# Patient Record
Sex: Male | Born: 1960
Health system: Southern US, Community
[De-identification: ages and names within clinical notes are randomized; demographics above are authoritative.]

## PROBLEM LIST (undated history)

## (undated) DIAGNOSIS — R112 Nausea with vomiting, unspecified: Secondary | ICD-10-CM

## (undated) DIAGNOSIS — Z87442 Personal history of urinary calculi: Secondary | ICD-10-CM

## (undated) DIAGNOSIS — T8859XA Other complications of anesthesia, initial encounter: Secondary | ICD-10-CM

## (undated) DIAGNOSIS — Z9889 Other specified postprocedural states: Secondary | ICD-10-CM

## (undated) HISTORY — PX: OTHER SURGICAL HISTORY: SHX169

## (undated) HISTORY — PX: TONSILLECTOMY: SUR1361

---

## 2012-02-18 ENCOUNTER — Encounter: Payer: Self-pay | Admitting: Sports Medicine

## 2012-02-18 ENCOUNTER — Ambulatory Visit (INDEPENDENT_AMBULATORY_CARE_PROVIDER_SITE_OTHER): Payer: BC Managed Care – PPO | Admitting: Sports Medicine

## 2012-02-18 VITALS — BP 127/88 | HR 101 | Ht 70.0 in | Wt 252.0 lb

## 2012-02-18 DIAGNOSIS — B356 Tinea cruris: Secondary | ICD-10-CM

## 2012-02-18 DIAGNOSIS — L255 Unspecified contact dermatitis due to plants, except food: Secondary | ICD-10-CM

## 2012-02-18 DIAGNOSIS — Z139 Encounter for screening, unspecified: Secondary | ICD-10-CM

## 2012-02-18 DIAGNOSIS — Z0000 Encounter for general adult medical examination without abnormal findings: Secondary | ICD-10-CM | POA: Insufficient documentation

## 2012-02-18 DIAGNOSIS — M48061 Spinal stenosis, lumbar region without neurogenic claudication: Secondary | ICD-10-CM | POA: Insufficient documentation

## 2012-02-18 DIAGNOSIS — M5412 Radiculopathy, cervical region: Secondary | ICD-10-CM

## 2012-02-18 DIAGNOSIS — N529 Male erectile dysfunction, unspecified: Secondary | ICD-10-CM | POA: Insufficient documentation

## 2012-02-18 DIAGNOSIS — M5416 Radiculopathy, lumbar region: Secondary | ICD-10-CM

## 2012-02-18 DIAGNOSIS — IMO0002 Reserved for concepts with insufficient information to code with codable children: Secondary | ICD-10-CM

## 2012-02-18 DIAGNOSIS — R7989 Other specified abnormal findings of blood chemistry: Secondary | ICD-10-CM

## 2012-02-18 DIAGNOSIS — E291 Testicular hypofunction: Secondary | ICD-10-CM

## 2012-02-18 DIAGNOSIS — L237 Allergic contact dermatitis due to plants, except food: Secondary | ICD-10-CM | POA: Insufficient documentation

## 2012-02-18 MED ORDER — TADALAFIL 5 MG PO TABS: 5.0000 mg | ORAL_TABLET | Freq: Every day | ORAL | Status: DC | PRN

## 2012-02-18 MED ORDER — TERBINAFINE HCL 250 MG PO TABS: 250.0000 mg | ORAL_TABLET | Freq: Every day | ORAL | Status: DC

## 2012-02-18 MED ORDER — TESTOSTERONE 30 MG/ACT TD SOLN: 60.0000 mg | Freq: Every day | TRANSDERMAL | Status: DC

## 2012-02-18 MED ORDER — PREDNISONE 50 MG PO TABS: ORAL_TABLET | ORAL | Status: AC

## 2012-02-18 MED ORDER — GABAPENTIN 300 MG PO CAPS: ORAL_CAPSULE | ORAL | Status: DC

## 2012-02-18 NOTE — Assessment & Plan Note (Signed)
I've refilled his Axiron. I'm also checking a testosterone level, as well as complete metabolic panel.

## 2012-02-18 NOTE — Assessment & Plan Note (Signed)
That is about this. He'll come back to see me on an as-needed basis for this.

## 2012-02-18 NOTE — Assessment & Plan Note (Signed)
We'll await MRI results, in preparation for transforaminal injection planning. I've given him home rehabilitation exercises to do in the meantime. The prednisone should also help in the meantime.

## 2012-02-18 NOTE — Assessment & Plan Note (Signed)
Patient desires blood work including CBC, complete metabolic panel, we discussed the risks and benefits, as well as the limitations of the PSA test, and he would like to proceed with this. He is up-to-date on his colonoscopy. He is unsure as to whether he's had chickenpox as a child, he is considering the zoster vaccine, we will check varicella-zoster virus titers. Checking lipid panel.

## 2012-02-18 NOTE — Patient Instructions (Addendum)
Great to meet you.  Bloodwork tomorrow. Get your new medications. Come back to see me in 4-6 weeks. Remember to get your MRI records.  Ihor Austin. Benjamin Stain, M.D.

## 2012-02-18 NOTE — Assessment & Plan Note (Signed)
Likely related to his hypogonadism. He's had good response with Cialis. I will refill this for him. He will come back to see me on an as-needed basis for this.

## 2012-02-18 NOTE — Progress Notes (Signed)
Subjective:   CC: Establish care with a new primary physician. Would also like to discuss several issues.  HPI: Richard Gross is a very pleasant 51 year old male who was recently moved to the area, and needs a new primary physician. He would also like to discuss:  Rash on left arm: Present for approximately one month, after working in the yard. He saw another physician, and received attending Dosepak of prednisone. This worked, however after the prednisone finished, the rash cleared up, he developed an additional rash on his left-sided abdomen. This is predominantly itchy.  Left arm numbness and tingling: Present for approximately one year. Comes down his right arm. He is been to an orthopedist, and has an MRI, and injection recommended. He has not yet follow through with this.  Back pain with right-sided leg numbness and tingling: Also present for approximately one year after swinging a golf club. He then developed intense pain in his right-sided low back radiating down the posterior aspect of his right leg down to the lateral aspect of his right foot. This corresponds to right-sided S1 radicular distribution. He has not had any formal treatment for this.  Low testosterone: Has been using topical testosterone prescribed by another physician. Unfortunately he's had a several month hiatus. He desires a refill, as well as to have his testosterone  Recheck.  Jock itch: Present for several weeks, has been using Lotrimin ultra-without much help. He is desiring additional option.  Preventive medicine: Would like to have some routine blood work done, including a PSA. He would also like to discuss zoster vaccine, but is unsure if he's had chickenpox in the past.   Past medical history: Low testosterone, erectile dysfunction. Surgical history: None. Family history: Positive for cancer, heart attacks, diabetes, hyperlipidemia, hypertension. Social history: Works as an Community education officer. Quit smoking in 1997,  denies use of alcohol or drugs. Allergies, and medications reviewed from the medical record and no changes needed.  Review of Systems: No fevers, chills, night sweats, weight loss, chest pain, or shortness of breath.    Objective:  General:  Well Developed, well nourished, and in no acute distress. Neuro:  Alert and oriented x3, extra-ocular muscles intact. Skin: Warm and dry, tinea cruris noted. He also has some residual signs of poison ivy dermatitis on his left arm. He also has a large, red macule his left abdomen that blanches. Cardiac: Regular rate and rhythm, no murmurs rubs or gallops. Respiratory:  Clear to auscultation bilaterally. Not using accessory muscles, speaking in full sentences.  Neck: Inspection unremarkable. No palpable stepoffs. Negative Spurling's maneuver. Full neck range of motion Grip strength and sensation normal in bilateral hands Strength good C4 to T1 distribution No sensory change to C4 to T1 Reflexes normal  Back Exam: Inspection: Unremarkable Motion: Flexion 45 deg, Extension 45 deg, Side Bending to 45 deg bilaterally,  Rotation to 45 deg bilaterally SLR laying:  Negative XSLR laying: Negative Palpable tenderness: None FABER: negative Sensory change: Gross sensation intact to all lumbar and sacral dermatomes. Reflexes: 2+ at both patellar tendons, 2+ at achilles tendons, Babinski's downgoing.  Strength at foot Plantar-flexion: 5/5    Dorsi-flexion: 5/5    Eversion: 5/5   Inversion: 5/5 Leg strength Quad: 5/5   Hamstring: 5/5   Hip flexor: 5/5   Hip abductors: 5/5 Gait unremarkable.   Assessment & Plan:

## 2012-02-18 NOTE — Assessment & Plan Note (Signed)
Likely based on combination of symptoms, duration for over year. Prednisone should help some of this. I've given him some rehabilitation exercises which he should do on a daily basis. Like to see him back in 4 weeks. I've also started on Neurontin taper. When he brings me the results of his MRI, at that point we can determine the affected level in preparation for transforaminal epidural injections.

## 2012-02-18 NOTE — Assessment & Plan Note (Signed)
Minimal, to no benefit with topical Lamisil. Checking a complete metabolic panel, and I will start him on oral terbinafine. We will do this for at least 2 weeks, but possibly up to 4 weeks.

## 2012-02-19 LAB — PSA, TOTAL AND FREE
PSA, Free Pct: 21 % — ABNORMAL LOW (ref >25)
PSA, Free: 0.16 ng/mL
PSA: 0.76 ng/mL (ref <=4.00)

## 2012-02-19 LAB — CBC WITH DIFFERENTIAL/PLATELET
Basophils Absolute: 0 10*3/uL (ref 0.0–0.1)
Basophils Relative: 1 % (ref 0–1)
Eosinophils Absolute: 0.2 10*3/uL (ref 0.0–0.7)
Eosinophils Relative: 5 % (ref 0–5)
HCT: 43.4 % (ref 39.0–52.0)
Hemoglobin: 15.3 g/dL (ref 13.0–17.0)
Lymphocytes Relative: 18 % (ref 12–46)
Lymphs Abs: 0.9 K/uL (ref 0.7–4.0)
MCH: 30.4 pg (ref 26.0–34.0)
MCHC: 35.3 g/dL (ref 30.0–36.0)
MCV: 86.3 fL (ref 78.0–100.0)
Monocytes Absolute: 0.2 K/uL (ref 0.1–1.0)
Monocytes Relative: 4 % (ref 3–12)
Neutro Abs: 3.7 10*3/uL (ref 1.7–7.7)
Neutrophils Relative %: 72 % (ref 43–77)
Platelets: 204 K/uL (ref 150–400)
RBC: 5.03 MIL/uL (ref 4.22–5.81)
RDW: 14 % (ref 11.5–15.5)
WBC: 5 K/uL (ref 4.0–10.5)

## 2012-02-19 LAB — COMPREHENSIVE METABOLIC PANEL
ALT: 37 U/L (ref 0–53)
AST: 17 U/L (ref 0–37)
Albumin: 4.3 g/dL (ref 3.5–5.2)
Alkaline Phosphatase: 68 U/L (ref 39–117)
BUN: 22 mg/dL (ref 6–23)
Calcium: 9.2 mg/dL (ref 8.4–10.5)
Chloride: 107 mEq/L (ref 96–112)
Potassium: 4.5 mEq/L (ref 3.5–5.3)
Sodium: 140 mEq/L (ref 135–145)
Total Protein: 6 g/dL (ref 6.0–8.3)

## 2012-02-19 LAB — LIPID PANEL
Cholesterol: 233 mg/dL — ABNORMAL HIGH (ref 0–200)
HDL: 33 mg/dL — ABNORMAL LOW (ref >39)
LDL Cholesterol: 124 mg/dL — ABNORMAL HIGH (ref 0–99)
Total CHOL/HDL Ratio: 7.1 Ratio
Triglycerides: 378 mg/dL — ABNORMAL HIGH (ref <150)
VLDL: 76 mg/dL — ABNORMAL HIGH (ref 0–40)

## 2012-02-19 LAB — COMPREHENSIVE METABOLIC PANEL WITH GFR
CO2: 27 meq/L (ref 19–32)
Creat: 0.8 mg/dL (ref 0.50–1.35)
Glucose, Bld: 102 mg/dL — ABNORMAL HIGH (ref 70–99)
Total Bilirubin: 0.4 mg/dL (ref 0.3–1.2)

## 2012-02-20 ENCOUNTER — Encounter: Payer: Self-pay | Admitting: Sports Medicine

## 2012-02-20 LAB — TESTOSTERONE, FREE, TOTAL, SHBG
Sex Hormone Binding: 49 nmol/L (ref 13–71)
Testosterone, Free: 48.3 pg/mL (ref 47.0–244.0)
Testosterone-% Free: 1.5 % — ABNORMAL LOW (ref 1.6–2.9)
Testosterone: 316.1 ng/dL (ref 300–890)

## 2012-02-21 ENCOUNTER — Encounter: Payer: Self-pay | Admitting: Sports Medicine

## 2012-03-24 ENCOUNTER — Ambulatory Visit (INDEPENDENT_AMBULATORY_CARE_PROVIDER_SITE_OTHER): Payer: BC Managed Care – PPO | Admitting: Sports Medicine

## 2012-03-24 ENCOUNTER — Encounter: Payer: Self-pay | Admitting: Sports Medicine

## 2012-03-24 VITALS — BP 114/70 | HR 84 | Temp 97.4°F | Wt 257.0 lb

## 2012-03-24 DIAGNOSIS — Z0000 Encounter for general adult medical examination without abnormal findings: Secondary | ICD-10-CM

## 2012-03-24 DIAGNOSIS — M5412 Radiculopathy, cervical region: Secondary | ICD-10-CM

## 2012-03-24 DIAGNOSIS — M5416 Radiculopathy, lumbar region: Secondary | ICD-10-CM

## 2012-03-24 DIAGNOSIS — E785 Hyperlipidemia, unspecified: Secondary | ICD-10-CM

## 2012-03-24 DIAGNOSIS — IMO0002 Reserved for concepts with insufficient information to code with codable children: Secondary | ICD-10-CM

## 2012-03-24 DIAGNOSIS — E291 Testicular hypofunction: Secondary | ICD-10-CM

## 2012-03-24 MED ORDER — SIMVASTATIN 40 MG PO TABS: 40.0000 mg | ORAL_TABLET | Freq: Every day | ORAL | Status: DC

## 2012-03-24 NOTE — Assessment & Plan Note (Addendum)
MRI was done previously at [facility], [place]. We will obtain these results, and I would use them for interventional injection planning.

## 2012-03-24 NOTE — Assessment & Plan Note (Signed)
Starting simvastatin 40 mg daily. He will come back to see me in 3 months to recheck.

## 2012-03-24 NOTE — Assessment & Plan Note (Signed)
Awaiting prior authorization for Axiron.

## 2012-03-24 NOTE — Assessment & Plan Note (Addendum)
MRI was done previously at [facility], [place]. Again, we'll obtain MRI results, for interventional injection planning.

## 2012-03-24 NOTE — Assessment & Plan Note (Signed)
The lab missed his VZV antibody. When he gets his lipid panel, I would like also draw his varicella-zoster antibody at no charge.

## 2012-03-24 NOTE — Progress Notes (Signed)
Patient ID: Richard Gross, male   DOB: Jul 15, 1961, 51 y.o.   MRN: [id number] Subjective:    CC: Followup  HPI:  Hypogonadism:  Axiron in the past, we are currently getting prior authorization for this.  Hyperlipidemia: Was on TriCor in the past.  Poison ivy dermatitis: Resolved.  Tinea cruris: Resolved with Lamisil.  Cervical, and lumbar radiculopathy:  Currently still awaiting results from his cervical and lumbar MRIs.  The gabapentin is helping, he is currently only on 300 mg 3 times a day. He will continue his up taper, he is unsure whether he is getting some drowsiness.   Past medical history, Surgical history, Family history, Social history, Allergies, and medications have been entered into the medical record, reviewed, and no changes needed.   Review of Systems: No fevers, chills, night sweats, weight loss, chest pain, or shortness of breath.   Objective:    General: Well Developed, well nourished, and in no acute distress.  Neuro: Alert and oriented x3, extra-ocular muscles intact.  HEENT: Normocephalic, atraumatic, pupils equal round reactive to light, neck supple, no masses, no lymphadenopathy, thyroid nonpalpable.  Skin: Warm and dry, no rashes. Cardiac: Regular rate and rhythm, no murmurs rubs or gallops.  Respiratory: Clear to auscultation bilaterally. Not using accessory muscles, speaking in full sentences.  Impression and Recommendations:

## 2012-03-25 ENCOUNTER — Telehealth: Payer: Self-pay

## 2012-03-25 NOTE — Telephone Encounter (Signed)
I have faxed over the medical release forms to both offices.

## 2012-03-25 NOTE — Telephone Encounter (Signed)
FYI  Dr Benjamin Stain I am still working on the MRI reports and Disks for Mr. Knoble. See note below.   I called Jaishon and asked him if he has signed a Medical record release form. He is not sure if he has signed a release form.  I am unable to get the MRI report and disk from [facility], [facility] or MRI from [facility], [place] without a release.   [facility] phone number is [phone] and fax is [phone]  [facility] phone number is [phone] and fax is [phone]

## 2012-03-25 NOTE — Telephone Encounter (Signed)
I spoke with Richard Gross and he stated he would come by to sign a release.

## 2012-03-25 NOTE — Telephone Encounter (Signed)
Opened in error

## 2012-03-26 ENCOUNTER — Telehealth: Payer: Self-pay

## 2012-03-26 NOTE — Telephone Encounter (Signed)
Awaiting baseline testosterone serum results. This lab is needed for PA on the Axiron. Paperwork in a file on my desk.

## 2012-03-31 ENCOUNTER — Other Ambulatory Visit: Payer: Self-pay | Admitting: *Deleted

## 2012-03-31 ENCOUNTER — Encounter: Payer: Self-pay | Admitting: Sports Medicine

## 2012-03-31 MED ORDER — MELOXICAM 15 MG PO TABS: ORAL_TABLET | ORAL | Status: DC

## 2012-03-31 NOTE — Telephone Encounter (Signed)
LMOM

## 2012-03-31 NOTE — Telephone Encounter (Signed)
Is it ok to refill pt's Ibuprofen 300mg  daily? We have never filled it before for him.

## 2012-03-31 NOTE — Telephone Encounter (Signed)
Let's do MOBIC instead. I will call this in.

## 2012-06-20 ENCOUNTER — Telehealth: Payer: Self-pay | Admitting: *Deleted

## 2012-06-20 ENCOUNTER — Other Ambulatory Visit: Payer: Self-pay | Admitting: Sports Medicine

## 2012-06-20 DIAGNOSIS — Z0184 Encounter for antibody response examination: Secondary | ICD-10-CM

## 2012-06-20 DIAGNOSIS — E785 Hyperlipidemia, unspecified: Secondary | ICD-10-CM

## 2012-06-20 LAB — LIPID PANEL
Cholesterol: 137 mg/dL (ref 0–200)
HDL: 34 mg/dL — ABNORMAL LOW (ref >39)
LDL Cholesterol: 56 mg/dL (ref 0–99)
Total CHOL/HDL Ratio: 4 ratio
Triglycerides: 236 mg/dL — ABNORMAL HIGH (ref <150)
VLDL: 47 mg/dL — ABNORMAL HIGH (ref 0–40)

## 2012-06-20 MED ORDER — NIACIN-SIMVASTATIN ER 1000-40 MG PO TB24: 1.0000 | ORAL_TABLET | Freq: Every day | ORAL | Status: DC

## 2012-06-20 NOTE — Assessment & Plan Note (Signed)
Triglycerides are still somewhat elevated, I'm going to keep him on simvastatin, but switch this to the combination simvastatin/niacin combination. We will recheck lipids in 6 weeks.

## 2012-06-23 ENCOUNTER — Encounter: Payer: Self-pay | Admitting: Sports Medicine

## 2012-06-23 ENCOUNTER — Ambulatory Visit (INDEPENDENT_AMBULATORY_CARE_PROVIDER_SITE_OTHER): Payer: BC Managed Care – PPO | Admitting: Sports Medicine

## 2012-06-23 VITALS — BP 137/83 | HR 85 | Wt 262.0 lb

## 2012-06-23 DIAGNOSIS — M5412 Radiculopathy, cervical region: Secondary | ICD-10-CM

## 2012-06-23 DIAGNOSIS — E291 Testicular hypofunction: Secondary | ICD-10-CM

## 2012-06-23 DIAGNOSIS — IMO0002 Reserved for concepts with insufficient information to code with codable children: Secondary | ICD-10-CM

## 2012-06-23 DIAGNOSIS — M5416 Radiculopathy, lumbar region: Secondary | ICD-10-CM

## 2012-06-23 DIAGNOSIS — E785 Hyperlipidemia, unspecified: Secondary | ICD-10-CM

## 2012-06-23 LAB — VARICELLA ZOSTER ANTIBODY, IGG: Varicella IgG: 4.75 {ISR} — ABNORMAL HIGH (ref <0.90)

## 2012-06-23 MED ORDER — CYCLOBENZAPRINE HCL 10 MG PO TABS: ORAL_TABLET | ORAL | Status: DC

## 2012-06-23 NOTE — Assessment & Plan Note (Signed)
He has an MRI, we do not have the results or the disc yet. I would like him to do some formal physical therapy, and continue gabapentin, as well as Flexeril at night. We will see him back after 4 weeks, and we'll hopefully obtain MRI results by then.

## 2012-06-23 NOTE — Progress Notes (Signed)
Subjective:    CC: Followup  HPI: Male hypogonadism: has been on Axiron in the past, and had excellent response. Most recent testosterone levels were in the 300s. We have been working on a prior authorization, however she has not yet been able to secure any more Axiron.  Hyperlipidemia: Has been on simvastatin 40, most recent lipid panel showed high triglycerides, and low HDL.  Erectile dysfunction: Has been related to hypogonadism, and was better when he was on testosterone supplementation.  Cervical radiculopathy colon we did review his MRI, he does have a left-sided C6-C7 foraminal disc herniation.  He's been doing very well with gabapentin, however occasionally still wakes up with a stiff neck.  Lumbar radiculitis: We have still not yet gotten results of his lumbar spine MRI. The gabapentin does not improve his pain.  Past medical history, Surgical history, Family history, Social history, Allergies, and medications have been entered into the medical record, reviewed, and no changes needed.   Review of Systems: No fevers, chills, night sweats, weight loss, chest pain, or shortness of breath.   Objective:    General: Well Developed, well nourished, and in no acute distress.  Neuro: Alert and oriented x3, extra-ocular muscles intact.  HEENT: Normocephalic, atraumatic, pupils equal round reactive to light, neck supple, no masses, no lymphadenopathy, thyroid nonpalpable.  Skin: Warm and dry, no rashes. Cardiac: Regular rate and rhythm, no murmurs rubs or gallops.  Respiratory: Clear to auscultation bilaterally. Not using accessory muscles, speaking in full sentences.  Impression and Recommendations:

## 2012-06-23 NOTE — Assessment & Plan Note (Signed)
HDL low, LDL normal, triglycerides high. I am switching him to simvastatin/niacin combination. We'll recheck his lipids in 6 weeks.

## 2012-06-23 NOTE — Assessment & Plan Note (Signed)
He is doing well with gabapentin. I am going to add cyclobenzaprine one tablet bedtime. He has been waking up with a stiff neck.

## 2012-06-23 NOTE — Assessment & Plan Note (Signed)
Still awaiting prior authorization for Axiron.

## 2012-06-25 ENCOUNTER — Ambulatory Visit: Payer: BC Managed Care – PPO | Admitting: Physical Therapy

## 2012-06-27 ENCOUNTER — Telehealth: Payer: Self-pay | Admitting: *Deleted

## 2012-06-27 NOTE — Telephone Encounter (Signed)
Spoke with pt and he states he will have his wife to contact them to send a disk.

## 2012-06-27 NOTE — Telephone Encounter (Signed)
Have tried calling [facility] in [place] to get a copy of the pt's lumbar spine MRI. Have been unsuccessful in getting return calls. Will call pt and ask him to give them a call.

## 2012-07-31 ENCOUNTER — Other Ambulatory Visit: Payer: Self-pay | Admitting: Sports Medicine

## 2012-09-09 ENCOUNTER — Encounter: Payer: Self-pay | Admitting: Sports Medicine

## 2012-09-09 ENCOUNTER — Other Ambulatory Visit: Payer: Self-pay | Admitting: Sports Medicine

## 2012-09-09 ENCOUNTER — Ambulatory Visit (INDEPENDENT_AMBULATORY_CARE_PROVIDER_SITE_OTHER): Payer: BC Managed Care – PPO | Admitting: Sports Medicine

## 2012-09-09 VITALS — BP 127/83 | HR 99 | Temp 98.2°F | Wt 266.0 lb

## 2012-09-09 DIAGNOSIS — J0100 Acute maxillary sinusitis, unspecified: Secondary | ICD-10-CM

## 2012-09-09 DIAGNOSIS — E785 Hyperlipidemia, unspecified: Secondary | ICD-10-CM

## 2012-09-09 MED ORDER — PREDNISONE 50 MG PO TABS: 50.0000 mg | ORAL_TABLET | Freq: Every day | ORAL | Status: DC

## 2012-09-09 MED ORDER — AZITHROMYCIN 500 MG PO TABS: 500.0000 mg | ORAL_TABLET | Freq: Every day | ORAL | Status: DC

## 2012-09-09 NOTE — Assessment & Plan Note (Signed)
Azithromycin three-day per patient request. Prednisone instead of Flonase per patient request. Return as needed.

## 2012-09-09 NOTE — Assessment & Plan Note (Signed)
Still awaiting fasting lipids. She will come in at his next available opportunity to have blood drawn.

## 2012-09-09 NOTE — Progress Notes (Signed)
Subjective:    CC: Sick  HPI:  Sinusitis:  Presten comes in he said several days of cough productive of yellowish sputum, pain and pressure over his maxillary sinuses without radiation to the teeth or ears, generalized malaise and fatigue. He does have a trip coming up and would like does knocked out. He denies shortness of breath, wheeze. Denies GI symptoms or rash.  Hyperlipidemia:  Due for recheck.  Past medical history, Surgical history, Family history not pertinant except as noted below, Social history, Allergies, and medications have been entered into the medical record, reviewed, and no changes needed.   Review of Systems: No fevers, chills, night sweats, weight loss, chest pain, or shortness of breath.   Objective:    General: Well Developed, well nourished, and in no acute distress.  Neuro: Alert and oriented x3, extra-ocular muscles intact, sensation grossly intact.  HEENT: Normocephalic, atraumatic, pupils equal round reactive to light, neck supple, no masses, no lymphadenopathy, thyroid nonpalpable. Tender to palpation of the maxillary sinuses, oropharynx, nasopharynx, external ear canals are unremarkable to inspection Skin: Warm and dry, no rashes. Cardiac: Regular rate and rhythm, no murmurs rubs or gallops.  Respiratory: Clear to auscultation bilaterally. Not using accessory muscles, speaking in full sentences. Impression and Recommendations:

## 2012-10-10 LAB — LIPID PANEL
Cholesterol: 133 mg/dL (ref 0–200)
HDL: 34 mg/dL — ABNORMAL LOW (ref >39)
LDL Cholesterol: 58 mg/dL (ref 0–99)
Total CHOL/HDL Ratio: 3.9 Ratio
Triglycerides: 207 mg/dL — ABNORMAL HIGH (ref <150)
VLDL: 41 mg/dL — ABNORMAL HIGH (ref 0–40)

## 2012-10-10 LAB — COMPREHENSIVE METABOLIC PANEL
AST: 22 U/L (ref 0–37)
Albumin: 4.2 g/dL (ref 3.5–5.2)
Alkaline Phosphatase: 56 U/L (ref 39–117)
BUN: 18 mg/dL (ref 6–23)
Potassium: 4.3 mEq/L (ref 3.5–5.3)
Sodium: 142 mEq/L (ref 135–145)
Total Bilirubin: 0.4 mg/dL (ref 0.3–1.2)

## 2012-10-10 LAB — COMPREHENSIVE METABOLIC PANEL WITH GFR
ALT: 36 U/L (ref 0–53)
CO2: 26 meq/L (ref 19–32)
Calcium: 9.2 mg/dL (ref 8.4–10.5)
Chloride: 107 meq/L (ref 96–112)
Creat: 0.93 mg/dL (ref 0.50–1.35)
Glucose, Bld: 94 mg/dL (ref 70–99)
Total Protein: 6 g/dL (ref 6.0–8.3)

## 2012-10-12 ENCOUNTER — Other Ambulatory Visit: Payer: Self-pay | Admitting: Sports Medicine

## 2012-10-12 DIAGNOSIS — E785 Hyperlipidemia, unspecified: Secondary | ICD-10-CM

## 2012-10-12 MED ORDER — NIACIN-SIMVASTATIN ER 1000-40 MG PO TB24: 2.0000 | ORAL_TABLET | Freq: Every day | ORAL | Status: DC

## 2012-10-13 LAB — TESTOSTERONE, FREE, TOTAL, SHBG
Sex Hormone Binding: 52 nmol/L (ref 13–71)
Testosterone, Free: 80.2 pg/mL (ref 47.0–244.0)
Testosterone-% Free: 1.6 % (ref 1.6–2.9)
Testosterone: 514.87 ng/dL (ref 300–890)

## 2012-12-01 ENCOUNTER — Encounter: Payer: Self-pay | Admitting: Sports Medicine

## 2012-12-01 ENCOUNTER — Encounter: Payer: Self-pay | Admitting: *Deleted

## 2012-12-01 ENCOUNTER — Ambulatory Visit (INDEPENDENT_AMBULATORY_CARE_PROVIDER_SITE_OTHER): Payer: BC Managed Care – PPO | Admitting: Sports Medicine

## 2012-12-01 VITALS — BP 137/86 | HR 94 | Wt 263.0 lb

## 2012-12-01 DIAGNOSIS — E669 Obesity, unspecified: Secondary | ICD-10-CM

## 2012-12-01 DIAGNOSIS — G510 Bell's palsy: Secondary | ICD-10-CM

## 2012-12-01 DIAGNOSIS — E785 Hyperlipidemia, unspecified: Secondary | ICD-10-CM

## 2012-12-01 MED ORDER — FENOFIBRATE 145 MG PO TABS: 145.0000 mg | ORAL_TABLET | Freq: Every day | ORAL | Status: DC

## 2012-12-01 MED ORDER — PREDNISONE 50 MG PO TABS: ORAL_TABLET | ORAL | Status: DC

## 2012-12-01 MED ORDER — ARTIFICIAL TEARS OP OINT: TOPICAL_OINTMENT | Freq: Every evening | OPHTHALMIC | Status: DC | PRN

## 2012-12-01 NOTE — Progress Notes (Signed)
  Subjective:    CC: Bell's palsy  HPI: Bell's palsy: approximately one week ago Richard Gross woke up with a droop on the right side of his face involving his forehead. He's never had symptoms like this before, however one of his family members did. He denies any visual changes, weakness or numbness on either side of his body, or difficulty hearing, or speaking. He did have upper respiratory symptoms approximately one week before. He went to an outside urgent care center where he was diagnosed with Bell's palsy, and given prednisone taper for 6 days.  Hyperlipidemia: Has muscle aches with simvastatin.  Obesity: Desires to start weight loss medication.  Past medical history, Surgical history, Family history not pertinant except as noted below, Social history, Allergies, and medications have been entered into the medical record, reviewed, and no changes needed.   Review of Systems: No fevers, chills, night sweats, weight loss, chest pain, or shortness of breath.   Objective:    General: Well Developed, well nourished, and in no acute distress.  Neuro: Alert and oriented x3, extra-ocular muscles intact, sensation grossly intact. There is right-sided facial droop involving the forehead, the rest of the cranial nerves are intact. Motor, sensory, coordinative functions are intact. HEENT: Normocephalic, atraumatic, pupils equal round reactive to light, neck supple, no masses, no lymphadenopathy, thyroid nonpalpable.  Skin: Warm and dry, no rashes. Cardiac: Regular rate and rhythm, no murmurs rubs or gallops, no lower extremity edema.  Respiratory: Clear to auscultation bilaterally. Not using accessory muscles, speaking in full sentences.  Impression and Recommendations:    I spent 40 minutes with this patient, greater than 50% was face-to-face time counseling regarding Bell's palsy.

## 2012-12-01 NOTE — Assessment & Plan Note (Signed)
Myalgias with simvastatin. Switching to fenofibrate. Recheck in 3 months.

## 2012-12-01 NOTE — Assessment & Plan Note (Signed)
Nutritionist visit. Exercise Prescription. Phentermine, but I will not prescribe this until the Bell's palsy is resolved.

## 2012-12-01 NOTE — Assessment & Plan Note (Signed)
Continue prednisone, I want him to pick up his taper after doing an additional 5 day burst. I've recommended an eye patch for use at night. Artificial tears. Return in 2-3 weeks. He understands this can take a few months to completely resolve. There are no signs of vesicles in his ears to suggest Ramsay Hunt syndrome.

## 2012-12-04 ENCOUNTER — Other Ambulatory Visit: Payer: Self-pay | Admitting: *Deleted

## 2012-12-04 MED ORDER — MELOXICAM 15 MG PO TABS: 15.0000 mg | ORAL_TABLET | Freq: Every day | ORAL | Status: DC | PRN

## 2012-12-15 ENCOUNTER — Other Ambulatory Visit: Payer: Self-pay

## 2012-12-15 MED ORDER — GABAPENTIN 300 MG PO CAPS: ORAL_CAPSULE | ORAL | Status: DC

## 2012-12-18 ENCOUNTER — Ambulatory Visit (INDEPENDENT_AMBULATORY_CARE_PROVIDER_SITE_OTHER): Payer: BC Managed Care – PPO | Admitting: Sports Medicine

## 2012-12-18 ENCOUNTER — Encounter: Payer: Self-pay | Admitting: Sports Medicine

## 2012-12-18 VITALS — BP 128/80 | HR 96 | Wt 263.0 lb

## 2012-12-18 DIAGNOSIS — M5412 Radiculopathy, cervical region: Secondary | ICD-10-CM

## 2012-12-18 DIAGNOSIS — E785 Hyperlipidemia, unspecified: Secondary | ICD-10-CM

## 2012-12-18 DIAGNOSIS — G510 Bell's palsy: Secondary | ICD-10-CM

## 2012-12-18 DIAGNOSIS — J0100 Acute maxillary sinusitis, unspecified: Secondary | ICD-10-CM | POA: Insufficient documentation

## 2012-12-18 MED ORDER — AZITHROMYCIN 250 MG PO TABS: ORAL_TABLET | ORAL | Status: DC

## 2012-12-18 MED ORDER — PREDNISONE 10 MG PO TABS: ORAL_TABLET | ORAL | Status: DC

## 2012-12-18 NOTE — Assessment & Plan Note (Signed)
Still with some numbness in the left arm and a C7 distribution. He does have a good response to gabapentin, and is likely going to go up on the dose. He's been taking a lot of medicines lately and would like to delay any further interventional treatment for 3-6 months.

## 2012-12-18 NOTE — Assessment & Plan Note (Signed)
Markedly improved 

## 2012-12-18 NOTE — Assessment & Plan Note (Signed)
Azithromycin, prednisone taper.

## 2012-12-18 NOTE — Progress Notes (Signed)
  Subjective:    CC: Followup  HPI: Bell's palsy: continues to improve on a daily basis after prednisone. He is doing eyedrops to prevent irritation of his cornea. Overall he is happy with the improvement.  Maxillary sinusitis: Pain is localized over the left maxillary sinus, associated with purulent nasal discharge, but no constitutional symptoms. Pain is localized, doesn't radiate, moderate. Has been present for several days.  Hyperlipidemia: Doing well and stable with fenofibrate.  Neck pain: Cervical radiculitis, left-sided, MRI in the past has shown C6-C7 disc protrusion the left side, doing very well with gabapentin.  Past medical history, Surgical history, Family history not pertinant except as noted below, Social history, Allergies, and medications have been entered into the medical record, reviewed, and no changes needed.   Review of Systems: No fevers, chills, night sweats, weight loss, chest pain, or shortness of breath.   Objective:    General: Well Developed, well nourished, and in no acute distress.  Neuro: Alert and oriented x3, extra-ocular muscles intact, sensation grossly intact.  HEENT: Normocephalic, atraumatic, pupils equal round reactive to light, neck supple, no masses, no lymphadenopathy, thyroid nonpalpable. Facial droop has improved significantly. He is tender over the maxillary sinus on the left side, oropharynx, nasopharynx, external ear canals unremarkable. Skin: Warm and dry, no rashes. Cardiac: Regular rate and rhythm, no murmurs rubs or gallops, no lower extremity edema.  Respiratory: Clear to auscultation bilaterally. Not using accessory muscles, speaking in full sentences.  Impression and Recommendations:

## 2012-12-18 NOTE — Assessment & Plan Note (Signed)
No problems at all on fenofibrate.

## 2012-12-26 ENCOUNTER — Ambulatory Visit: Payer: BC Managed Care – PPO | Admitting: Sports Medicine

## 2013-01-26 ENCOUNTER — Telehealth: Payer: Self-pay

## 2013-01-26 NOTE — Telephone Encounter (Signed)
Pt called stated that he was referred to nutrientist he contacted his insurance they do not cover out patient it has to be inpatient with OV.  He wants to know if you can give him another referral. Richard Gross

## 2013-01-30 ENCOUNTER — Ambulatory Visit: Payer: BC Managed Care – PPO | Admitting: Dietician

## 2013-03-11 ENCOUNTER — Other Ambulatory Visit: Payer: Self-pay | Admitting: Sports Medicine

## 2013-03-20 ENCOUNTER — Ambulatory Visit: Payer: BC Managed Care – PPO | Admitting: Sports Medicine

## 2013-03-20 DIAGNOSIS — Z0289 Encounter for other administrative examinations: Secondary | ICD-10-CM

## 2013-03-26 ENCOUNTER — Telehealth: Payer: Self-pay

## 2013-03-26 DIAGNOSIS — G510 Bell's palsy: Secondary | ICD-10-CM

## 2013-03-26 NOTE — Telephone Encounter (Signed)
Patient called wants to know if you can add an order to his labs so he can check to see what his blood type is. Claudean Leavelle,CMA

## 2013-03-26 NOTE — Telephone Encounter (Signed)
Patient called he is getting labs done in the morning he want you to add a test to check to see what is blood type is. Jenney Brester,CMA

## 2013-03-26 NOTE — Telephone Encounter (Signed)
Done, labs ordered

## 2013-03-27 ENCOUNTER — Other Ambulatory Visit: Payer: Self-pay | Admitting: Sports Medicine

## 2013-03-27 LAB — LIPID PANEL
Cholesterol: 186 mg/dL (ref 0–200)
HDL: 38 mg/dL — ABNORMAL LOW (ref >39)
LDL Cholesterol: 111 mg/dL — ABNORMAL HIGH (ref 0–99)
Total CHOL/HDL Ratio: 4.9 ratio
Triglycerides: 185 mg/dL — ABNORMAL HIGH (ref <150)
VLDL: 37 mg/dL (ref 0–40)

## 2013-03-27 MED ORDER — FENOFIBRATE 160 MG PO TABS: 160.0000 mg | ORAL_TABLET | Freq: Every day | ORAL | Status: DC

## 2013-03-27 MED ORDER — OMEGA-3-ACID ETHYL ESTERS 1 G PO CAPS: 2.0000 g | ORAL_CAPSULE | Freq: Two times a day (BID) | ORAL | Status: DC

## 2013-03-28 LAB — ABO AND RH: Rh Type: POSITIVE

## 2013-04-03 ENCOUNTER — Ambulatory Visit (INDEPENDENT_AMBULATORY_CARE_PROVIDER_SITE_OTHER): Payer: BC Managed Care – PPO | Admitting: Sports Medicine

## 2013-04-03 ENCOUNTER — Encounter: Payer: Self-pay | Admitting: Sports Medicine

## 2013-04-03 VITALS — BP 130/83 | HR 107 | Wt 269.0 lb

## 2013-04-03 DIAGNOSIS — M5416 Radiculopathy, lumbar region: Secondary | ICD-10-CM

## 2013-04-03 DIAGNOSIS — E669 Obesity, unspecified: Secondary | ICD-10-CM

## 2013-04-03 DIAGNOSIS — IMO0002 Reserved for concepts with insufficient information to code with codable children: Secondary | ICD-10-CM

## 2013-04-03 DIAGNOSIS — E785 Hyperlipidemia, unspecified: Secondary | ICD-10-CM

## 2013-04-03 DIAGNOSIS — G510 Bell's palsy: Secondary | ICD-10-CM

## 2013-04-03 DIAGNOSIS — M5412 Radiculopathy, cervical region: Secondary | ICD-10-CM

## 2013-04-03 DIAGNOSIS — B351 Tinea unguium: Secondary | ICD-10-CM

## 2013-04-03 MED ORDER — TERBINAFINE HCL 250 MG PO TABS: 250.0000 mg | ORAL_TABLET | Freq: Every day | ORAL | Status: DC

## 2013-04-03 MED ORDER — PHENTERMINE HCL 37.5 MG PO TABS: 37.5000 mg | ORAL_TABLET | Freq: Every day | ORAL | Status: DC

## 2013-04-03 MED ORDER — MAGNESIUM OXIDE 400 MG PO TABS: 800.0000 mg | ORAL_TABLET | Freq: Every day | ORAL | Status: DC

## 2013-04-03 NOTE — Assessment & Plan Note (Signed)
Recently increased fenofibrate to 160 mg, rechecking lipids in 3 months.

## 2013-04-03 NOTE — Assessment & Plan Note (Signed)
Lamisil for 3-6 months, if no better we can remove the entire toenail.

## 2013-04-03 NOTE — Assessment & Plan Note (Signed)
Resolved

## 2013-04-03 NOTE — Assessment & Plan Note (Signed)
Phentermine. He was unable to afford nutritionist visits. Return monthly for refills and weight checks

## 2013-04-03 NOTE — Assessment & Plan Note (Signed)
Patient will obtain MRI of his lumbar spine for epidural planning. I am going to give him some magnesium. Continue gabapentin. If no improvement and to pursue epidural, he does have right-sided likely L5 versus S1 involvement.

## 2013-04-03 NOTE — Progress Notes (Signed)
  Subjective:    CC: Followup  HPI: Bell's palsy: resolved.  Cervical radiculopathy : we reviewed his MRI, he is still steroids, physical therapy, NSAIDs. At this point he does desire to proceed with interventional treatment.  Hypertriglyceridemia: Improved with fenofibrate, recently increased to 160 mg, no changes.  Blood type: He was asking for this to be checked, he is thinking about donating his liver to his brother-in-law.  Toenail thickening: fungal, would like me to treat this.  Obesity: Tried to get in with the nutritionist but this was too expensive for him, wondering if he can still do phentermine.  Past medical history, Surgical history, Family history not pertinant except as noted below, Social history, Allergies, and medications have been entered into the medical record, reviewed, and no changes needed.   Review of Systems: No fevers, chills, night sweats, weight loss, chest pain, or shortness of breath.   Objective:    General: Well Developed, well nourished, and in no acute distress.  Neuro: Alert and oriented x3, extra-ocular muscles intact, sensation grossly intact.  HEENT: Normocephalic, atraumatic, pupils equal round reactive to light, neck supple, no masses, no lymphadenopathy, thyroid nonpalpable.  Skin: Warm and dry, no rashes. Cardiac: Regular rate and rhythm, no murmurs rubs or gallops, no lower extremity edema.  Respiratory: Clear to auscultation bilaterally. Not using accessory muscles, speaking in full sentences. Feet: Onychomycosis bilaterally.  Impression and Recommendations:

## 2013-04-03 NOTE — Assessment & Plan Note (Signed)
Persistent symptoms in the left arm in a C7 distribution. MRI does show a moderate left-sided paracentral and foraminal disc protrusion at the C6-C7 level on the left. I am going to order a left C6-C7 interlaminar epidural steroid injection. Like to see him back about a week or 2 after the injection to evaluate his response. MRI was done at an outside facility, I will send him to the interventional radiologist with his CD.

## 2013-04-10 ENCOUNTER — Telehealth: Payer: Self-pay

## 2013-04-13 NOTE — Telephone Encounter (Signed)
error 

## 2013-04-17 ENCOUNTER — Encounter: Payer: Self-pay | Admitting: Sports Medicine

## 2013-04-29 ENCOUNTER — Telehealth: Payer: Self-pay

## 2013-04-29 DIAGNOSIS — IMO0002 Reserved for concepts with insufficient information to code with codable children: Secondary | ICD-10-CM

## 2013-04-29 NOTE — Telephone Encounter (Signed)
Orders placed for left C6-C7 interlaminar epidural steroid injection and right-sided L5-S1 interlaminar epidural steroid injection. MRI report for the lumbar spine has been scanned in, he should have a CD for his cervical spine MRI. It is also okay to do whichever epidural you want to do first.

## 2013-04-29 NOTE — Telephone Encounter (Signed)
Left message on Danyelle vm with about instructions for epidural injection. Rhonda Cunningham,CMA

## 2013-04-29 NOTE — Telephone Encounter (Signed)
Danyell from GI called want to know if PCP can put order in for lumbar epidural. She also wants to know if patient can go ahead and start on one of the epidural while he is trying to obtain MRI report for Cervical spine? Emmanuella Mirante,CMA

## 2013-05-01 ENCOUNTER — Inpatient Hospital Stay: Admission: RE | Admit: 2013-05-01 | Payer: BC Managed Care – PPO | Source: Ambulatory Visit

## 2013-05-01 ENCOUNTER — Other Ambulatory Visit: Payer: Self-pay | Admitting: Sports Medicine

## 2013-05-01 ENCOUNTER — Ambulatory Visit (INDEPENDENT_AMBULATORY_CARE_PROVIDER_SITE_OTHER): Payer: BC Managed Care – PPO | Admitting: Sports Medicine

## 2013-05-01 VITALS — BP 140/88 | HR 107 | Wt 260.0 lb

## 2013-05-01 DIAGNOSIS — E669 Obesity, unspecified: Secondary | ICD-10-CM

## 2013-05-01 DIAGNOSIS — M5412 Radiculopathy, cervical region: Secondary | ICD-10-CM

## 2013-05-01 MED ORDER — PHENTERMINE HCL 37.5 MG PO TABS: 37.5000 mg | ORAL_TABLET | Freq: Every day | ORAL | Status: DC

## 2013-05-01 NOTE — Assessment & Plan Note (Signed)
9 pound weight loss in one month. Refilling medications return in 1 month.

## 2013-05-01 NOTE — Progress Notes (Signed)
  Subjective:    CC: Followup  HPI: Obesity: 9 pound weight loss in the last month, no side effects.  Onychomycosis: Stable on Lamisil.  Degenerative disc disease: Has an epidural scheduled, will follow up with me afterwards.  Bell's palsy: Resolving.  Past medical history, Surgical history, Family history not pertinant except as noted below, Social history, Allergies, and medications have been entered into the medical record, reviewed, and no changes needed.   Review of Systems: No fevers, chills, night sweats, weight loss, chest pain, or shortness of breath.   Objective:    General: Well Developed, well nourished, and in no acute distress.  Neuro: Alert and oriented x3, extra-ocular muscles intact, sensation grossly intact.  HEENT: Normocephalic, atraumatic, pupils equal round reactive to light, neck supple, no masses, no lymphadenopathy, thyroid nonpalpable. Very mild residual left mouth droop. Skin: Warm and dry, no rashes. Cardiac: Regular rate and rhythm, no murmurs rubs or gallops, no lower extremity edema.  Respiratory: Clear to auscultation bilaterally. Not using accessory muscles, speaking in full sentences.  Impression and Recommendations:

## 2013-05-29 ENCOUNTER — Ambulatory Visit (INDEPENDENT_AMBULATORY_CARE_PROVIDER_SITE_OTHER): Payer: BC Managed Care – PPO | Admitting: Sports Medicine

## 2013-05-29 ENCOUNTER — Encounter: Payer: Self-pay | Admitting: Sports Medicine

## 2013-05-29 VITALS — BP 153/85 | HR 113 | Wt 249.0 lb

## 2013-05-29 DIAGNOSIS — M5412 Radiculopathy, cervical region: Secondary | ICD-10-CM

## 2013-05-29 DIAGNOSIS — M5416 Radiculopathy, lumbar region: Secondary | ICD-10-CM

## 2013-05-29 DIAGNOSIS — E669 Obesity, unspecified: Secondary | ICD-10-CM

## 2013-05-29 DIAGNOSIS — IMO0002 Reserved for concepts with insufficient information to code with codable children: Secondary | ICD-10-CM

## 2013-05-29 MED ORDER — PHENTERMINE HCL 37.5 MG PO TABS: 37.5000 mg | ORAL_TABLET | Freq: Every day | ORAL | Status: DC

## 2013-05-29 NOTE — Assessment & Plan Note (Signed)
Excellent weight loss. Refilling phentermine.

## 2013-05-29 NOTE — Progress Notes (Signed)
  Subjective:    CC: Followup  HPI: Obesity: Has lost an additional 11 pounds since last visit, no adverse effects.  Cervical radiculitis: Resolved with gabapentin.  Lumbar radiculitis: Has his epidural scheduled.  Past medical history, Surgical history, Family history not pertinant except as noted below, Social history, Allergies, and medications have been entered into the medical record, reviewed, and no changes needed.   Review of Systems: No fevers, chills, night sweats, weight loss, chest pain, or shortness of breath.   Objective:    General: Well Developed, well nourished, and in no acute distress.  Neuro: Alert and oriented x3, extra-ocular muscles intact, sensation grossly intact.  HEENT: Normocephalic, atraumatic, pupils equal round reactive to light, neck supple, no masses, no lymphadenopathy, thyroid nonpalpable.  Skin: Warm and dry, no rashes. Cardiac: Regular rate and rhythm, no murmurs rubs or gallops, no lower extremity edema.  Respiratory: Clear to auscultation bilaterally. Not using accessory muscles, speaking in full sentences.  Impression and Recommendations:

## 2013-05-29 NOTE — Assessment & Plan Note (Signed)
He has scheduled his epidural, he will followup with [place] one week afterwards.

## 2013-05-29 NOTE — Assessment & Plan Note (Signed)
Pain-free with gabapentin. Does not need cervical epidural.

## 2013-06-07 ENCOUNTER — Other Ambulatory Visit: Payer: Self-pay | Admitting: Sports Medicine

## 2013-06-08 ENCOUNTER — Ambulatory Visit
Admission: RE | Admit: 2013-06-08 | Discharge: 2013-06-08 | Disposition: A | Discharge status: Home / Self Care | Payer: BC Managed Care – PPO | Source: Ambulatory Visit | Attending: Sports Medicine | Admitting: Sports Medicine

## 2013-06-08 VITALS — BP 147/79 | HR 88

## 2013-06-08 DIAGNOSIS — M5416 Radiculopathy, lumbar region: Secondary | ICD-10-CM

## 2013-06-08 MED ORDER — METHYLPREDNISOLONE ACETATE 40 MG/ML INJ SUSP (RADIOLOG
120.0000 mg | Freq: Once | INTRAMUSCULAR | Status: AC
  Administered 2013-06-08: 120 mg via EPIDURAL

## 2013-06-08 MED ORDER — IOHEXOL 180 MG/ML  SOLN
1.0000 mL | Freq: Once | INTRAMUSCULAR | Status: AC | PRN
  Administered 2013-06-08: 1 mL via EPIDURAL

## 2013-06-26 ENCOUNTER — Encounter: Payer: Self-pay | Admitting: Sports Medicine

## 2013-06-26 ENCOUNTER — Ambulatory Visit (INDEPENDENT_AMBULATORY_CARE_PROVIDER_SITE_OTHER): Payer: BC Managed Care – PPO | Admitting: Sports Medicine

## 2013-06-26 VITALS — BP 144/85 | HR 99 | Wt 239.0 lb

## 2013-06-26 DIAGNOSIS — M5416 Radiculopathy, lumbar region: Secondary | ICD-10-CM

## 2013-06-26 DIAGNOSIS — IMO0002 Reserved for concepts with insufficient information to code with codable children: Secondary | ICD-10-CM

## 2013-06-26 DIAGNOSIS — M5412 Radiculopathy, cervical region: Secondary | ICD-10-CM

## 2013-06-26 DIAGNOSIS — E669 Obesity, unspecified: Secondary | ICD-10-CM

## 2013-06-26 MED ORDER — PHENTERMINE HCL 37.5 MG PO TABS: 18.7500 mg | ORAL_TABLET | Freq: Two times a day (BID) | ORAL | Status: DC

## 2013-06-26 NOTE — Progress Notes (Signed)
  Subjective:    CC: Followup  HPI: Obesity: Has had an additional 10 pound weight loss.  Obstructive uropathy: since starting phentermine has noted that he has some difficulty urinating although he is able to. No problems with erections.  Cervical radiculopathy: only has minimal pain. Controlled with gabapentin.  Lumbar degenerative disc disease: Multilevel, L2-L3, L3-L4, L4-L5, and worse at the L5-S1 levels. More recently we did obtain a right-sided L5-S1 interlaminar epidural steroid injection. He returns with all radicular symptoms resolve, but fortunately he still has some axial pain higher up than the injection site. This pain is worse with slight extension, and more suggestive of facet mediated pain.  Past medical history, Surgical history, Family history not pertinant except as noted below, Social history, Allergies, and medications have been entered into the medical record, reviewed, and no changes needed.   Review of Systems: No fevers, chills, night sweats, weight loss, chest pain, or shortness of breath.   Objective:    General: Well Developed, well nourished, and in no acute distress.  Neuro: Alert and oriented x3, extra-ocular muscles intact, sensation grossly intact.  HEENT: Normocephalic, atraumatic, pupils equal round reactive to light, neck supple, no masses, no lymphadenopathy, thyroid nonpalpable.  Skin: Warm and dry, no rashes. Cardiac: Regular rate and rhythm, no murmurs rubs or gallops, no lower extremity edema.  Respiratory: Clear to auscultation bilaterally. Not using accessory muscles, speaking in full sentences.  Impression and Recommendations:

## 2013-06-26 NOTE — Assessment & Plan Note (Signed)
Radicular symptoms have completely resolved with epidural injection. He does have some pain at a slightly higher level, and on his MRI he does have spondylosis at the L1-L2 as well as L2-L3 levels. He also has severe bilateral lateral recess stenosis at the L3-L4 level with moderate facet arthrosis. Certainly if he desires further epidurals we could go to the L4-L5 through the L3-L4 levels. Continue gabapentin, double the dose. He did have some cramping when he came off of the magnesium, I have asked him to restart it.

## 2013-06-26 NOTE — Assessment & Plan Note (Signed)
Slightly worsening. Double gabapentin.

## 2013-06-26 NOTE — Assessment & Plan Note (Signed)
Additional 10 pound weight loss. He is getting some obstructive urinary symptoms I have asked him to break his phentermine in half. He continues to have these symptoms, we can certainly pursue further evaluation of his prostate.

## 2013-07-13 ENCOUNTER — Encounter: Payer: Self-pay | Admitting: Sports Medicine

## 2013-07-13 ENCOUNTER — Ambulatory Visit (INDEPENDENT_AMBULATORY_CARE_PROVIDER_SITE_OTHER): Payer: BC Managed Care – PPO | Admitting: Sports Medicine

## 2013-07-13 VITALS — BP 131/86 | HR 99 | Temp 98.0°F | Wt 236.0 lb

## 2013-07-13 DIAGNOSIS — B351 Tinea unguium: Secondary | ICD-10-CM

## 2013-07-13 DIAGNOSIS — J111 Influenza due to unidentified influenza virus with other respiratory manifestations: Secondary | ICD-10-CM

## 2013-07-13 LAB — POCT INFLUENZA A/B: Influenza A, POC: NEGATIVE

## 2013-07-13 MED ORDER — KETOROLAC TROMETHAMINE 30 MG/ML IJ SOLN
30.0000 mg | Freq: Once | INTRAMUSCULAR | Status: AC
  Administered 2013-07-13: 30 mg via INTRAMUSCULAR

## 2013-07-13 MED ORDER — TERBINAFINE HCL 250 MG PO TABS: 250.0000 mg | ORAL_TABLET | Freq: Every day | ORAL | Status: DC

## 2013-07-13 NOTE — Assessment & Plan Note (Signed)
No improvement after 3 months of Lamisil, we will do an additional 3 months.

## 2013-07-13 NOTE — Patient Instructions (Signed)
Influenza, Adult °Influenza ("the flu") is a viral infection of the respiratory tract. It occurs more often in winter months because people spend more time in close contact with one another. Influenza can make you feel very sick. Influenza easily spreads from person to person (contagious). °CAUSES  °Influenza is caused by a virus that infects the respiratory tract. You can catch the virus by breathing in droplets from an infected person's cough or sneeze. You can also catch the virus by touching something that was recently contaminated with the virus and then touching your mouth, nose, or eyes. °SYMPTOMS  °Symptoms typically last 4 to 10 days and may include: °· Fever. °· Chills. °· Headache, body aches, and muscle aches. °· Sore throat. °· Chest discomfort and cough. °· Poor appetite. °· Weakness or feeling tired. °· Dizziness. °· Nausea or vomiting. °DIAGNOSIS  °Diagnosis of influenza is often made based on your history and a physical exam. A nose or throat swab test can be done to confirm the diagnosis. °RISKS AND COMPLICATIONS °You may be at risk for a more severe case of influenza if you smoke cigarettes, have diabetes, have chronic heart disease (such as heart failure) or lung disease (such as asthma), or if you have a weakened immune system. Elderly people and pregnant women are also at risk for more serious infections. The most common complication of influenza is a lung infection (pneumonia). Sometimes, this complication can require emergency medical care and may be life-threatening. °PREVENTION  °An annual influenza vaccination (flu shot) is the best way to avoid getting influenza. An annual flu shot is now routinely recommended for all adults in the [place] °TREATMENT  °In mild cases, influenza goes away on its own. Treatment is directed at relieving symptoms. For more severe cases, your caregiver may prescribe antiviral medicines to shorten the sickness. Antibiotic medicines are not effective, because the  infection is caused by a virus, not by bacteria. °HOME CARE INSTRUCTIONS °· Only take over-the-counter or prescription medicines for pain, discomfort, or fever as directed by your caregiver. °· Use a cool mist humidifier to make breathing easier. °· Get plenty of rest until your temperature returns to normal. This usually takes 3 to 4 days. °· Drink enough fluids to keep your urine clear or pale yellow. °· Cover your mouth and nose when coughing or sneezing, and wash your hands well to avoid spreading the virus. °· Stay home from work or school until your fever has been gone for at least 1 full day. °SEEK MEDICAL CARE IF:  °· You have chest pain or a deep cough that worsens or produces more mucus. °· You have nausea, vomiting, or diarrhea. °SEEK IMMEDIATE MEDICAL CARE IF:  °· You have difficulty breathing, shortness of breath, or your skin or nails turn bluish. °· You have severe neck pain or stiffness. °· You have a severe headache, facial pain, or earache. °· You have a worsening or recurring fever. °· You have nausea or vomiting that cannot be controlled. °MAKE SURE YOU: °· Understand these instructions. °· Will watch your condition. °· Will get help right away if you are not doing well or get worse. °Document Released: 06/29/2000 Document Revised: 01/01/2012 Document Reviewed: 10/01/2011 °[employer]® Patient Information ©2014 [employer]. ° °

## 2013-07-13 NOTE — Progress Notes (Signed)
  Subjective:    CC: Body aches  HPI: Unfortunately Richard Gross's wife was diagnosed last week with influenza A with a positive test. Now he's had muscle aches, body aches worse in the low back and neck, with occasional dizziness. He denies any fevers, chills, only a mild cough, no sore throat, no sinus pressure colored nasal discharge. No skin rash, no GI symptoms. Symptoms are moderate, persistent.  Onychomycosis: Has finished 3 months of Lamisil and does not note any improvement in his toenails yet.  Past medical history, Surgical history, Family history not pertinant except as noted below, Social history, Allergies, and medications have been entered into the medical record, reviewed, and no changes needed.   Review of Systems: No fevers, chills, night sweats, weight loss, chest pain, or shortness of breath.   Objective:    General: Well Developed, well nourished, and in no acute distress.  Neuro: Alert and oriented x3, extra-ocular muscles intact, sensation grossly intact.  HEENT: Normocephalic, atraumatic, pupils equal round reactive to light, neck supple, no masses, no lymphadenopathy, thyroid nonpalpable. Oropharynx, nasopharynx, external ear canals are unremarkable. Skin: Warm and dry, no rashes. Cardiac: Regular rate and rhythm, no murmurs rubs or gallops, no lower extremity edema.  Respiratory: Clear to auscultation bilaterally. Not using accessory muscles, speaking in full sentences.  Ketorolac 30 mg intramuscular given in the office.  Rapid flu test is negative. Impression and Recommendations:

## 2013-07-13 NOTE — Assessment & Plan Note (Addendum)
Unfortunately too late for Tamiflu, we will treat him symptomatically. Toradol 30 mg intramuscular, Tylenol over-the-counter, continue Mobic.

## 2013-07-18 ENCOUNTER — Encounter: Payer: Self-pay | Admitting: Sports Medicine

## 2013-07-24 ENCOUNTER — Ambulatory Visit (INDEPENDENT_AMBULATORY_CARE_PROVIDER_SITE_OTHER): Payer: BC Managed Care – PPO | Admitting: Sports Medicine

## 2013-07-24 ENCOUNTER — Encounter: Payer: Self-pay | Admitting: Sports Medicine

## 2013-07-24 VITALS — BP 133/79 | HR 90 | Wt 234.0 lb

## 2013-07-24 DIAGNOSIS — J111 Influenza due to unidentified influenza virus with other respiratory manifestations: Secondary | ICD-10-CM

## 2013-07-24 DIAGNOSIS — E669 Obesity, unspecified: Secondary | ICD-10-CM

## 2013-07-24 DIAGNOSIS — R69 Illness, unspecified: Secondary | ICD-10-CM

## 2013-07-24 DIAGNOSIS — G510 Bell's palsy: Secondary | ICD-10-CM

## 2013-07-24 DIAGNOSIS — B351 Tinea unguium: Secondary | ICD-10-CM

## 2013-07-24 MED ORDER — PHENTERMINE HCL 37.5 MG PO CAPS: 37.5000 mg | ORAL_CAPSULE | ORAL | Status: DC

## 2013-07-24 MED ORDER — TERBINAFINE HCL 250 MG PO TABS
250.0000 mg | ORAL_TABLET | Freq: Every day | ORAL | Status: DC
Start: 2013-07-24 — End: 2014-01-22

## 2013-07-24 NOTE — Assessment & Plan Note (Signed)
Bell's palsy has completely resolved 8 months after diagnosis.

## 2013-07-24 NOTE — Assessment & Plan Note (Signed)
Persistent symptoms after 3 months of Lamisil, refilling.

## 2013-07-24 NOTE — Assessment & Plan Note (Signed)
Continued weight loss. Refilling phentermine. Return in one month.

## 2013-07-24 NOTE — Progress Notes (Signed)
  Subjective:    CC: Followup  HPI: Obesity: Continued weight loss, desires to continue phentermine.  Bell's palsy colon completely resolved now 8 months after diagnosis.  Onychomycosis: No improvement after 3 months of Lamisil, desires to complete a six-month course.  Past medical history, Surgical history, Family history not pertinant except as noted below, Social history, Allergies, and medications have been entered into the medical record, reviewed, and no changes needed.   Review of Systems: No fevers, chills, night sweats, weight loss, chest pain, or shortness of breath.   Objective:    General: Well Developed, well nourished, and in no acute distress.  Neuro: Alert and oriented x3, extra-ocular muscles intact, sensation grossly intact.  HEENT: Normocephalic, atraumatic, pupils equal round reactive to light, neck supple, no masses, no lymphadenopathy, thyroid nonpalpable.  Skin: Warm and dry, no rashes. Cardiac: Regular rate and rhythm, no murmurs rubs or gallops, no lower extremity edema.  Respiratory: Clear to auscultation bilaterally. Not using accessory muscles, speaking in full sentences. Impression and Recommendations:

## 2013-07-24 NOTE — Assessment & Plan Note (Signed)
Resolved

## 2013-08-21 ENCOUNTER — Encounter: Payer: Self-pay | Admitting: Sports Medicine

## 2013-08-21 ENCOUNTER — Ambulatory Visit (INDEPENDENT_AMBULATORY_CARE_PROVIDER_SITE_OTHER): Payer: BC Managed Care – PPO | Admitting: Sports Medicine

## 2013-08-21 VITALS — BP 128/85 | HR 103 | Wt 227.0 lb

## 2013-08-21 DIAGNOSIS — Z0000 Encounter for general adult medical examination without abnormal findings: Secondary | ICD-10-CM

## 2013-08-21 DIAGNOSIS — M5416 Radiculopathy, lumbar region: Secondary | ICD-10-CM

## 2013-08-21 DIAGNOSIS — E785 Hyperlipidemia, unspecified: Secondary | ICD-10-CM

## 2013-08-21 DIAGNOSIS — E669 Obesity, unspecified: Secondary | ICD-10-CM

## 2013-08-21 DIAGNOSIS — IMO0002 Reserved for concepts with insufficient information to code with codable children: Secondary | ICD-10-CM

## 2013-08-21 MED ORDER — PHENTERMINE HCL 37.5 MG PO CAPS: 37.5000 mg | ORAL_CAPSULE | ORAL | Status: DC

## 2013-08-21 NOTE — Assessment & Plan Note (Signed)
Excellent continued weight loss. Started 269 pounds, now weighs 227 over 5 months. Refilling phentermine.

## 2013-08-21 NOTE — Assessment & Plan Note (Signed)
Adding PSA per patient request, we did discuss the limitations of this test.

## 2013-08-21 NOTE — Assessment & Plan Note (Signed)
Does get some abnormal taste in his mouth from omega-3 fatty acids. I am going to give him a sample of Vascepa.

## 2013-08-21 NOTE — Assessment & Plan Note (Signed)
Doing well after epidural. Still gets some cramping, continue gabapentin, we will continue to try further nonpharmacologic measures per patient request. Increase magnesium oxide 1200 mg at bedtime, and try vinegar.

## 2013-08-21 NOTE — Progress Notes (Signed)
  Subjective:    CC: Followup  HPI: Obesity: Over 40 pound weight loss since starting medication.  Hyperlipidemia: Desires to try a different stomach fatty acid, he gets too much of a fishy taste from the prescription strength one.  Lumbar radiculopathy : currently doing gabapentin, still feels like he has a good response from his last epidural, is not getting much response to 800 milligrams of magnesium at bedtime.  Past medical history, Surgical history, Family history not pertinant except as noted below, Social history, Allergies, and medications have been entered into the medical record, reviewed, and no changes needed.   Review of Systems: No fevers, chills, night sweats, weight loss, chest pain, or shortness of breath.   Objective:    General: Well Developed, well nourished, and in no acute distress.  Neuro: Alert and oriented x3, extra-ocular muscles intact, sensation grossly intact.  HEENT: Normocephalic, atraumatic, pupils equal round reactive to light, neck supple, no masses, no lymphadenopathy, thyroid nonpalpable.  Skin: Warm and dry, no rashes. Cardiac: Regular rate and rhythm, no murmurs rubs or gallops, no lower extremity edema.  Respiratory: Clear to auscultation bilaterally. Not using accessory muscles, speaking in full sentences.  Impression and Recommendations:

## 2013-08-24 ENCOUNTER — Telehealth: Payer: Self-pay | Admitting: Sports Medicine

## 2013-08-24 ENCOUNTER — Telehealth: Payer: Self-pay

## 2013-08-24 ENCOUNTER — Other Ambulatory Visit: Payer: Self-pay | Admitting: Sports Medicine

## 2013-08-24 MED ORDER — GABAPENTIN 300 MG PO CAPS: ORAL_CAPSULE | ORAL | Status: DC

## 2013-08-24 MED ORDER — ICOSAPENT ETHYL 1 G PO CAPS: ORAL_CAPSULE | ORAL | Status: DC

## 2013-08-24 NOTE — Telephone Encounter (Signed)
Called the pharmacy to refill Gabapentin and the pharmacist said she needed new directions on this medication because he was already on that current dose. Rhonda Cunningham,CMA

## 2013-08-24 NOTE — Telephone Encounter (Signed)
Ok to refill? Patient request a refill of Gabapentin. Rhonda Cunningham,CMA

## 2013-08-24 NOTE — Telephone Encounter (Signed)
Did he like the samples of the fish oil?

## 2013-08-24 NOTE — Telephone Encounter (Signed)
Prescriptions called in for the new fish oil samples and gabapentin. He needs to use the discount card given for the new fish oil.

## 2013-08-24 NOTE — Telephone Encounter (Signed)
Pt called for refill on Gabapentin.

## 2013-08-24 NOTE — Telephone Encounter (Signed)
Patient called stated that he did not get a refill on Gabepentin that was perscribed and he also request a Rx for omega 3 fish oil. Lynnleigh Soden,CMA

## 2013-08-24 NOTE — Telephone Encounter (Signed)
Ok to fill 

## 2013-08-24 NOTE — Telephone Encounter (Signed)
Yes patient did like the samples of the fish oil. Jacy Brocker,CMA

## 2013-08-25 NOTE — Telephone Encounter (Signed)
Patient has been notified. Richard Gross,CMA  

## 2013-09-03 LAB — COMPREHENSIVE METABOLIC PANEL
AST: 28 U/L (ref 0–37)
Albumin: 4.6 g/dL (ref 3.5–5.2)
Alkaline Phosphatase: 42 U/L (ref 39–117)
BUN: 23 mg/dL (ref 6–23)
Calcium: 9.2 mg/dL (ref 8.4–10.5)
Chloride: 105 mEq/L (ref 96–112)
Glucose, Bld: 88 mg/dL (ref 70–99)
Potassium: 4.4 mEq/L (ref 3.5–5.3)
Sodium: 141 mEq/L (ref 135–145)
Total Bilirubin: 0.5 mg/dL (ref 0.2–1.2)
Total Protein: 6.3 g/dL (ref 6.0–8.3)

## 2013-09-03 LAB — PSA, TOTAL AND FREE
PSA, Free Pct: 18 % — ABNORMAL LOW (ref >25)
PSA, Free: 0.12 ng/mL
PSA: 0.66 ng/mL (ref <=4.00)

## 2013-09-03 LAB — LIPID PANEL
Cholesterol: 171 mg/dL (ref 0–200)
HDL: 49 mg/dL (ref >39)
LDL Cholesterol: 103 mg/dL — ABNORMAL HIGH (ref 0–99)
Total CHOL/HDL Ratio: 3.5 ratio
Triglycerides: 95 mg/dL (ref <150)
VLDL: 19 mg/dL (ref 0–40)

## 2013-09-03 LAB — CBC
HCT: 42.8 % (ref 39.0–52.0)
Hemoglobin: 14.8 g/dL (ref 13.0–17.0)
MCH: 30.5 pg (ref 26.0–34.0)
MCHC: 34.6 g/dL (ref 30.0–36.0)
MCV: 88.1 fL (ref 78.0–100.0)
Platelets: 236 K/uL (ref 150–400)
RBC: 4.86 MIL/uL (ref 4.22–5.81)
RDW: 13.5 % (ref 11.5–15.5)
WBC: 4.4 10*3/uL (ref 4.0–10.5)

## 2013-09-03 LAB — TSH: TSH: 2.891 u[IU]/mL (ref 0.350–4.500)

## 2013-09-03 LAB — COMPREHENSIVE METABOLIC PANEL WITH GFR
ALT: 28 U/L (ref 0–53)
CO2: 28 meq/L (ref 19–32)
Creat: 1.11 mg/dL (ref 0.50–1.35)

## 2013-09-03 LAB — HEMOGLOBIN A1C
Hgb A1c MFr Bld: 5.4 % (ref <5.7)
Mean Plasma Glucose: 108 mg/dL (ref <117)

## 2013-09-04 ENCOUNTER — Encounter: Payer: Self-pay | Admitting: Sports Medicine

## 2013-09-04 ENCOUNTER — Ambulatory Visit (INDEPENDENT_AMBULATORY_CARE_PROVIDER_SITE_OTHER): Payer: BC Managed Care – PPO | Admitting: Sports Medicine

## 2013-09-04 VITALS — BP 129/79 | HR 97 | Ht 68.0 in | Wt 225.0 lb

## 2013-09-04 DIAGNOSIS — IMO0002 Reserved for concepts with insufficient information to code with codable children: Secondary | ICD-10-CM

## 2013-09-04 DIAGNOSIS — G510 Bell's palsy: Secondary | ICD-10-CM

## 2013-09-04 DIAGNOSIS — E669 Obesity, unspecified: Secondary | ICD-10-CM

## 2013-09-04 DIAGNOSIS — Z1211 Encounter for screening for malignant neoplasm of colon: Secondary | ICD-10-CM

## 2013-09-04 DIAGNOSIS — E785 Hyperlipidemia, unspecified: Secondary | ICD-10-CM

## 2013-09-04 DIAGNOSIS — Z0000 Encounter for general adult medical examination without abnormal findings: Secondary | ICD-10-CM

## 2013-09-04 DIAGNOSIS — M5416 Radiculopathy, lumbar region: Secondary | ICD-10-CM

## 2013-09-04 LAB — HEMOCCULT GUIAC POC 1CARD (OFFICE): Fecal Occult Blood, POC: NEGATIVE

## 2013-09-04 MED ORDER — MAGNESIUM OXIDE 400 MG PO TABS: 1200.0000 mg | ORAL_TABLET | Freq: Every day | ORAL | Status: DC

## 2013-09-04 NOTE — Assessment & Plan Note (Signed)
Has done extremely well with his weight loss, continues to lose weight. He does have a followup visit for another weight check, we will likely discontinue phentermine at the next visit.

## 2013-09-04 NOTE — Assessment & Plan Note (Addendum)
Continues to improve 

## 2013-09-04 NOTE — Assessment & Plan Note (Signed)
Extremely well controlled on omega threes.

## 2013-09-04 NOTE — Assessment & Plan Note (Signed)
Overall doing very well, excellent response to magnesium at 1200 mg, we are going to refill this.

## 2013-09-04 NOTE — Progress Notes (Signed)
  Subjective:    CC: CPE  HPI:  Preventive measures: Complete physical will be performed today.  Hyperlipidemia: Well controlled, this is predominantly hypertriglyceridemia.  Bell's palsy : persistent but improving.  Past medical history, Surgical history, Family history not pertinant except as noted below, Social history, Allergies, and medications have been entered into the medical record, reviewed, and no changes needed.   Review of Systems: No headache, visual changes, nausea, vomiting, diarrhea, constipation, dizziness, abdominal pain, skin rash, fevers, chills, night sweats, swollen lymph nodes, weight loss, chest pain, body aches, joint swelling, muscle aches, shortness of breath, mood changes, visual or auditory hallucinations.  Objective:    General Appearance: Alert, well developed and well nourished, cooperative, no distress.  Head: Normocephalic, no obvious abnormality  Eyes: PERRL, EOM's intact, conjunctiva and corneas clear.  Nose: Nares symmetrical, septum midline, mucosa pink, clear watery discharge; no sinus tenderness  Throat: Lips, tongue, and mucosa are moist, pink, and intact; teeth intact  Neck: Supple, symmetrical, trachea midline, no adenopathy; thyroid: no enlargement, symmetric,no tenderness/mass/nodules; no carotid bruit, no JVD  Back: Symmetrical, no curvature, ROM normal, no CVA tenderness  Chest/Breast: No mass or tenderness  Lungs: Clear to auscultation bilaterally, respirations unlabored  Heart: Normal PMI, no lower extremity edema, regular rate & rhythm, S1 and S2 normal, no murmurs, rubs, or gallops. Abdomen: Soft, non-tender, bowel sounds active all four quadrants, no mass, or organomegaly  Musculoskeletal: All joints, extremities examined, non-tender and unremarkable.  Lymphatic: No adenopathy  Skin/Hair/Nails: Skin warm, dry, and intact, no rashes or abnormal dyspigmentation  Neurologic: Alert and oriented x3, no cranial nerve deficits, normal  strength and tone, gait steady  Rectal: Good tone, Hemoccult negative, prostate smooth.  Impression and Recommendations:    The patient was counselled, risk factors were discussed, anticipatory guidance given.

## 2013-09-04 NOTE — Assessment & Plan Note (Signed)
Physical performed today. Up-to-date on all screening measures including vaccinations and colonoscopy. Hemoccult negative today. All blood work was negative.

## 2013-09-13 ENCOUNTER — Other Ambulatory Visit: Payer: Self-pay | Admitting: Sports Medicine

## 2013-10-15 ENCOUNTER — Ambulatory Visit (INDEPENDENT_AMBULATORY_CARE_PROVIDER_SITE_OTHER): Payer: BC Managed Care – PPO | Admitting: Sports Medicine

## 2013-10-15 ENCOUNTER — Encounter: Payer: Self-pay | Admitting: Sports Medicine

## 2013-10-15 VITALS — BP 131/81 | HR 99 | Ht 68.0 in | Wt 220.0 lb

## 2013-10-15 DIAGNOSIS — E669 Obesity, unspecified: Secondary | ICD-10-CM

## 2013-10-15 DIAGNOSIS — I951 Orthostatic hypotension: Secondary | ICD-10-CM

## 2013-10-15 DIAGNOSIS — M5416 Radiculopathy, lumbar region: Secondary | ICD-10-CM

## 2013-10-15 DIAGNOSIS — IMO0002 Reserved for concepts with insufficient information to code with codable children: Secondary | ICD-10-CM

## 2013-10-15 DIAGNOSIS — E785 Hyperlipidemia, unspecified: Secondary | ICD-10-CM

## 2013-10-15 MED ORDER — ICOSAPENT ETHYL 1 G PO CAPS: ORAL_CAPSULE | ORAL | Status: DC

## 2013-10-15 MED ORDER — PHENTERMINE HCL 37.5 MG PO CAPS: 37.5000 mg | ORAL_CAPSULE | ORAL | Status: DC

## 2013-10-15 MED ORDER — GABAPENTIN 600 MG PO TABS: ORAL_TABLET | ORAL | Status: DC

## 2013-10-15 NOTE — Assessment & Plan Note (Signed)
Continued excellent weight loss. I'm going to add an additional 3 months of phentermine, after which point we will stop. Return to see me in 3 months.

## 2013-10-15 NOTE — Assessment & Plan Note (Signed)
Continue Vascepa and fenofibrate.

## 2013-10-15 NOTE — Assessment & Plan Note (Addendum)
Continues to be well controlled with 600 gabapentin twice a day at 1200 magnesium oxide at bedtime. He does desire to switch to 600 mg gabapentin, we will do this and he can take this 2-3 times per day

## 2013-10-15 NOTE — Assessment & Plan Note (Signed)
Advised knee-high compression hose.

## 2013-10-15 NOTE — Progress Notes (Signed)
  Subjective:    CC: Follow up  HPI: Obesity: Continued excellent weight loss with phentermine.  Lumbar radiculopathy: Symptoms have essentially resolved now with gabapentin 600 mg twice a day and magnesium oxide 1200 mg at bedtime.  Orthostasis: Since we increased his magnesium from [phone] he has noted episodes of presyncopal sensations when he gets up too quickly. No vertiginous symptoms, no chest pain, no issues with exertion.  Hypertriglyceridemia: Doing well with fenofibrate and Vascepa.  Past medical history, Surgical history, Family history not pertinant except as noted below, Social history, Allergies, and medications have been entered into the medical record, reviewed, and no changes needed.   Review of Systems: No fevers, chills, night sweats, weight loss, chest pain, or shortness of breath.   Objective:    General: Well Developed, well nourished, and in no acute distress.  Neuro: Alert and oriented x3, extra-ocular muscles intact, sensation grossly intact.  HEENT: Normocephalic, atraumatic, pupils equal round reactive to light, neck supple, no masses, no lymphadenopathy, thyroid nonpalpable.  Skin: Warm and dry, no rashes. Cardiac: Regular rate and rhythm, no murmurs rubs or gallops, no lower extremity edema.  Respiratory: Clear to auscultation bilaterally. Not using accessory muscles, speaking in full sentences.  Impression and Recommendations:

## 2013-12-23 ENCOUNTER — Other Ambulatory Visit: Payer: Self-pay | Admitting: Sports Medicine

## 2014-01-16 ENCOUNTER — Other Ambulatory Visit: Payer: Self-pay | Admitting: Sports Medicine

## 2014-01-18 ENCOUNTER — Other Ambulatory Visit: Payer: Self-pay | Admitting: Sports Medicine

## 2014-01-18 MED ORDER — MELOXICAM 15 MG PO TABS: 15.0000 mg | ORAL_TABLET | Freq: Every day | ORAL | Status: DC

## 2014-01-22 ENCOUNTER — Ambulatory Visit (INDEPENDENT_AMBULATORY_CARE_PROVIDER_SITE_OTHER): Payer: BC Managed Care – PPO | Admitting: Sports Medicine

## 2014-01-22 ENCOUNTER — Encounter: Payer: Self-pay | Admitting: Sports Medicine

## 2014-01-22 VITALS — BP 126/76 | HR 98 | Ht 69.0 in | Wt 215.0 lb

## 2014-01-22 DIAGNOSIS — E785 Hyperlipidemia, unspecified: Secondary | ICD-10-CM

## 2014-01-22 DIAGNOSIS — B351 Tinea unguium: Secondary | ICD-10-CM

## 2014-01-22 DIAGNOSIS — E669 Obesity, unspecified: Secondary | ICD-10-CM

## 2014-01-22 MED ORDER — EFINACONAZOLE 10 % EX SOLN: CUTANEOUS | Status: DC

## 2014-01-22 MED ORDER — PHENTERMINE HCL 37.5 MG PO TABS: 18.7500 mg | ORAL_TABLET | Freq: Every day | ORAL | Status: DC

## 2014-01-22 NOTE — Patient Instructions (Signed)
Start a walk-run progression: - I would like you to do line drills (try to keep foot on a line when jogging). - Initially start one minute walking than one minute running for 20 mins in the first week,   then 25 mins during the second week, then 30 mins afterwards.  Once you have reached 30 mins: - Run 2 mins, then walk 1 min. -Then run 3 mins, and walk 1 min. -Then run 4 mins, and walk 1 min. -Then run 5 mins, and walk 1 min. -Slowly build up weekly to running 30 mins nonstop.  If painful at any of the steps, back up one step.  Goal HR is 130 BPM, keep it here for 30 mins 3-5x a week.

## 2014-01-22 NOTE — Progress Notes (Addendum)
  Subjective:    CC: Followup  HPI: Obesity: Richard Gross is now off of the phentermine, he lost approximately 54 pounds in 6 months. He is amenable to going back on a low dose to help maintain his weight.  Onychomycosis: He has not yet responded to 6 months of oral terbinafine. There is persistent pain with extensive toenail involvement.  Hyperlipidemia: Stable on EPA and fenofibrate.  Past medical history, Surgical history, Family history not pertinant except as noted below, Social history, Allergies, and medications have been entered into the medical record, reviewed, and no changes needed.   Review of Systems: No fevers, chills, night sweats, weight loss, chest pain, or shortness of breath.   Objective:    General: Well Developed, well nourished, and in no acute distress.  Neuro: Alert and oriented x3, extra-ocular muscles intact, sensation grossly intact.  HEENT: Normocephalic, atraumatic, pupils equal round reactive to light, neck supple, no masses, no lymphadenopathy, thyroid nonpalpable.  Skin: Warm and dry, no rashes. Left great toenail continues to be thickened and yellow, there is extensive involvement and it is tender. Cardiac: Regular rate and rhythm, no murmurs rubs or gallops, no lower extremity edema.  Respiratory: Clear to auscultation bilaterally. Not using accessory muscles, speaking in full sentences.  Impression and Recommendations:

## 2014-01-22 NOTE — Assessment & Plan Note (Addendum)
No response to 6 months of Lamisil. Switching to [place]. There is extensive involvement of the toenail, and this is causing significant pain interfering with his ambulation. If no response after a couple of months we will remove his toenail.

## 2014-01-22 NOTE — Assessment & Plan Note (Signed)
Doing well on fenofibrate.

## 2014-01-22 NOTE — Assessment & Plan Note (Signed)
Total of 54 pound weight loss. Switching to low-dose phentermine.

## 2014-02-08 ENCOUNTER — Other Ambulatory Visit: Payer: Self-pay | Admitting: Sports Medicine

## 2014-02-08 DIAGNOSIS — B351 Tinea unguium: Secondary | ICD-10-CM

## 2014-02-08 NOTE — Assessment & Plan Note (Signed)
persistent symptoms despite aggressive and prolonged oral Lamisil. Referral to dermatology.

## 2014-03-11 ENCOUNTER — Other Ambulatory Visit: Payer: Self-pay | Admitting: Sports Medicine

## 2014-03-26 ENCOUNTER — Encounter: Payer: Self-pay | Admitting: Sports Medicine

## 2014-03-26 ENCOUNTER — Ambulatory Visit (INDEPENDENT_AMBULATORY_CARE_PROVIDER_SITE_OTHER): Payer: BC Managed Care – PPO | Admitting: Sports Medicine

## 2014-03-26 VITALS — BP 125/76 | HR 98 | Ht 69.0 in | Wt 217.0 lb

## 2014-03-26 DIAGNOSIS — E669 Obesity, unspecified: Secondary | ICD-10-CM

## 2014-03-26 DIAGNOSIS — B351 Tinea unguium: Secondary | ICD-10-CM

## 2014-03-26 MED ORDER — PHENTERMINE HCL 37.5 MG PO TABS: ORAL_TABLET | ORAL | Status: DC

## 2014-03-26 NOTE — Progress Notes (Signed)
  Subjective:    CC: Followup  HPI: Obesity: Excellent weight loss, currently doing one half phentermine tablet daily. Needs a refill.  Onychomycosis: Worse than the left great toenail, he has already been for greater than 6 months now of Lamisil, and we were unable to get Jublia approved. His nail is hypertrophic and dystrophic, he is amenable to scheduling for nail plate fixation.  Past medical history, Surgical history, Family history not pertinant except as noted below, Social history, Allergies, and medications have been entered into the medical record, reviewed, and no changes needed.   Review of Systems: No fevers, chills, night sweats, weight loss, chest pain, or shortness of breath.   Objective:    General: Well Developed, well nourished, and in no acute distress.  Neuro: Alert and oriented x3, extra-ocular muscles intact, sensation grossly intact.  HEENT: Normocephalic, atraumatic, pupils equal round reactive to light, neck supple, no masses, no lymphadenopathy, thyroid nonpalpable.  Skin: Warm and dry, no rashes. Left great toenail is dystrophic, thickened, yellow. Cardiac: Regular rate and rhythm, no murmurs rubs or gallops, no lower extremity edema.  Respiratory: Clear to auscultation bilaterally. Not using accessory muscles, speaking in full sentences.   Impression and Recommendations:

## 2014-03-26 NOTE — Assessment & Plan Note (Signed)
Continues to do well with over 50 pound weight loss.  Refilling phentermine half dose.

## 2014-03-26 NOTE — Assessment & Plan Note (Signed)
Return for left great toenail removal with phenol treatment.

## 2014-04-01 ENCOUNTER — Encounter: Payer: Self-pay | Admitting: Sports Medicine

## 2014-04-01 ENCOUNTER — Ambulatory Visit (INDEPENDENT_AMBULATORY_CARE_PROVIDER_SITE_OTHER): Payer: BC Managed Care – PPO | Admitting: Sports Medicine

## 2014-04-01 VITALS — BP 117/75 | HR 95 | Wt 218.0 lb

## 2014-04-01 DIAGNOSIS — B351 Tinea unguium: Secondary | ICD-10-CM

## 2014-04-01 MED ORDER — HYDROCODONE-ACETAMINOPHEN 5-325 MG PO TABS: 1.0000 | ORAL_TABLET | Freq: Three times a day (TID) | ORAL | Status: DC | PRN

## 2014-04-01 NOTE — Progress Notes (Signed)
  Procedure: Removal of Left great toenail. Due to extensive anxiety regarding the procedure a timeout was conducted, and 2 mg of lorazepam was given slow IV push into the dorsal venous plexus of the right hand through a 31-gauge needle by physician. Risks, benefits, alternatives explained to patient. Consent obtained. Time out conducted. Noted no overlying induration or erythema at site of injection. Toe cleaned with alcohol, then a total of 10 cc lidocaine 2% infiltrated at adjacent webspaces at the location of the bifurcation of the common digital nerve to proper digital nerves. So medications also placed over the dorsum of the MTP.  Some lidocaine also infiltrated at hyponychium and under nail bed.  Adequate anesthesia ensured. Toe prepped and draped in a sterile fashion. Nail elevator used to separate nail plate from nail bed. Hemostat then used to separate nail fragment from surrounding structures. Nail bed and matrix treated with phenol. Minor bleeding controlled with pressure and silver nitrate. Antibiotic ointment applied. Toe dressed. Advised to return if increased redness, swelling, drainage, fevers, or chills. Patient discharged to the care of his wife who will drive him home.

## 2014-04-01 NOTE — Assessment & Plan Note (Signed)
Left great toenail removal as above. Return in one week for a wound check. Hydrocodone for postoperative pain.

## 2014-04-01 NOTE — Patient Instructions (Signed)
Toenail Removal Toenails may need to be removed because of injury, infections, or to correct abnormal growth. A special non-stick bandage will likely be put tightly on your toe to prevent bleeding. Often times a new nail will grow back. Sometimes the new nail may be deformed. Most of the time when a nail is lost, it will gradually heal, but may be sensitive for a long time. HOME CARE INSTRUCTIONS   Keep your foot elevated to relieve pain and swelling. This will require lying in bed or on a couch with the leg on pillows or sitting in a recliner with the leg up. Walking or letting your leg dangle may increase swelling, slow healing, and cause throbbing pain.  Keep your bandage dry and clean.  Change your bandage in 24 hours.  After your bandage is changed, soak your foot in warm, soapy water for 10 to 20 minutes. Do this 3 times per day. This helps reduce pain and swelling. After soaking your foot apply a clean, dry bandage. Change your bandage if it is wet or dirty.  Only take over-the-counter or prescription medicines for pain, discomfort, or fever as directed by your caregiver.  See your caregiver as needed for problems. You might need a tetanus shot now if:  You have no idea when you had the last one.  You have never had a tetanus shot before.  The injured area had dirt in it. If you need a tetanus shot, and you decide not to get one, there is a rare chance of getting tetanus. Sickness from tetanus can be serious. If you did get a tetanus shot, your arm may swell, get red and warm to the touch at the shot site. This is common and not a problem. SEEK IMMEDIATE MEDICAL CARE IF:   You have increased pain, swelling, redness, warmth, drainage, or bleeding.  You have a fever.  You have swelling that spreads from your toe into your foot. Document Released: 03/31/2003 Document Revised: 09/24/2011 Document Reviewed: 07/12/2008 ExitCare Patient Information 2015 [employer]. This  information is not intended to replace advice given to you by your health care provider. Make sure you discuss any questions you have with your health care provider. 

## 2014-04-08 ENCOUNTER — Ambulatory Visit: Payer: BC Managed Care – PPO | Admitting: Sports Medicine

## 2014-04-13 ENCOUNTER — Encounter: Payer: Self-pay | Admitting: Sports Medicine

## 2014-04-13 ENCOUNTER — Ambulatory Visit (INDEPENDENT_AMBULATORY_CARE_PROVIDER_SITE_OTHER): Payer: BC Managed Care – PPO | Admitting: Sports Medicine

## 2014-04-13 VITALS — BP 118/74 | HR 102 | Ht 69.0 in | Wt 219.0 lb

## 2014-04-13 DIAGNOSIS — B351 Tinea unguium: Secondary | ICD-10-CM

## 2014-04-13 DIAGNOSIS — E669 Obesity, unspecified: Secondary | ICD-10-CM

## 2014-04-13 DIAGNOSIS — E785 Hyperlipidemia, unspecified: Secondary | ICD-10-CM

## 2014-04-13 DIAGNOSIS — E781 Pure hyperglyceridemia: Secondary | ICD-10-CM

## 2014-04-13 LAB — BASIC METABOLIC PANEL WITH GFR
Chloride: 105 meq/L (ref 96–112)
Glucose, Bld: 88 mg/dL (ref 70–99)
Potassium: 5 meq/L (ref 3.5–5.3)
Sodium: 141 meq/L (ref 135–145)

## 2014-04-13 LAB — BASIC METABOLIC PANEL
BUN: 25 mg/dL — ABNORMAL HIGH (ref 6–23)
CO2: 29 mEq/L (ref 19–32)
Calcium: 9.8 mg/dL (ref 8.4–10.5)
Creat: 1.23 mg/dL (ref 0.50–1.35)

## 2014-04-13 LAB — MAGNESIUM: Magnesium: 2 mg/dL (ref 1.5–2.5)

## 2014-04-13 LAB — PHOSPHORUS: Phosphorus: 3.7 mg/dL (ref 2.3–4.6)

## 2014-04-13 NOTE — Assessment & Plan Note (Signed)
Doing well after toenail excision.

## 2014-04-13 NOTE — Progress Notes (Signed)
  Subjective:    CC: Followup  HPI: Hypertriglyceridemia: Getting some cramping with TriCor, overall slightly better with magnesium however getting some loose stools. Wonders what the next step will be.  Onychomycosis: Post toenail excision with phenol treatment, doing well, essentially pain-free.  Obesity: Would like to consider Contrave or Belviq.  Past medical history, Surgical history, Family history not pertinant except as noted below, Social history, Allergies, and medications have been entered into the medical record, reviewed, and no changes needed.   Review of Systems: No fevers, chills, night sweats, weight loss, chest pain, or shortness of breath.   Objective:    General: Well Developed, well nourished, and in no acute distress.  Neuro: Alert and oriented x3, extra-ocular muscles intact, sensation grossly intact.  HEENT: Normocephalic, atraumatic, pupils equal round reactive to light, neck supple, no masses, no lymphadenopathy, thyroid nonpalpable.  Skin: Warm and dry, no rashes. Site of great toenail excision is clean, dry, intact and healing well. Cardiac: Regular rate and rhythm, no murmurs rubs or gallops, no lower extremity edema.  Respiratory: Clear to auscultation bilaterally. Not using accessory muscles, speaking in full sentences.  Impression and Recommendations:

## 2014-04-13 NOTE — Assessment & Plan Note (Signed)
Some cramping, continue fenofibrate and EPA, checking electrolytes.

## 2014-04-13 NOTE — Assessment & Plan Note (Signed)
Desires to look into Contrave and Belviq.

## 2014-04-21 ENCOUNTER — Ambulatory Visit: Payer: BC Managed Care – PPO

## 2014-05-18 ENCOUNTER — Other Ambulatory Visit: Payer: Self-pay | Admitting: Sports Medicine

## 2014-05-19 ENCOUNTER — Other Ambulatory Visit: Payer: Self-pay | Admitting: Sports Medicine

## 2014-05-21 ENCOUNTER — Ambulatory Visit (INDEPENDENT_AMBULATORY_CARE_PROVIDER_SITE_OTHER): Payer: BC Managed Care – PPO

## 2014-05-21 ENCOUNTER — Encounter: Payer: Self-pay | Admitting: Sports Medicine

## 2014-05-21 ENCOUNTER — Ambulatory Visit (INDEPENDENT_AMBULATORY_CARE_PROVIDER_SITE_OTHER): Payer: BC Managed Care – PPO | Admitting: Sports Medicine

## 2014-05-21 DIAGNOSIS — E669 Obesity, unspecified: Secondary | ICD-10-CM

## 2014-05-21 DIAGNOSIS — M5416 Radiculopathy, lumbar region: Secondary | ICD-10-CM

## 2014-05-21 DIAGNOSIS — Z23 Encounter for immunization: Secondary | ICD-10-CM | POA: Diagnosis not present

## 2014-05-21 MED ORDER — PHENTERMINE HCL 37.5 MG PO TABS: 18.7500 mg | ORAL_TABLET | Freq: Every day | ORAL | Status: DC

## 2014-05-21 MED ORDER — NALTREXONE-BUPROPION HCL ER 8-90 MG PO TB12
2.0000 | ORAL_TABLET | Freq: Two times a day (BID) | ORAL | Status: DC
Start: 2014-05-21 — End: 2014-09-06

## 2014-05-21 MED ORDER — NALTREXONE-BUPROPION HCL ER 8-90 MG PO TB12: ORAL_TABLET | ORAL | Status: DC

## 2014-05-21 NOTE — Assessment & Plan Note (Signed)
Continue phentermine one half tab. Adding Contrave.

## 2014-05-21 NOTE — Progress Notes (Signed)
  Subjective:    CC: obesity  HPI: Sharief returns, he did extremely well on phentermine, he cut him back to one half tab daily and he put on a few pounds. We did discuss Belviq and Contrave, he is amenable to starting on of these medications.  Orthostasis: Currently taking 2 magnesium pills twice a day, was doing extremely well, cut down to one pill in the morning and 2 in the evening, unfortunately having some orthostatic symptoms when standing. When discontinuing magnesium he did develop worsening cramping.  Lumbar radiculopathy: Moderate, persistent, localized in the right leg in an L5 distribution, previous right-sided L5-S1 interlaminar epidural provided nearly one year of relief.  Past medical history, Surgical history, Family history not pertinant except as noted below, Social history, Allergies, and medications have been entered into the medical record, reviewed, and no changes needed.   Review of Systems: No fevers, chills, night sweats, weight loss, chest pain, or shortness of breath.   Objective:    General: Well Developed, well nourished, and in no acute distress.  Neuro: Alert and oriented x3, extra-ocular muscles intact, sensation grossly intact.  HEENT: Normocephalic, atraumatic, pupils equal round reactive to light, neck supple, no masses, no lymphadenopathy, thyroid nonpalpable.  Skin: Warm and dry, no rashes. Cardiac: Regular rate and rhythm, no murmurs rubs or gallops, no lower extremity edema.  Respiratory: Clear to auscultation bilaterally. Not using accessory muscles, speaking in full sentences.  Impression and Recommendations:

## 2014-05-26 ENCOUNTER — Telehealth: Payer: Self-pay | Admitting: *Deleted

## 2014-05-26 NOTE — Telephone Encounter (Signed)
Contrave approved patient and pharmacy notified. Margette Fast, RMA

## 2014-06-15 ENCOUNTER — Encounter: Payer: Self-pay | Admitting: Physician Assistant

## 2014-06-15 ENCOUNTER — Ambulatory Visit (INDEPENDENT_AMBULATORY_CARE_PROVIDER_SITE_OTHER): Payer: BC Managed Care – PPO | Admitting: Physician Assistant

## 2014-06-15 VITALS — BP 111/67 | HR 88 | Temp 97.8°F | Ht 69.0 in | Wt 227.0 lb

## 2014-06-15 DIAGNOSIS — J209 Acute bronchitis, unspecified: Secondary | ICD-10-CM

## 2014-06-15 DIAGNOSIS — J0100 Acute maxillary sinusitis, unspecified: Secondary | ICD-10-CM

## 2014-06-15 DIAGNOSIS — E669 Obesity, unspecified: Secondary | ICD-10-CM

## 2014-06-15 MED ORDER — AZITHROMYCIN 250 MG PO TABS: ORAL_TABLET | ORAL | Status: DC

## 2014-06-15 NOTE — Progress Notes (Signed)
   Subjective:    Patient ID: Richard Gross, male    DOB: 11/26/1960, 53 y.o.   MRN: [id number]  HPI Pt presents to the clinic with 5 days of sinus pressure, cough, chest tightness, ST, ear pressure. No fever or chills. No n/v/d. Tried nyquil only. Cough is productive with greenish sputum. His wife is also sick with same symptoms and treated for bronchitis. He does not smoke.   He also would like to discuss weight loss drug given contrave.    Review of Systems  All other systems reviewed and are negative.      Objective:   Physical Exam  Constitutional: He is oriented to person, place, and time. He appears well-developed and well-nourished.  HENT:  Head: Normocephalic and atraumatic.  Right Ear: External ear normal.  Left Ear: External ear normal.  Bilateral TM erythematous and injected.   Maxillary sinus tenderness to palpation.   Oropharynx erythematous with no tonsilar swelling or exudate.   Bilateral nasal turbinates are red and swollen.   Eyes: Conjunctivae are normal. Right eye exhibits no discharge. Left eye exhibits no discharge.  Neck: Normal range of motion. Neck supple.  Cardiovascular: Normal rate, regular rhythm and normal heart sounds.   Pulmonary/Chest:  No wheezing.  Scattered rhonchi with coughing and deep breathing.  Pulse ox 98.   Lymphadenopathy:    He has no cervical adenopathy.  Neurological: He is alert and oriented to person, place, and time.  Skin: Skin is dry.  Psychiatric: He has a normal mood and affect. His behavior is normal.          Assessment & Plan:  Acute bronchitis/acute sinusitis- treated with zpak. Discussed symptomatic care. Encouraged flonase 2 sprays each nostril. Consider mucinex twice a day and sinus rinses.   Obesity- pt was given contrave by Dr. Darene Lamer for weight loss. Discussed side effects more in detail. Discussed safety of drug. Created reassurance that a trial is warranted.  Pt will further consider trying.

## 2014-07-03 ENCOUNTER — Other Ambulatory Visit: Payer: Self-pay | Admitting: Sports Medicine

## 2014-07-13 ENCOUNTER — Ambulatory Visit
Admission: RE | Admit: 2014-07-13 | Discharge: 2014-07-13 | Disposition: A | Discharge status: Home / Self Care | Payer: BC Managed Care – PPO | Source: Ambulatory Visit | Attending: Sports Medicine | Admitting: Sports Medicine

## 2014-07-13 DIAGNOSIS — M5416 Radiculopathy, lumbar region: Secondary | ICD-10-CM

## 2014-07-13 MED ORDER — METHYLPREDNISOLONE ACETATE 40 MG/ML INJ SUSP (RADIOLOG
120.0000 mg | Freq: Once | INTRAMUSCULAR | Status: AC
  Administered 2014-07-13: 120 mg via EPIDURAL

## 2014-07-13 MED ORDER — IOHEXOL 180 MG/ML  SOLN
1.0000 mL | Freq: Once | INTRAMUSCULAR | Status: AC | PRN
  Administered 2014-07-13: 1 mL via EPIDURAL

## 2014-07-13 NOTE — Discharge Instructions (Signed)

## 2014-08-20 ENCOUNTER — Ambulatory Visit: Payer: BC Managed Care – PPO | Admitting: Sports Medicine

## 2014-08-20 DIAGNOSIS — Z0289 Encounter for other administrative examinations: Secondary | ICD-10-CM

## 2014-09-06 ENCOUNTER — Encounter: Payer: Self-pay | Admitting: Sports Medicine

## 2014-09-06 ENCOUNTER — Ambulatory Visit (INDEPENDENT_AMBULATORY_CARE_PROVIDER_SITE_OTHER): Payer: BLUE CROSS/BLUE SHIELD | Admitting: Sports Medicine

## 2014-09-06 VITALS — BP 127/83 | HR 92 | Ht 69.0 in | Wt 237.0 lb

## 2014-09-06 DIAGNOSIS — M5416 Radiculopathy, lumbar region: Secondary | ICD-10-CM

## 2014-09-06 DIAGNOSIS — B356 Tinea cruris: Secondary | ICD-10-CM | POA: Insufficient documentation

## 2014-09-06 DIAGNOSIS — E669 Obesity, unspecified: Secondary | ICD-10-CM | POA: Diagnosis not present

## 2014-09-06 MED ORDER — CLOTRIMAZOLE-BETAMETHASONE 1-0.05 % EX CREA
1.0000 | TOPICAL_CREAM | Freq: Two times a day (BID) | CUTANEOUS | Status: DC
Start: 2014-09-06 — End: 2016-03-16

## 2014-09-06 MED ORDER — LORCASERIN HCL 10 MG PO TABS: 1.0000 | ORAL_TABLET | Freq: Two times a day (BID) | ORAL | Status: DC

## 2014-09-06 MED ORDER — ALUMINUM CHLORIDE 20 % EX SOLN: CUTANEOUS | Status: DC

## 2014-09-06 NOTE — Assessment & Plan Note (Signed)
Continues to do well after his second epidural

## 2014-09-06 NOTE — Assessment & Plan Note (Signed)
Lotrisone, Drysol.

## 2014-09-06 NOTE — Assessment & Plan Note (Signed)
Switching to Belviq. Good weight loss initially on phentermine, we have finished over 6 months, and intolerance of Contrave.

## 2014-09-06 NOTE — Progress Notes (Signed)
  Subjective:    CC: Follow-up  HPI: Rash: Groin, pruritic, history of tinea cruris in the past. Needs a cream. Also with significant sweating in the area.  Lumbar radiculopathy: Moderately good response to his most recent epidural, the first epidural provided one year response, he is happy with how the current was going.  Obesity: Intolerance of Contrave, and he has finished the phentermine. Amenable to try Belviq.  Past medical history, Surgical history, Family history not pertinant except as noted below, Social history, Allergies, and medications have been entered into the medical record, reviewed, and no changes needed.   Review of Systems: No fevers, chills, night sweats, weight loss, chest pain, or shortness of breath.   Objective:    General: Well Developed, well nourished, and in no acute distress.  Neuro: Alert and oriented x3, extra-ocular muscles intact, sensation grossly intact.  HEENT: Normocephalic, atraumatic, pupils equal round reactive to light, neck supple, no masses, no lymphadenopathy, thyroid nonpalpable.  Skin: Warm and dry, no rashes. Cardiac: Regular rate and rhythm, no murmurs rubs or gallops, no lower extremity edema.  Respiratory: Clear to auscultation bilaterally. Not using accessory muscles, speaking in full sentences.  Impression and Recommendations:

## 2014-09-09 ENCOUNTER — Telehealth: Payer: Self-pay

## 2014-09-09 NOTE — Telephone Encounter (Signed)
Sent PA for Belviq through cover my meds waiting on auth. - CF

## 2014-09-13 NOTE — Telephone Encounter (Signed)
Received Fax for Belviq it is approved. - CF

## 2014-10-15 ENCOUNTER — Other Ambulatory Visit: Payer: Self-pay | Admitting: Sports Medicine

## 2014-10-15 ENCOUNTER — Telehealth: Payer: Self-pay | Admitting: Sports Medicine

## 2014-10-15 MED ORDER — ICOSAPENT ETHYL 1 G PO CAPS: 2.0000 | ORAL_CAPSULE | Freq: Two times a day (BID) | ORAL | Status: DC

## 2014-10-18 NOTE — Telephone Encounter (Signed)
Rx was sent to [facility]  on 10/15/2014. Donye Campanelli,CMA

## 2014-10-25 ENCOUNTER — Ambulatory Visit: Payer: BLUE CROSS/BLUE SHIELD | Admitting: Sports Medicine

## 2014-10-27 ENCOUNTER — Other Ambulatory Visit: Payer: Self-pay | Admitting: Sports Medicine

## 2014-10-27 DIAGNOSIS — M5416 Radiculopathy, lumbar region: Secondary | ICD-10-CM

## 2014-10-27 MED ORDER — GABAPENTIN 600 MG PO TABS: ORAL_TABLET | ORAL | Status: DC

## 2014-12-03 ENCOUNTER — Encounter: Payer: Self-pay | Admitting: Sports Medicine

## 2014-12-03 ENCOUNTER — Ambulatory Visit (INDEPENDENT_AMBULATORY_CARE_PROVIDER_SITE_OTHER): Payer: BLUE CROSS/BLUE SHIELD | Admitting: Sports Medicine

## 2014-12-03 VITALS — BP 120/81 | HR 81 | Wt 246.0 lb

## 2014-12-03 DIAGNOSIS — M5416 Radiculopathy, lumbar region: Secondary | ICD-10-CM

## 2014-12-03 DIAGNOSIS — E669 Obesity, unspecified: Secondary | ICD-10-CM | POA: Diagnosis not present

## 2014-12-03 MED ORDER — LIRAGLUTIDE -WEIGHT MANAGEMENT 18 MG/3ML ~~LOC~~ SOPN: 3.0000 mg | PEN_INJECTOR | Freq: Every day | SUBCUTANEOUS | Status: DC

## 2014-12-03 NOTE — Progress Notes (Signed)
  Subjective:    CC: Follow-up  HPI: Obesity: Excellent response to phentermine, with a proximally 60 pound weight loss, he has gained some of it back, we tried Contrave which she was unable to tolerate as well as Belviq which provided no weight loss. He is amenable to switch to an injectable at this point.  Lumbar degenerative disc disease: Known, and doing well post-epidurals, at work several months ago he had an injury, this was reported he was set up with the company doctor, an MRI did show multilevel degenerative disc disease, he has since improved, but simply wants me to review the MRI. He did not bring the disc today. At the time of the injury pain was predominantly discogenic with radiation to the posterior thighs but and no clear dermatomal distribution.  Past medical history, Surgical history, Family history not pertinant except as noted below, Social history, Allergies, and medications have been entered into the medical record, reviewed, and no changes needed.   Review of Systems: No fevers, chills, night sweats, weight loss, chest pain, or shortness of breath.   Objective:    General: Well Developed, well nourished, and in no acute distress.  Neuro: Alert and oriented x3, extra-ocular muscles intact, sensation grossly intact.  HEENT: Normocephalic, atraumatic, pupils equal round reactive to light, neck supple, no masses, no lymphadenopathy, thyroid nonpalpable.  Skin: Warm and dry, no rashes. Cardiac: Regular rate and rhythm, no murmurs rubs or gallops, no lower extremity edema.  Respiratory: Clear to auscultation bilaterally. Not using accessory muscles, speaking in full sentences.  Impression and Recommendations:

## 2014-12-03 NOTE — Assessment & Plan Note (Addendum)
Recent work-related injury, discogenic-type pain that has since resolved. He is going to drop off his recent MRI. He had bilateral posterior thigh radiation, but no classic dermatomal distribution.

## 2014-12-03 NOTE — Assessment & Plan Note (Signed)
Good response to phentermine. Belviq and Contrave did not work. Starting Saxenda.

## 2014-12-15 ENCOUNTER — Telehealth: Payer: Self-pay | Admitting: Sports Medicine

## 2014-12-15 NOTE — Telephone Encounter (Signed)
Received fax for prior authorization on Saxenda sent through cover my meds waiting on authorization. - CF °

## 2014-12-16 NOTE — Telephone Encounter (Signed)
Received fax from [facility] and they approved Saxenda from 12/15/2014 - 04/19/2015. Reference #[id number] -CF

## 2015-01-03 ENCOUNTER — Encounter: Payer: Self-pay | Admitting: Sports Medicine

## 2015-01-03 ENCOUNTER — Ambulatory Visit (INDEPENDENT_AMBULATORY_CARE_PROVIDER_SITE_OTHER): Payer: BLUE CROSS/BLUE SHIELD | Admitting: Sports Medicine

## 2015-01-03 VITALS — BP 120/78 | HR 89 | Wt 245.0 lb

## 2015-01-03 DIAGNOSIS — B351 Tinea unguium: Secondary | ICD-10-CM | POA: Diagnosis not present

## 2015-01-03 DIAGNOSIS — E669 Obesity, unspecified: Secondary | ICD-10-CM

## 2015-01-03 NOTE — Assessment & Plan Note (Signed)
Overall doing well post left great toenail excision, we did use phenol however he is starting to get a bit of new toenail growth on the lateral and medial folds. We probably need to do a repeat excision, with additional phenol use.

## 2015-01-03 NOTE — Assessment & Plan Note (Signed)
60 pound weight loss on phentermine, we recently started Saxenda, he has lost a pound but is only had the injections for 1-1/2-2 weeks. Currently at 1.8 mg, working on the up titration to 3 mg. I can see him every 3 months for this.

## 2015-01-03 NOTE — Progress Notes (Signed)
  Subjective:    CC:  Follow-up  HPI: Obesity: 60 pound weight loss on phentermine, he has recently started [place] 2 weeks ago, is only on 1.8 mg, he has noted a great decrease in his appetite.  Ingrown toenail: Post excision about 9-10 months ago, he is having some regrowth of the lateral edges as well as the medial edges of the nail plate. Amenable to do a reexcision a future date.  Past medical history, Surgical history, Family history not pertinant except as noted below, Social history, Allergies, and medications have been entered into the medical record, reviewed, and no changes needed.   Review of Systems: No fevers, chills, night sweats, weight loss, chest pain, or shortness of breath.   Objective:    General: Well Developed, well nourished, and in no acute distress.  Neuro: Alert and oriented x3, extra-ocular muscles intact, sensation grossly intact.  HEENT: Normocephalic, atraumatic, pupils equal round reactive to light, neck supple, no masses, no lymphadenopathy, thyroid nonpalpable.  Skin: Warm and dry, no rashes. Cardiac: Regular rate and rhythm, no murmurs rubs or gallops, no lower extremity edema.  Respiratory: Clear to auscultation bilaterally. Not using accessory muscles, speaking in full sentences. Left great toe: There is some nail plate growth at the lateral and medial nail fold.  Impression and Recommendations:

## 2015-01-25 ENCOUNTER — Other Ambulatory Visit: Payer: Self-pay | Admitting: Sports Medicine

## 2015-01-26 ENCOUNTER — Other Ambulatory Visit: Payer: Self-pay

## 2015-01-26 DIAGNOSIS — E669 Obesity, unspecified: Secondary | ICD-10-CM

## 2015-01-26 MED ORDER — LIRAGLUTIDE -WEIGHT MANAGEMENT 18 MG/3ML ~~LOC~~ SOPN: 3.0000 mg | PEN_INJECTOR | Freq: Every day | SUBCUTANEOUS | Status: DC

## 2015-01-26 NOTE — Telephone Encounter (Signed)
Patient request refill for Saxenda. Rx has been faxed to [facility] [place]

## 2015-02-17 ENCOUNTER — Other Ambulatory Visit: Payer: Self-pay | Admitting: Sports Medicine

## 2015-02-27 ENCOUNTER — Other Ambulatory Visit: Payer: Self-pay | Admitting: Sports Medicine

## 2015-02-27 DIAGNOSIS — E669 Obesity, unspecified: Secondary | ICD-10-CM

## 2015-03-03 MED ORDER — LIRAGLUTIDE -WEIGHT MANAGEMENT 18 MG/3ML ~~LOC~~ SOPN: 3.0000 mg | PEN_INJECTOR | Freq: Every day | SUBCUTANEOUS | Status: DC

## 2015-03-03 NOTE — Addendum Note (Signed)
Addended by: Silverio Decamp on: 03/03/2015 09:11 AM   Modules accepted: Orders

## 2015-03-26 ENCOUNTER — Other Ambulatory Visit: Payer: Self-pay | Admitting: Sports Medicine

## 2015-04-08 ENCOUNTER — Ambulatory Visit: Payer: BLUE CROSS/BLUE SHIELD | Admitting: Sports Medicine

## 2015-07-28 ENCOUNTER — Ambulatory Visit (INDEPENDENT_AMBULATORY_CARE_PROVIDER_SITE_OTHER): Payer: BLUE CROSS/BLUE SHIELD | Admitting: Sports Medicine

## 2015-07-28 DIAGNOSIS — Z23 Encounter for immunization: Secondary | ICD-10-CM

## 2015-07-28 DIAGNOSIS — M5416 Radiculopathy, lumbar region: Secondary | ICD-10-CM | POA: Diagnosis not present

## 2015-07-28 MED ORDER — TRAMADOL HCL 50 MG PO TABS
ORAL_TABLET | ORAL | Status: DC
Start: 2015-07-28 — End: 2015-12-26

## 2015-07-28 NOTE — Progress Notes (Signed)
  Subjective:    CC: Low back pain  HPI: Richard Gross returns, his last epidural was a year ago, on the right, and an L5-S1 interlaminar. He had a good response however is having a recurrence of pain that he notes somewhat higher up with radiation around the right and left groin. Worse with sitting, flexion, Valsalva, no bowel or bladder dysfunction or saddle numbness. He does desire repeat epidural, tramadol is moderately effective and he needs a refill.  Preventative measures: Flu shot today, patient will go ahead and set up his colonoscopy.  Past medical history, Surgical history, Family history not pertinant except as noted below, Social history, Allergies, and medications have been entered into the medical record, reviewed, and no changes needed.   Review of Systems: No fevers, chills, night sweats, weight loss, chest pain, or shortness of breath.   Objective:    General: Well Developed, well nourished, and in no acute distress.  Neuro: Alert and oriented x3, extra-ocular muscles intact, sensation grossly intact.  HEENT: Normocephalic, atraumatic, pupils equal round reactive to light, neck supple, no masses, no lymphadenopathy, thyroid nonpalpable.  Skin: Warm and dry, no rashes. Cardiac: Regular rate and rhythm, no murmurs rubs or gallops, no lower extremity edema.  Respiratory: Clear to auscultation bilaterally. Not using accessory muscles, speaking in full sentences.  MRI personally reviewed, there is multilevel disc protrusions worst at the L3-4, L4-L5, and L5-S1 levels, there is central canal stenosis at all levels but worse at the L4-L5 level secondary to this protrusion, ligamentum flavum hypertrophy, and facet hypertrophy.  Impression and Recommendations:   I spent 25 minutes with this patient, greater than 50% was face-to-face time counseling regarding the above diagnoses

## 2015-07-28 NOTE — Assessment & Plan Note (Addendum)
Has a new MRI from [place], there is multilevel degenerative disc disease with multifactorial central canal stenosis worst at the L4-L5 level. We are going to set him up with a bilateral L4-L5 transforaminal epidural. Refilling tramadol.

## 2015-07-29 ENCOUNTER — Ambulatory Visit: Payer: Self-pay | Admitting: Sports Medicine

## 2015-08-18 ENCOUNTER — Other Ambulatory Visit: Payer: Self-pay | Admitting: Sports Medicine

## 2015-08-25 ENCOUNTER — Other Ambulatory Visit: Payer: Self-pay | Admitting: Internal Medicine

## 2015-08-30 ENCOUNTER — Other Ambulatory Visit: Payer: Self-pay | Admitting: Sports Medicine

## 2015-08-30 ENCOUNTER — Ambulatory Visit
Admission: RE | Admit: 2015-08-30 | Discharge: 2015-08-30 | Disposition: A | Discharge status: Home / Self Care | Payer: BLUE CROSS/BLUE SHIELD | Source: Ambulatory Visit | Attending: Sports Medicine | Admitting: Sports Medicine

## 2015-08-30 VITALS — BP 138/81 | HR 69

## 2015-08-30 DIAGNOSIS — M5416 Radiculopathy, lumbar region: Secondary | ICD-10-CM

## 2015-08-30 MED ORDER — METHYLPREDNISOLONE ACETATE 40 MG/ML INJ SUSP (RADIOLOG
120.0000 mg | Freq: Once | INTRAMUSCULAR | Status: AC
Start: 2015-08-30 — End: 2015-08-30
  Administered 2015-08-30: 120 mg via EPIDURAL

## 2015-08-30 MED ORDER — IOHEXOL 180 MG/ML  SOLN
1.0000 mL | Freq: Once | INTRAMUSCULAR | Status: AC | PRN
  Administered 2015-08-30: 1 mL via EPIDURAL

## 2015-08-30 MED ORDER — IOHEXOL 180 MG/ML  SOLN: 1.0000 mL | Freq: Once | INTRAMUSCULAR | Status: DC | PRN

## 2015-08-30 MED ORDER — METHYLPREDNISOLONE ACETATE 40 MG/ML INJ SUSP (RADIOLOG: 120.0000 mg | Freq: Once | INTRAMUSCULAR | Status: DC

## 2015-08-30 NOTE — Discharge Instructions (Signed)

## 2015-09-05 ENCOUNTER — Other Ambulatory Visit: Payer: Self-pay

## 2015-09-28 ENCOUNTER — Other Ambulatory Visit: Payer: Self-pay

## 2015-09-28 DIAGNOSIS — M5416 Radiculopathy, lumbar region: Secondary | ICD-10-CM

## 2015-09-28 MED ORDER — ICOSAPENT ETHYL 1 G PO CAPS: 2.0000 | ORAL_CAPSULE | Freq: Two times a day (BID) | ORAL | Status: DC

## 2015-09-28 MED ORDER — MAGNESIUM OXIDE 400 (241.3 MG) MG PO TABS: ORAL_TABLET | ORAL | Status: DC

## 2015-09-28 MED ORDER — FENOFIBRATE 160 MG PO TABS: ORAL_TABLET | ORAL | Status: DC

## 2015-09-28 MED ORDER — MELOXICAM 15 MG PO TABS: ORAL_TABLET | ORAL | Status: DC

## 2015-09-28 MED ORDER — GABAPENTIN 600 MG PO TABS: ORAL_TABLET | ORAL | Status: DC

## 2015-11-30 DIAGNOSIS — J0100 Acute maxillary sinusitis, unspecified: Secondary | ICD-10-CM | POA: Diagnosis not present

## 2015-11-30 DIAGNOSIS — H66002 Acute suppurative otitis media without spontaneous rupture of ear drum, left ear: Secondary | ICD-10-CM | POA: Diagnosis not present

## 2015-11-30 DIAGNOSIS — R05 Cough: Secondary | ICD-10-CM | POA: Diagnosis not present

## 2015-12-26 ENCOUNTER — Encounter: Payer: Self-pay | Admitting: Physician Assistant

## 2015-12-26 ENCOUNTER — Ambulatory Visit (INDEPENDENT_AMBULATORY_CARE_PROVIDER_SITE_OTHER): Payer: BLUE CROSS/BLUE SHIELD | Admitting: Physician Assistant

## 2015-12-26 VITALS — BP 129/69 | HR 83 | Ht 69.0 in | Wt 255.0 lb

## 2015-12-26 DIAGNOSIS — R058 Other specified cough: Secondary | ICD-10-CM

## 2015-12-26 DIAGNOSIS — R05 Cough: Secondary | ICD-10-CM | POA: Diagnosis not present

## 2015-12-26 MED ORDER — HYDROCOD POLST-CPM POLST ER 10-8 MG/5ML PO SUER: 5.0000 mL | Freq: Every evening | ORAL | Status: DC | PRN

## 2015-12-26 MED ORDER — PREDNISONE 20 MG PO TABS: ORAL_TABLET | ORAL | Status: DC

## 2015-12-26 NOTE — Progress Notes (Signed)
   Subjective:    Patient ID: Richard Gross, male    DOB: [birth year], 56 y.o.   MRN: [id number]  HPI  Patient is a 55 year old male who presents to the clinic with ongoing dry cough. On 11/30/15 patient was [facility] by [facility]. He was diagnosed with an upper respiratory infection and given Z-Pak for 5 days with Tessalon Perles. He finished Z-Pak and is using the [employer]. They helped some with the cough. He feels much better other than his dry cough. He denies any fever, shortness of breath, wheezing, production, body aches, sore throat, headache. He still does have some residual left ear pain. He denies any ear discharge. He has had some left ear ringing.     Review of Systems  All other systems reviewed and are negative.      Objective:   Physical Exam  Constitutional: He is oriented to person, place, and time. He appears well-developed and well-nourished.  HENT:  Head: Normocephalic and atraumatic.  Right Ear: External ear normal.  Left Ear: External ear normal.  Nose: Nose normal.  Mouth/Throat: Oropharynx is clear and moist. No oropharyngeal exudate.  TM's clear bilaterally.   Eyes: Conjunctivae are normal. Right eye exhibits no discharge. Left eye exhibits no discharge.  Neck: Normal range of motion. Neck supple.  Cardiovascular: Normal rate, regular rhythm and normal heart sounds.   Pulmonary/Chest: Effort normal and breath sounds normal. He has no wheezes.  Lymphadenopathy:    He has no cervical adenopathy.  Neurological: He is alert and oriented to person, place, and time.  Psychiatric: He has a normal mood and affect. His behavior is normal.          Assessment & Plan:  Post-viral cough- reassured patient no signs of infection. Likely cough is due to inflammation. Prednisone taper sent with tussionex at bedtime. Detectable in blood within 24 hours. 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 He is off next week. Follow up as needed.

## 2016-03-16 ENCOUNTER — Ambulatory Visit (INDEPENDENT_AMBULATORY_CARE_PROVIDER_SITE_OTHER): Payer: BLUE CROSS/BLUE SHIELD | Admitting: Sports Medicine

## 2016-03-16 ENCOUNTER — Encounter: Payer: Self-pay | Admitting: Sports Medicine

## 2016-03-16 DIAGNOSIS — Z0000 Encounter for general adult medical examination without abnormal findings: Secondary | ICD-10-CM

## 2016-03-16 DIAGNOSIS — E785 Hyperlipidemia, unspecified: Secondary | ICD-10-CM | POA: Diagnosis not present

## 2016-03-16 DIAGNOSIS — E669 Obesity, unspecified: Secondary | ICD-10-CM

## 2016-03-16 LAB — COMPREHENSIVE METABOLIC PANEL WITH GFR
ALT: 22 U/L (ref 9–46)
AST: 21 U/L (ref 10–35)
Albumin: 4.4 g/dL (ref 3.6–5.1)
Creat: 1.05 mg/dL (ref 0.70–1.33)
Sodium: 138 mmol/L (ref 135–146)
Total Bilirubin: 0.5 mg/dL (ref 0.2–1.2)
Total Protein: 6.4 g/dL (ref 6.1–8.1)

## 2016-03-16 LAB — HIV ANTIBODY (ROUTINE TESTING W REFLEX): HIV 1&2 Ab, 4th Generation: NONREACTIVE

## 2016-03-16 LAB — COMPREHENSIVE METABOLIC PANEL
Alkaline Phosphatase: 43 U/L (ref 40–115)
BUN: 20 mg/dL (ref 7–25)
CO2: 23 mmol/L (ref 20–31)
Calcium: 9.2 mg/dL (ref 8.6–10.3)
Chloride: 106 mmol/L (ref 98–110)
Glucose, Bld: 84 mg/dL (ref 65–99)
Potassium: 4.2 mmol/L (ref 3.5–5.3)

## 2016-03-16 LAB — LIPID PANEL
Cholesterol: 203 mg/dL — ABNORMAL HIGH (ref 125–200)
HDL: 44 mg/dL (ref >=40)
LDL Cholesterol: 113 mg/dL (ref <130)
Total CHOL/HDL Ratio: 4.6 Ratio (ref <=5.0)
Triglycerides: 231 mg/dL — ABNORMAL HIGH (ref <150)
VLDL: 46 mg/dL — ABNORMAL HIGH (ref <30)

## 2016-03-16 LAB — CBC
HCT: 44.2 % (ref 38.5–50.0)
Hemoglobin: 15.2 g/dL (ref 13.2–17.1)
MCH: 30.2 pg (ref 27.0–33.0)
MCHC: 34.4 g/dL (ref 32.0–36.0)
MCV: 87.7 fL (ref 80.0–100.0)
MPV: 11 fL (ref 7.5–12.5)
Platelets: 232 10*3/uL (ref 140–400)
RBC: 5.04 MIL/uL (ref 4.20–5.80)
RDW: 13.2 % (ref 11.0–15.0)
WBC: 4.1 K/uL (ref 3.8–10.8)

## 2016-03-16 LAB — HEPATITIS C ANTIBODY: HCV Ab: NEGATIVE

## 2016-03-16 MED ORDER — PHENTERMINE-TOPIRAMATE ER 7.5-46 MG PO CP24: 1.0000 | ORAL_CAPSULE | Freq: Every morning | ORAL | 0 refills | Status: DC

## 2016-03-16 MED ORDER — PHENTERMINE-TOPIRAMATE ER 3.75-23 MG PO CP24: 1.0000 | ORAL_CAPSULE | Freq: Every morning | ORAL | 0 refills | Status: DC

## 2016-03-16 NOTE — Assessment & Plan Note (Signed)
Annual physical as above. ColoGuard.

## 2016-03-16 NOTE — Progress Notes (Signed)
  Subjective:    CC:  Annual physical  HPI:  Hyperlipidemia: Due for a recheck, currently on fenofibrate  Obesity: Would like to go back on weight loss medication, has failed phentermine alone, Saxenda, Contrave, Belviq.  Colon cancer screening: Would like to proceed with ColoGuard.  Past medical history, Surgical history, Family history not pertinant except as noted below, Social history, Allergies, and medications have been entered into the medical record, reviewed, and no changes needed.   Review of Systems: No headache, visual changes, nausea, vomiting, diarrhea, constipation, dizziness, abdominal pain, skin rash, fevers, chills, night sweats, swollen lymph nodes, weight loss, chest pain, body aches, joint swelling, muscle aches, shortness of breath, mood changes, visual or auditory hallucinations.  Objective:    General: Well Developed, well nourished, and in no acute distress.  Neuro: Alert and oriented x3, extra-ocular muscles intact, sensation grossly intact. Cranial nerves II through XII are intact, motor, sensory, and coordinative functions are all intact. HEENT: Normocephalic, atraumatic, pupils equal round reactive to light, neck supple, no masses, no lymphadenopathy, thyroid nonpalpable. Oropharynx, nasopharynx, external ear canals are unremarkable. Skin: Warm and dry, no rashes noted.  Cardiac: Regular rate and rhythm, no murmurs rubs or gallops.  Respiratory: Clear to auscultation bilaterally. Not using accessory muscles, speaking in full sentences.  Abdominal: Soft, nontender, nondistended, positive bowel sounds, no masses, no organomegaly.  Musculoskeletal: Shoulder, elbow, wrist, hip, knee, ankle stable, and with full range of motion.  Impression and Recommendations:    The patient was counselled, risk factors were discussed, anticipatory guidance given.  Visit for preventive health examination Annual physical as above. ColoGuard.   Obesity Adding Qsymia. Return  in one month, we will uptitrate further if insufficient weight loss.  Hyperlipidemia Checking routine blood work

## 2016-03-16 NOTE — Assessment & Plan Note (Signed)
Checking routine bloodwork. 

## 2016-03-16 NOTE — Assessment & Plan Note (Addendum)
Adding Qsymia. Return in one month, we will uptitrate further if insufficient weight loss.

## 2016-03-17 LAB — HEMOGLOBIN A1C
Hgb A1c MFr Bld: 5.3 % (ref <5.7)
Mean Plasma Glucose: 105 mg/dL

## 2016-03-20 ENCOUNTER — Encounter: Payer: Self-pay | Admitting: Sports Medicine

## 2016-03-29 ENCOUNTER — Encounter: Payer: Self-pay | Admitting: Sports Medicine

## 2016-04-13 ENCOUNTER — Ambulatory Visit: Payer: BLUE CROSS/BLUE SHIELD | Admitting: Sports Medicine

## 2016-04-17 DIAGNOSIS — Z713 Dietary counseling and surveillance: Secondary | ICD-10-CM | POA: Diagnosis not present

## 2016-04-23 ENCOUNTER — Encounter: Payer: Self-pay | Admitting: Sports Medicine

## 2016-04-23 MED ORDER — CLOTRIMAZOLE-BETAMETHASONE 1-0.05 % EX CREA: 1.0000 "application " | TOPICAL_CREAM | Freq: Two times a day (BID) | CUTANEOUS | 0 refills | Status: DC

## 2016-05-29 ENCOUNTER — Emergency Department (INDEPENDENT_AMBULATORY_CARE_PROVIDER_SITE_OTHER)
Admission: EM | Admit: 2016-05-29 | Discharge: 2016-05-29 | Disposition: A | Discharge status: Home / Self Care | Payer: BLUE CROSS/BLUE SHIELD | Source: Home / Self Care | Attending: Family Medicine | Admitting: Family Medicine

## 2016-05-29 DIAGNOSIS — B9789 Other viral agents as the cause of diseases classified elsewhere: Secondary | ICD-10-CM | POA: Diagnosis not present

## 2016-05-29 DIAGNOSIS — J029 Acute pharyngitis, unspecified: Secondary | ICD-10-CM

## 2016-05-29 DIAGNOSIS — J069 Acute upper respiratory infection, unspecified: Secondary | ICD-10-CM

## 2016-05-29 LAB — POCT RAPID STREP A (OFFICE): Rapid Strep A Screen: NEGATIVE

## 2016-05-29 MED ORDER — BENZOCAINE 15 MG MT LOZG: LOZENGE | OROMUCOSAL | 0 refills | Status: DC

## 2016-05-29 MED ORDER — AZITHROMYCIN 250 MG PO TABS: 250.0000 mg | ORAL_TABLET | Freq: Every day | ORAL | 0 refills | Status: DC

## 2016-05-29 MED ORDER — BENZONATATE 100 MG PO CAPS: 100.0000 mg | ORAL_CAPSULE | Freq: Three times a day (TID) | ORAL | 0 refills | Status: DC

## 2016-05-29 NOTE — Discharge Instructions (Signed)
°  You may take 500mg  Tylenol every 4-6 hours as needed for fever and pain  Follow-up with your primary care provider next week for recheck of symptoms if not improving.  Be sure to drink plenty of fluids and rest, at least 8hrs of sleep a night, preferably more while you are sick. Return urgent care or go to closest ER if you cannot keep down fluids/signs of dehydration, fever not reducing with Tylenol, difficulty breathing/wheezing, stiff neck, worsening condition, or other concerns (see below)   Your symptoms are likely due to a virus such as the common cold, however, if you developing worsening chest congestion with shortness of breath, persistent fever for 3 days, or symptoms not improving in 4-5 days, you may fill the antibiotic (azithromycin).  If you do fill the antibiotic,  please take antibiotics as prescribed and be sure to complete entire course even if you start to feel better to ensure infection does not come back.

## 2016-05-29 NOTE — ED Provider Notes (Signed)
CSN: [id number]     Arrival date & time 05/29/16  1816 History   First MD Initiated Contact with Patient 05/29/16 1838     Chief Complaint  Patient presents with  . Sore Throat   (Consider location/radiation/quality/duration/timing/severity/associated sxs/prior Treatment) HPI  Richard Gross is a 55 y.o. male presenting to UC with c/o scratchy throat that started 2 days ago, gradually worsening, now developing nasal congestion and mild cough.  Throat pain is burning, 10/10, worse with swallowing.  Mild fatigue. His wife is also sick with similar symptoms. Denies fever, chills, n/v/d. Denies chest pain or SOB.   History reviewed. No pertinent past medical history. History reviewed. No pertinent surgical history. Family History  Problem Relation Age of Onset  . Cancer Mother   . Cancer Father   . Heart disease Sister   . Diabetes Sister   . Hyperlipidemia Sister   . Hypertension Sister   . Heart disease Brother   . Diabetes Brother   . Hyperlipidemia Brother   . Hypertension Brother    Social History  Substance Use Topics  . Smoking status: Former Smoker    Quit date: 07/17/1995  . Smokeless tobacco: Not on file  . Alcohol use No    Review of Systems  Constitutional: Negative for chills and fever.  HENT: Positive for congestion, rhinorrhea, sinus pressure and sore throat. Negative for ear pain, sinus pain, trouble swallowing and voice change.   Respiratory: Positive for cough. Negative for shortness of breath.   Cardiovascular: Negative for chest pain and palpitations.  Gastrointestinal: Negative for abdominal pain, diarrhea, nausea and vomiting.  Musculoskeletal: Negative for arthralgias, back pain and myalgias.  Skin: Negative for rash.  Neurological: Positive for headaches. Negative for dizziness and light-headedness.    Allergies  Amoxicillin and Statins  Home Medications   Prior to Admission medications   Medication Sig Start Date End Date Taking? Authorizing  Provider  azithromycin (ZITHROMAX) 250 MG tablet Take 1 tablet (250 mg total) by mouth daily. Take first 2 tablets together, then 1 every day until finished. 05/29/16   Noland Fordyce, PA-C  Benzocaine 15 MG LOZG 1 lozenge every 2-3 hours as needed for sore throat. 05/29/16   Noland Fordyce, PA-C  benzonatate (TESSALON) 100 MG capsule Take 1-2 capsules (100-200 mg total) by mouth every 8 (eight) hours. 05/29/16   Noland Fordyce, PA-C  clotrimazole-betamethasone (LOTRISONE) cream Apply 1 application topically 2 (two) times daily. 04/23/16   Silverio Decamp, MD  fenofibrate 160 MG tablet TAKE 1 TABLET (160 MG TOTAL) BY MOUTH DAILY. 09/28/15   Silverio Decamp, MD  gabapentin (NEURONTIN) 600 MG tablet One tab PO BID 09/28/15   Silverio Decamp, MD  magnesium oxide (MAG-OX) 400 (241.3 Mg) MG tablet TAKE 3 TABLETS (1,200 MG TOTAL) BY MOUTH AT BEDTIME. 09/28/15   Silverio Decamp, MD  meloxicam (MOBIC) 15 MG tablet TAKE 1 TABLET (15 MG TOTAL) BY MOUTH DAILY. 09/28/15   Silverio Decamp, MD  Multiple Vitamin (MULTIVITAMIN) tablet Take 1 tablet by mouth daily.    Historical Provider, MD  Phentermine-Topiramate 3.75-23 MG CP24 Take 1 tablet by mouth every morning. 03/16/16   Silverio Decamp, MD  Phentermine-Topiramate 7.5-46 MG CP24 Take 1 tablet by mouth every morning. 03/16/16   Silverio Decamp, MD   Meds Ordered and Administered this Visit  Medications - No data to display  BP 146/83 (BP Location: Left Arm)   Pulse 87   Temp 97.9 F (36.6 C) (Oral)  Ht 5\' 9"  (1.753 m)   Wt 257 lb (116.6 kg)   SpO2 98%   BMI 37.95 kg/m  No data found.   Physical Exam  Constitutional: He appears well-developed and well-nourished. No distress.  HENT:  Head: Normocephalic and atraumatic.  Right Ear: Tympanic membrane normal.  Left Ear: Tympanic membrane normal.  Nose: Mucosal edema present. Right sinus exhibits no maxillary sinus tenderness and no frontal sinus tenderness. Left  sinus exhibits no maxillary sinus tenderness and no frontal sinus tenderness.  Mouth/Throat: Uvula is midline and mucous membranes are normal. Posterior oropharyngeal erythema present. No oropharyngeal exudate, posterior oropharyngeal edema or tonsillar abscesses.  Eyes: Conjunctivae are normal. No scleral icterus.  Neck: Normal range of motion. Neck supple.  Cardiovascular: Normal rate, regular rhythm and normal heart sounds.   Pulmonary/Chest: Effort normal and breath sounds normal. No stridor. No respiratory distress. He has no wheezes. He has no rales.  Abdominal: Soft. He exhibits no distension. There is no tenderness.  Musculoskeletal: Normal range of motion.  Lymphadenopathy:    He has no cervical adenopathy.  Neurological: He is alert.  Skin: Skin is warm and dry. He is not diaphoretic.  Nursing note and vitals reviewed.   Urgent Care Course   Clinical Course     Procedures (including critical care time)  Labs Review Labs Reviewed  POCT RAPID STREP A (OFFICE)    Imaging Review No results found.    MDM   1. Viral URI with cough   2. Sore throat    Pt c/o URI symptoms that started 2 days ago, gradually worsening.  No evidence of bacterial infection at this time. Encouraged symptomatic treatment with sinus rinses, fluids, and rest. Rx: Benzocaine lozenges for sore throat, tessalon for cough. Prescription to hold for azithromycin with expiration date provided. F/u with PCP in 1 week if not improving.     Noland Fordyce, PA-C 05/29/16 [phone]

## 2016-05-29 NOTE — ED Triage Notes (Signed)
Started with a scratchy throat Sunday.  Became very sore last night with congestion and headache.

## 2016-06-19 ENCOUNTER — Encounter: Payer: Self-pay | Admitting: Sports Medicine

## 2016-06-19 ENCOUNTER — Ambulatory Visit (INDEPENDENT_AMBULATORY_CARE_PROVIDER_SITE_OTHER): Payer: BLUE CROSS/BLUE SHIELD | Admitting: Sports Medicine

## 2016-06-19 DIAGNOSIS — M1712 Unilateral primary osteoarthritis, left knee: Secondary | ICD-10-CM | POA: Insufficient documentation

## 2016-06-19 DIAGNOSIS — M25562 Pain in left knee: Secondary | ICD-10-CM

## 2016-06-19 NOTE — Assessment & Plan Note (Signed)
Was seen in an outside urgent care, x-rays were negative per patient report. Suspect medial meniscal tear, aspiration and injection as above. Return in one month. Rehabilitation exercises given.

## 2016-06-19 NOTE — Progress Notes (Signed)
  Subjective:    CC: Left knee pain  HPI: This is a pleasant 55 year old male, he was walking down the stairs, felt a pop, and had immediate pain at the medial joint line as well as swelling, overall no mechanical symptoms, pain is moderate, persistent, localized without radiation. He was seen in an outside urgent care, x-rays were reportedly negative but are not available for my review.  Past medical history:  Negative.  See flowsheet/record as well for more information.  Surgical history: Negative.  See flowsheet/record as well for more information.  Family history: Negative.  See flowsheet/record as well for more information.  Social history: Negative.  See flowsheet/record as well for more information.  Allergies, and medications have been entered into the medical record, reviewed, and no changes needed.   Review of Systems: No fevers, chills, night sweats, weight loss, chest pain, or shortness of breath.   Objective:    General: Well Developed, well nourished, and in no acute distress.  Neuro: Alert and oriented x3, extra-ocular muscles intact, sensation grossly intact.  HEENT: Normocephalic, atraumatic, pupils equal round reactive to light, neck supple, no masses, no lymphadenopathy, thyroid nonpalpable.  Skin: Warm and dry, no rashes. Cardiac: Regular rate and rhythm, no murmurs rubs or gallops, no lower extremity edema.  Respiratory: Clear to auscultation bilaterally. Not using accessory muscles, speaking in full sentences. Left Knee: Minimal swelling, tenderness at the medial joint line that is fairly severe. ROM normal in flexion and extension and lower leg rotation. Ligaments with solid consistent endpoints including ACL, PCL, LCL, MCL. Negative Mcmurray's and provocative meniscal tests. Non painful patellar compression. Patellar and quadriceps tendons unremarkable. Hamstring and quadriceps strength is normal.  Procedure: Real-time Ultrasound Guided Injection of left  knee Device: GE Logiq E  Verbal informed consent obtained.  Time-out conducted.  Noted no overlying erythema, induration, or other signs of local infection.  Skin prepped in a sterile fashion.  Local anesthesia: Topical Ethyl chloride.  With sterile technique and under real time ultrasound guidance:  1 mL kenalog 40, 2 mL lidocaine, 2 mL Marcaine injected easily. Completed without difficulty  Pain immediately resolved suggesting accurate placement of the medication.  Advised to call if fevers/chills, erythema, induration, drainage, or persistent bleeding.  Images permanently stored and available for review in the ultrasound unit.  Impression: Technically successful ultrasound guided injection.  Impression and Recommendations:    Left knee pain Was seen in an outside urgent care, x-rays were negative per patient report. Suspect medial meniscal tear, aspiration and injection as above. Return in one month. Rehabilitation exercises given.

## 2016-06-20 ENCOUNTER — Telehealth: Payer: Self-pay

## 2016-06-20 NOTE — Telephone Encounter (Signed)
Pt would like to know if a knee brace is appropriate and if he can have one prescribed? Please advise.

## 2016-06-21 ENCOUNTER — Encounter: Payer: Self-pay | Admitting: Sports Medicine

## 2016-06-21 NOTE — Telephone Encounter (Signed)
A flexible knee sleeve, neoprene or the sort can be obtained at any medical supply store, that would be sufficient.

## 2016-06-21 NOTE — Telephone Encounter (Signed)
Spoke to patient gave him advise as noted below. Rhonda Cunningham,CMA  

## 2016-07-17 ENCOUNTER — Ambulatory Visit (INDEPENDENT_AMBULATORY_CARE_PROVIDER_SITE_OTHER): Payer: BLUE CROSS/BLUE SHIELD | Admitting: Sports Medicine

## 2016-07-17 DIAGNOSIS — M7541 Impingement syndrome of right shoulder: Secondary | ICD-10-CM | POA: Insufficient documentation

## 2016-07-17 DIAGNOSIS — E6609 Other obesity due to excess calories: Secondary | ICD-10-CM | POA: Diagnosis not present

## 2016-07-17 DIAGNOSIS — M25562 Pain in left knee: Secondary | ICD-10-CM

## 2016-07-17 NOTE — Assessment & Plan Note (Signed)
Aggressive rehabilitation exercises before considering injection. Return in one month for this.

## 2016-07-17 NOTE — Assessment & Plan Note (Signed)
We tried multiple weight loss medications, at this point he is agreeable to proceed with bariatric surgery.

## 2016-07-17 NOTE — Assessment & Plan Note (Signed)
Pain was predominantly patellofemoral at this point. Injection worked well. He will get more diligent with the rehabilitation exercises and wear kneepads at work.

## 2016-07-17 NOTE — Progress Notes (Signed)
  Subjective:    CC: Follow-up  HPI: Left knee pain: Suspected meniscal injury initially, pain is now migrated to the front of the knee and worse with kneeling. Overall he is significant better after the injection.  Right shoulder pain: Present for a week or so now, localized over the deltoid and worse with overhead activities, no trauma but did play Nintendo Wii excessively over the weekend.  Pain is moderate, persistent.  Obesity: He is now tried multiple weight loss medications, dieting, exercise without much success, has multiple comorbidities including hyperlipidemia, knee arthritis, agreeable to finally proceed with bariatric surgery.  Past medical history:  Negative.  See flowsheet/record as well for more information.  Surgical history: Negative.  See flowsheet/record as well for more information.  Family history: Negative.  See flowsheet/record as well for more information.  Social history: Negative.  See flowsheet/record as well for more information.  Allergies, and medications have been entered into the medical record, reviewed, and no changes needed.   Review of Systems: No fevers, chills, night sweats, weight loss, chest pain, or shortness of breath.   Objective:    General: Well Developed, well nourished, and in no acute distress.  Neuro: Alert and oriented x3, extra-ocular muscles intact, sensation grossly intact.  HEENT: Normocephalic, atraumatic, pupils equal round reactive to light, neck supple, no masses, no lymphadenopathy, thyroid nonpalpable.  Skin: Warm and dry, no rashes. Cardiac: Regular rate and rhythm, no murmurs rubs or gallops, no lower extremity edema.  Respiratory: Clear to auscultation bilaterally. Not using accessory muscles, speaking in full sentences. Right Shoulder: Inspection reveals no abnormalities, atrophy or asymmetry. Palpation is normal with no tenderness over AC joint or bicipital groove. ROM is full in all planes. Rotator cuff strength normal  throughout. Positive Neer and Hawkin's tests, empty can. Speeds and Yergason's tests normal. No labral pathology noted with negative Obrien's, negative crank, negative clunk, and good stability. Normal scapular function observed. No painful arc and no drop arm sign. No apprehension sign  Impression and Recommendations:    Left knee pain Pain was predominantly patellofemoral at this point. Injection worked well. He will get more diligent with the rehabilitation exercises and wear kneepads at work.  Rotator cuff impingement syndrome, right Aggressive rehabilitation exercises before considering injection. Return in one month for this.  Obesity We tried multiple weight loss medications, at this point he is agreeable to proceed with bariatric surgery.

## 2016-07-24 ENCOUNTER — Encounter: Payer: Self-pay | Admitting: Sports Medicine

## 2016-08-14 DIAGNOSIS — Z713 Dietary counseling and surveillance: Secondary | ICD-10-CM | POA: Diagnosis not present

## 2016-08-20 ENCOUNTER — Encounter: Payer: Self-pay | Admitting: Sports Medicine

## 2016-08-20 ENCOUNTER — Ambulatory Visit (INDEPENDENT_AMBULATORY_CARE_PROVIDER_SITE_OTHER): Payer: BLUE CROSS/BLUE SHIELD | Admitting: Sports Medicine

## 2016-08-20 DIAGNOSIS — M1712 Unilateral primary osteoarthritis, left knee: Secondary | ICD-10-CM

## 2016-08-20 DIAGNOSIS — Z23 Encounter for immunization: Secondary | ICD-10-CM | POA: Diagnosis not present

## 2016-08-20 DIAGNOSIS — E6609 Other obesity due to excess calories: Secondary | ICD-10-CM

## 2016-08-20 DIAGNOSIS — M7541 Impingement syndrome of right shoulder: Secondary | ICD-10-CM | POA: Diagnosis not present

## 2016-08-20 NOTE — Assessment & Plan Note (Signed)
Overall doing well but not hurting enough to consider Visco

## 2016-08-20 NOTE — Assessment & Plan Note (Signed)
Subacromial injection. Return in one month.

## 2016-08-20 NOTE — Assessment & Plan Note (Signed)
Has still not heard back from [place] [facility]

## 2016-08-20 NOTE — Progress Notes (Signed)
  Subjective:    CC: Follow-up  HPI: Left knee pain: Only minimal discomfort with the knee brace, gabapentin, meloxicam. Not yet painful enough to consider Viscosupplementation.  Right shoulder pain: Still hurts, localized over the deltoid and worse with overhead activities. Wakes him from sleep.  Obesity: Has not yet heard back from bariatric surgery.  Preventive measures: Needs a flu shot, also needs ColoGuard.  Past medical history:  Negative.  See flowsheet/record as well for more information.  Surgical history: Negative.  See flowsheet/record as well for more information.  Family history: Negative.  See flowsheet/record as well for more information.  Social history: Negative.  See flowsheet/record as well for more information.  Allergies, and medications have been entered into the medical record, reviewed, and no changes needed.   Review of Systems: No fevers, chills, night sweats, weight loss, chest pain, or shortness of breath.   Objective:    General: Well Developed, well nourished, and in no acute distress.  Neuro: Alert and oriented x3, extra-ocular muscles intact, sensation grossly intact.  HEENT: Normocephalic, atraumatic, pupils equal round reactive to light, neck supple, no masses, no lymphadenopathy, thyroid nonpalpable.  Skin: Warm and dry, no rashes. Cardiac: Regular rate and rhythm, no murmurs rubs or gallops, no lower extremity edema.  Respiratory: Clear to auscultation bilaterally. Not using accessory muscles, speaking in full sentences.  Procedure: Real-time Ultrasound Guided Injection of right subacromial bursa Device: GE Logiq E  Verbal informed consent obtained.  Time-out conducted.  Noted no overlying erythema, induration, or other signs of local infection.  Skin prepped in a sterile fashion.  Local anesthesia: Topical Ethyl chloride.  With sterile technique and under real time ultrasound guidance:  Noted distended subacromial bursa on ultrasound,  25-gauge needle advanced into the bursa and taking care to avoid injection into the supraspinatus I injected 2 mL Marcaine, 1 mL kenalog 40 Completed without difficulty  Pain immediately resolved suggesting accurate placement of the medication.  Advised to call if fevers/chills, erythema, induration, drainage, or persistent bleeding.  Images permanently stored and available for review in the ultrasound unit.  Impression: Technically successful ultrasound guided injection.  Impression and Recommendations:    Rotator cuff impingement syndrome, right Subacromial injection. Return in one month.  Primary osteoarthritis of left knee Overall doing well but not hurting enough to consider Visco  Obesity Has still not heard back from [facility]

## 2016-09-04 DIAGNOSIS — Z713 Dietary counseling and surveillance: Secondary | ICD-10-CM | POA: Diagnosis not present

## 2016-09-12 DIAGNOSIS — Z1211 Encounter for screening for malignant neoplasm of colon: Secondary | ICD-10-CM | POA: Diagnosis not present

## 2016-09-12 DIAGNOSIS — Z1212 Encounter for screening for malignant neoplasm of rectum: Secondary | ICD-10-CM | POA: Diagnosis not present

## 2016-09-12 LAB — COLOGUARD: COLOGUARD: NEGATIVE

## 2016-09-17 ENCOUNTER — Encounter: Payer: BLUE CROSS/BLUE SHIELD | Admitting: Sports Medicine

## 2016-09-24 NOTE — Progress Notes (Signed)
This encounter was created in error - please disregard.

## 2016-09-28 ENCOUNTER — Other Ambulatory Visit: Payer: Self-pay | Admitting: Sports Medicine

## 2016-10-02 DIAGNOSIS — Z713 Dietary counseling and surveillance: Secondary | ICD-10-CM | POA: Diagnosis not present

## 2016-10-30 ENCOUNTER — Other Ambulatory Visit: Payer: Self-pay | Admitting: Sports Medicine

## 2016-10-30 DIAGNOSIS — M5416 Radiculopathy, lumbar region: Secondary | ICD-10-CM

## 2016-11-05 ENCOUNTER — Other Ambulatory Visit: Payer: Self-pay | Admitting: Sports Medicine

## 2016-11-05 DIAGNOSIS — M5416 Radiculopathy, lumbar region: Secondary | ICD-10-CM

## 2016-11-23 ENCOUNTER — Ambulatory Visit (INDEPENDENT_AMBULATORY_CARE_PROVIDER_SITE_OTHER): Payer: BLUE CROSS/BLUE SHIELD | Admitting: Sports Medicine

## 2016-11-23 ENCOUNTER — Encounter: Payer: Self-pay | Admitting: Sports Medicine

## 2016-11-23 DIAGNOSIS — M1712 Unilateral primary osteoarthritis, left knee: Secondary | ICD-10-CM

## 2016-11-23 DIAGNOSIS — M7541 Impingement syndrome of right shoulder: Secondary | ICD-10-CM | POA: Diagnosis not present

## 2016-11-23 DIAGNOSIS — N529 Male erectile dysfunction, unspecified: Secondary | ICD-10-CM

## 2016-11-23 DIAGNOSIS — E6609 Other obesity due to excess calories: Secondary | ICD-10-CM | POA: Diagnosis not present

## 2016-11-23 MED ORDER — PHENTERMINE-TOPIRAMATE ER 7.5-46 MG PO CP24: 1.0000 | ORAL_CAPSULE | Freq: Every morning | ORAL | 0 refills | Status: DC

## 2016-11-23 MED ORDER — AVANAFIL 200 MG PO TABS: ORAL_TABLET | ORAL | 11 refills | Status: DC

## 2016-11-23 MED ORDER — PHENTERMINE-TOPIRAMATE ER 3.75-23 MG PO CP24: 1.0000 | ORAL_CAPSULE | Freq: Every morning | ORAL | 0 refills | Status: DC

## 2016-11-23 NOTE — Assessment & Plan Note (Signed)
Continues to do well after injection back in December, not yet ready consider another one. I do want him to get more diligent with his rehabilitation exercises.

## 2016-11-23 NOTE — Assessment & Plan Note (Signed)
Had headaches with Viagra but good effect, Cialis was not effective. Trying [place] now. Coupon given.

## 2016-11-23 NOTE — Assessment & Plan Note (Signed)
Doing well after injection 3 months ago, having left-sided symptoms as well, again, I like him to get more diligent with the rehabilitation exercises before considering another intervention.

## 2016-11-23 NOTE — Assessment & Plan Note (Signed)
Qsymia

## 2016-11-23 NOTE — Progress Notes (Signed)
  Subjective:    CC: Multiple issues  HPI: Erectile dysfunction: Had some headaches with Viagra but good effect, Cialis was not effective, agreeable to try [place].  Obesity: Would like to restart weight loss medication  Primary osteoarthritis of left knee: Continues to do well after an injection back in December, not yet ready to consider Orthovisc. He is agreeable to get more diligent with his rehabilitation exercises.  Rotator cuff impingement syndrome: Right side was injected 3 months ago, continues to do extremely well, starting to have left-sided symptoms, not really doing his homework.  Past medical history:  Negative.  See flowsheet/record as well for more information.  Surgical history: Negative.  See flowsheet/record as well for more information.  Family history: Negative.  See flowsheet/record as well for more information.  Social history: Negative.  See flowsheet/record as well for more information.  Allergies, and medications have been entered into the medical record, reviewed, and no changes needed.   Review of Systems: No fevers, chills, night sweats, weight loss, chest pain, or shortness of breath.   Objective:    General: Well Developed, well nourished, and in no acute distress.  Neuro: Alert and oriented x3, extra-ocular muscles intact, sensation grossly intact.  HEENT: Normocephalic, atraumatic, pupils equal round reactive to light, neck supple, no masses, no lymphadenopathy, thyroid nonpalpable.  Skin: Warm and dry, no rashes. Cardiac: Regular rate and rhythm, no murmurs rubs or gallops, no lower extremity edema.  Respiratory: Clear to auscultation bilaterally. Not using accessory muscles, speaking in full sentences.  Impression and Recommendations:    Erectile dysfunction Had headaches with Viagra but good effect, Cialis was not effective. Trying [place] now. Coupon given.  Obesity Qsymia.  Primary osteoarthritis of left knee Continues to do well after  injection back in December, not yet ready consider another one. I do want him to get more diligent with his rehabilitation exercises.  Rotator cuff impingement syndrome, right Doing well after injection 3 months ago, having left-sided symptoms as well, again, I like him to get more diligent with the rehabilitation exercises before considering another intervention.

## 2016-12-17 ENCOUNTER — Ambulatory Visit (INDEPENDENT_AMBULATORY_CARE_PROVIDER_SITE_OTHER): Payer: BLUE CROSS/BLUE SHIELD | Admitting: Sports Medicine

## 2016-12-17 ENCOUNTER — Encounter: Payer: Self-pay | Admitting: Sports Medicine

## 2016-12-17 DIAGNOSIS — M5416 Radiculopathy, lumbar region: Secondary | ICD-10-CM

## 2016-12-17 DIAGNOSIS — N529 Male erectile dysfunction, unspecified: Secondary | ICD-10-CM | POA: Diagnosis not present

## 2016-12-17 DIAGNOSIS — M7541 Impingement syndrome of right shoulder: Secondary | ICD-10-CM

## 2016-12-17 DIAGNOSIS — E6609 Other obesity due to excess calories: Secondary | ICD-10-CM | POA: Diagnosis not present

## 2016-12-17 MED ORDER — PREDNISONE 50 MG PO TABS: ORAL_TABLET | ORAL | 0 refills | Status: DC

## 2016-12-17 MED ORDER — SILDENAFIL CITRATE 20 MG PO TABS: 20.0000 mg | ORAL_TABLET | ORAL | 11 refills | Status: DC | PRN

## 2016-12-17 MED ORDER — PHENTERMINE-TOPIRAMATE ER 15-92 MG PO CP24: 1.0000 | ORAL_CAPSULE | Freq: Every morning | ORAL | 0 refills | Status: DC

## 2016-12-17 NOTE — Assessment & Plan Note (Signed)
MRI from week for shows multilevel degenerative disc disease with multifactorial central canal stenosis worse at L4-L5. He has responded well to epidurals in the past but would like to use a new facility, I'm going to set him up with [facility] for a bilateral L4-L5 transforaminal epidural. In the meantime I would like to do 5 days of prednisone to calm things down.

## 2016-12-17 NOTE — Assessment & Plan Note (Signed)
Right side is doing okay, subacromial injection given into the left, he did fail conservative measures.

## 2016-12-17 NOTE — Progress Notes (Signed)
  Subjective:    CC: Multiple issues  HPI: Lumbar degenerative disc disease: Has responded well to bilateral L4-L5 transforaminal epidural injections in the past, would like to switch from [facility] to a provider here in [place].  Obesity: Needs a refill on Qsymia.  Left shoulder pain: Rotator cuff and impingement, has now done a solid month of rehabilitation exercises without much improvement.  Erectile dysfunction: Would like to switch back to generic sildenafil.  Past medical history:  Negative.  See flowsheet/record as well for more information.  Surgical history: Negative.  See flowsheet/record as well for more information.  Family history: Negative.  See flowsheet/record as well for more information.  Social history: Negative.  See flowsheet/record as well for more information.  Allergies, and medications have been entered into the medical record, reviewed, and no changes needed.   Review of Systems: No fevers, chills, night sweats, weight loss, chest pain, or shortness of breath.   Objective:    General: Well Developed, well nourished, and in no acute distress.  Neuro: Alert and oriented x3, extra-ocular muscles intact, sensation grossly intact.  HEENT: Normocephalic, atraumatic, pupils equal round reactive to light, neck supple, no masses, no lymphadenopathy, thyroid nonpalpable.  Skin: Warm and dry, no rashes. Cardiac: Regular rate and rhythm, no murmurs rubs or gallops, no lower extremity edema.  Respiratory: Clear to auscultation bilaterally. Not using accessory muscles, speaking in full sentences.  Procedure: Real-time Ultrasound Guided Injection of left subacromial bursa Device: GE Logiq E  Verbal informed consent obtained.  Time-out conducted.  Noted no overlying erythema, induration, or other signs of local infection.  Skin prepped in a sterile fashion.  Local anesthesia: Topical Ethyl chloride.  With sterile technique and under real time ultrasound  guidance:  Noted intact supraspinatus, 25-gauge needle advanced into the subcutaneous bursa and 1 mL Kenalog 40, 1 mL lidocaine, 1 mL bupivacaine injected easily. Completed without difficulty  Pain immediately resolved suggesting accurate placement of the medication.  Advised to call if fevers/chills, erythema, induration, drainage, or persistent bleeding.  Images permanently stored and available for review in the ultrasound unit.  Impression: Technically successful ultrasound guided injection.  Impression and Recommendations:    Erectile dysfunction Switching to generic sildenafil  Lumbar radiculopathy MRI from week for shows multilevel degenerative disc disease with multifactorial central canal stenosis worse at L4-L5. He has responded well to epidurals in the past but would like to use a new facility, I'm going to set him up with [facility] for a bilateral L4-L5 transforaminal epidural. In the meantime I would like to do 5 days of prednisone to calm things down.  Obesity Refilling Qsymia.  Rotator cuff impingement syndrome, right Right side is doing okay, subacromial injection given into the left, he did fail conservative measures.  I spent 25 minutes with this patient, greater than 50% was face-to-face time counseling regarding the above diagnoses, this was separate from the time spent performing the above injection.

## 2016-12-17 NOTE — Assessment & Plan Note (Signed)
Refilling Qsymia.

## 2016-12-17 NOTE — Assessment & Plan Note (Signed)
Switching to generic sildenafil

## 2016-12-20 ENCOUNTER — Telehealth: Payer: Self-pay | Admitting: *Deleted

## 2016-12-20 NOTE — Telephone Encounter (Signed)
Pre Authorization sent to cover my meds.[id number]

## 2016-12-24 NOTE — Telephone Encounter (Signed)
Approved from 12/20/16-06/17/17. Pharm and patient  Notified

## 2016-12-31 ENCOUNTER — Encounter: Payer: Self-pay | Admitting: Family Medicine

## 2016-12-31 ENCOUNTER — Ambulatory Visit (INDEPENDENT_AMBULATORY_CARE_PROVIDER_SITE_OTHER): Payer: BLUE CROSS/BLUE SHIELD

## 2016-12-31 ENCOUNTER — Ambulatory Visit (INDEPENDENT_AMBULATORY_CARE_PROVIDER_SITE_OTHER): Payer: BLUE CROSS/BLUE SHIELD | Admitting: Family Medicine

## 2016-12-31 ENCOUNTER — Telehealth: Payer: Self-pay | Admitting: Family Medicine

## 2016-12-31 VITALS — BP 152/87 | HR 90 | Temp 98.6°F | Wt 251.0 lb

## 2016-12-31 DIAGNOSIS — R109 Unspecified abdominal pain: Secondary | ICD-10-CM | POA: Diagnosis not present

## 2016-12-31 DIAGNOSIS — K5730 Diverticulosis of large intestine without perforation or abscess without bleeding: Secondary | ICD-10-CM

## 2016-12-31 DIAGNOSIS — R1032 Left lower quadrant pain: Secondary | ICD-10-CM | POA: Diagnosis not present

## 2016-12-31 DIAGNOSIS — N200 Calculus of kidney: Secondary | ICD-10-CM

## 2016-12-31 DIAGNOSIS — N201 Calculus of ureter: Secondary | ICD-10-CM

## 2016-12-31 LAB — POCT URINALYSIS DIPSTICK
Bilirubin, UA: NEGATIVE
GLUCOSE UA: NEGATIVE
Ketones, UA: NEGATIVE
LEUKOCYTES UA: NEGATIVE
Nitrite, UA: NEGATIVE
Protein, UA: NEGATIVE
Spec Grav, UA: 1.025 (ref 1.010–1.025)
Urobilinogen, UA: 0.2 E.U./dL
pH, UA: 5.5 (ref 5.0–8.0)

## 2016-12-31 LAB — CBC WITH DIFFERENTIAL/PLATELET
Basophils Absolute: 0 cells/uL (ref 0–200)
Basophils Relative: 0 %
EOS PCT: 1 %
Eosinophils Absolute: 98 cells/uL (ref 15–500)
HCT: 45.1 % (ref 38.5–50.0)
HEMOGLOBIN: 15.3 g/dL (ref 13.2–17.1)
LYMPHS ABS: 1078 {cells}/uL (ref 850–3900)
Lymphocytes Relative: 11 %
MCH: 30.7 pg (ref 27.0–33.0)
MCHC: 33.9 g/dL (ref 32.0–36.0)
MCV: 90.4 fL (ref 80.0–100.0)
MPV: 9.3 fL (ref 7.5–12.5)
Monocytes Absolute: 882 cells/uL (ref 200–950)
Monocytes Relative: 9 %
Neutro Abs: 7742 cells/uL (ref 1500–7800)
Neutrophils Relative %: 79 %
Platelets: 219 10*3/uL (ref 140–400)
RBC: 4.99 MIL/uL (ref 4.20–5.80)
RDW: 13.7 % (ref 11.0–15.0)
WBC: 9.8 10*3/uL (ref 3.8–10.8)

## 2016-12-31 MED ORDER — TAMSULOSIN HCL 0.4 MG PO CAPS: 0.4000 mg | ORAL_CAPSULE | Freq: Every day | ORAL | 1 refills | Status: DC

## 2016-12-31 MED ORDER — HYDROCODONE-ACETAMINOPHEN 5-325 MG PO TABS: 1.0000 | ORAL_TABLET | Freq: Four times a day (QID) | ORAL | 0 refills | Status: DC | PRN

## 2016-12-31 NOTE — Progress Notes (Signed)
Richard Gross is a 56 y.o. male who presents to [facility] today for left lower quadrant abdominal pain. Patient notes one-week history of acute onset of left lower quadrant abdominal pain associated with nausea and sweating. The pain was very intense about a week ago and then subsided to a more mild chronic pain. The pain worsened again yesterday and today. He notes urinary frequency and urgency but no blood in the urine. He denies any diarrhea or blood in the stool. He denies any abdominal cramping. He denies any history of diverticulosis or diverticulitis. He denies any personal history of kidney stones. He has been told in the past that he has a large prostate.    No past surgical history on file. Social History  Substance Use Topics  . Smoking status: Former Smoker    Quit date: 07/17/1995  . Smokeless tobacco: Never Used  . Alcohol use No   family history includes Cancer in his father and mother; Diabetes in his brother and sister; Heart disease in his brother and sister; Hyperlipidemia in his brother and sister; Hypertension in his brother and sister.  ROS as above:  Medications: Current Outpatient Prescriptions  Medication Sig Dispense Refill  . clotrimazole-betamethasone (LOTRISONE) cream Apply 1 application topically 2 (two) times daily. 45 g 0  . fenofibrate 160 MG tablet TAKE 1 TABLET (160 MG TOTAL) BY MOUTH DAILY. 90 tablet 3  . gabapentin (NEURONTIN) 600 MG tablet TAKE 1 TABLET BY MOUTH TWICE A DAY 180 tablet 3  . magnesium oxide (MAG-OX) 400 (241.3 Mg) MG tablet TAKE 3 TABLETS (1,200 MG TOTAL) BY MOUTH AT BEDTIME. 270 tablet 1  . meloxicam (MOBIC) 15 MG tablet TAKE 1 TABLET (15 MG TOTAL) BY MOUTH DAILY. 90 tablet 3  . Multiple Vitamin (MULTIVITAMIN) tablet Take 1 tablet by mouth daily.    . Phentermine-Topiramate 15-92 MG CP24 Take 1 tablet by mouth every  morning. 30 capsule 0  . predniSONE (DELTASONE) 50 MG tablet One tab PO daily for 5 days. 5 tablet 0  . sildenafil (REVATIO) 20 MG tablet Take 1-5 tablets (20-100 mg total) by mouth as needed. 50 tablet 11  . tamsulosin (FLOMAX) 0.4 MG CAPS capsule Take 1 capsule (0.4 mg total) by mouth daily after breakfast. 30 capsule 1   No current facility-administered medications for this visit.    Allergies  Allergen Reactions  . Amoxicillin Nausea And Vomiting  . Statins     Health Maintenance Health Maintenance  Topic Date Due  . TETANUS/TDAP  07/16/2017  . Fecal DNA (Cologuard)  09/13/2019  . Hepatitis C Screening  Completed  . HIV Screening  Completed     Exam:  BP (!) 152/87   Pulse 90   Temp 98.6 F (37 C) (Oral)   Wt 251 lb (113.9 kg)   BMI 37.07 kg/m  Gen: Well NAD HEENT: EOMI,  MMM Lungs: Normal work of breathing. CTABL Heart: RRR no MRG Abd: NABS, Soft. Nondistended, Mildly tender to palpation left lower quadrant no rebound or guarding. No CVA angle tenderness to percussion Exts: Brisk capillary refill, warm and well perfused.  Rectal exam: Prostate is slightly enlarged mildly boggy but not particularly tender. No nodules palpated.   Results for orders placed or performed in visit on 12/31/16 (from the past 72 hour(s))  POCT Urinalysis Dipstick     Status: None   Collection Time: 12/31/16 11:48 AM  Result Value Ref Range   Color,  UA yellow    Clarity, UA clear    Glucose, UA neg    Bilirubin, UA neg    Ketones, UA neg    Spec Grav, UA 1.025 1.010 - 1.025   Blood, UA moderate    pH, UA 5.5 5.0 - 8.0   Protein, UA neg    Urobilinogen, UA 0.2 0.2 or 1.0 E.U./dL   Nitrite, UA neg    Leukocytes, UA Negative Negative  CBC with Differential/Platelet     Status: None   Collection Time: 12/31/16 11:52 AM  Result Value Ref Range   WBC 9.8 3.8 - 10.8 K/uL   RBC 4.99 4.20 - 5.80 MIL/uL   Hemoglobin 15.3 13.2 - 17.1 g/dL   HCT 45.1 38.5 - 50.0 %   MCV 90.4 80.0 -  100.0 fL   MCH 30.7 27.0 - 33.0 pg   MCHC 33.9 32.0 - 36.0 g/dL   RDW 13.7 11.0 - 15.0 %   Platelets 219 140 - 400 K/uL   MPV 9.3 7.5 - 12.5 fL   Neutro Abs 7,742 1,500 - 7,800 cells/uL   Lymphs Abs 1,078 850 - 3,900 cells/uL   Monocytes Absolute 882 200 - 950 cells/uL   Eosinophils Absolute 98 15 - 500 cells/uL   Basophils Absolute 0 0 - 200 cells/uL   Neutrophils Relative % 79 %   Lymphocytes Relative 11 %   Monocytes Relative 9 %   Eosinophils Relative 1 %   Basophils Relative 0 %   Smear Review Criteria for review not met   COMPLETE METABOLIC PANEL WITH GFR     Status: None (Preliminary result)   Collection Time: 12/31/16 11:52 AM  Result Value Ref Range   Sodium  135 - 146 mmol/L   Potassium  3.5 - 5.3 mmol/L   Chloride  98 - 110 mmol/L   CO2  20 - 31 mmol/L   Glucose, Bld  65 - 99 mg/dL   BUN  7 - 25 mg/dL   Creat  0.70 - 1.33 mg/dL   Total Bilirubin  0.2 - 1.2 mg/dL   Alkaline Phosphatase  40 - 115 U/L   AST  10 - 35 U/L   ALT  9 - 46 U/L   Total Protein  6.1 - 8.1 g/dL   Albumin  3.6 - 5.1 g/dL   Calcium  8.6 - 10.3 mg/dL   GFR, Est African American  >=60 mL/min   GFR, Est Non African American  >=60 mL/min  PSA     Status: None (Preliminary result)   Collection Time: 12/31/16 11:52 AM  Result Value Ref Range   PSA  ng/mL   Ct Renal Stone Study  Result Date: 12/31/2016 CLINICAL DATA:  56 year old male with left flank pain for 1 week. Associated nausea, diarrhea, microscopic hematuria. EXAM: CT ABDOMEN AND PELVIS WITHOUT CONTRAST TECHNIQUE: Multidetector CT imaging of the abdomen and pelvis was performed following the standard protocol without IV contrast. COMPARISON:  None. FINDINGS: Lower chest: Normal lung bases.  No pericardial or pleural effusion. Hepatobiliary: Negative noncontrast liver and gallbladder. Pancreas: Negative. Spleen: Negative. Adrenals/Urinary Tract: Normal adrenal glands. Possible subcentimeter low-density areas in the right renal cortex such as  due to cysts. Otherwise negative noncontrast right kidney. Negative course of the right ureter. Decompressed urinary bladder. 3-4 mm round calculus at the left ureterovesical junction or just inside the bladder (series 2, image 85 and coronal image 66). Left hydronephrosis, nephro megaly, perinephric stranding, left hydroureter and periureteral stranding. The ureter  remains abnormal to the ureterovesical junction. No intrarenal calculus identified. Evidence of a round midpole cyst with simple fluid density measuring about 16 mm years. Stomach/Bowel: Decompressed rectum. Moderate diverticulosis throughout the proximal sigmoid colon. Mild diverticulosis in the left colon. No associated active inflammation. Negative transverse colon aside from mild redundancy. Negative right colon, appendix and terminal ileum. No dilated small bowel. Negative stomach and duodenum. Vascular/Lymphatic: Vascular patency is not evaluated in the absence of IV contrast. Mild iliac artery calcified atherosclerosis. Reproductive: Small fat containing left inguinal hernia. Otherwise negative. Other:  No pelvic free fluid. Musculoskeletal: Widespread endplate spurring. Congenital spinal canal narrowing related short pedicles. No acute osseous abnormality identified. IMPRESSION: 1. Acute obstructive uropathy on the left with probable forniceal rupture. 3-4 mm calculus located at the left UVJ or just inside the bladder. 2. No other urologic calculus identified. Probable benign renal cysts. 3. Diverticulosis of the sigmoid colon. Electronically Signed   By: Genevie Ann M.D.   On: 12/31/2016 12:23      Assessment and Plan: 56 y.o. male with left lower quadrant abdominal pain. Likely obstructing urinary stone based on CT scan results. Flomax already ordered at time of discharge. Discussed with Dr Jeffie Pollock at [facility]. Will continue flomax and use norco for pain control. Plan to refer to Urology for appt in 7-10 days.   Orders Placed This  Encounter  Procedures  . Urine Culture  . CT RENAL STONE STUDY    Standing Status:   Future    Number of Occurrences:   1    Standing Expiration Date:   04/02/2018    Order Specific Question:   Reason for Exam (SYMPTOM  OR DIAGNOSIS REQUIRED)    Answer:   LLQ abd pain eval kidney stone vs diverticulitis    Order Specific Question:   Preferred imaging location?    Answer:   [facility]    Order Specific Question:   Radiology Contrast Protocol - do NOT remove file path    Answer:   [email or web address]  . CBC with Differential/Platelet  . COMPLETE METABOLIC PANEL WITH GFR  . PSA  . POCT Urinalysis Dipstick   Meds ordered this encounter  Medications  . tamsulosin (FLOMAX) 0.4 MG CAPS capsule    Sig: Take 1 capsule (0.4 mg total) by mouth daily after breakfast.    Dispense:  30 capsule    Refill:  1     Discussed warning signs or symptoms. Please see discharge instructions. Patient expresses understanding.  I spent 40 minutes with this patient, greater than 50% was face-to-face time counseling regarding the above diagnosis.

## 2016-12-31 NOTE — Telephone Encounter (Signed)
Called pt with results.  Plan for Norco and refer to urology.

## 2016-12-31 NOTE — Patient Instructions (Addendum)
Thank you for coming in today. We will figure out what is going on today.  We will do a CT scan and labs today.  I will contact you today with results and a plan.  If your belly pain worsens, or you have high fever, bad vomiting, blood in your stool or black tarry stool go to the Emergency Room.  Assuming it is diverticulitis start eating a clear liquid diet and take the antibiotics I will call in.  Recheck on Friday or so with me or Dr T.  Assuming this is a kidney stone please take the flomax and the stone will pass.    Kidney Stones Kidney stones (urolithiasis) are solid, rock-like deposits that form inside of the organs that make urine (kidneys). A kidney stone may form in a kidney and move into the bladder, where it can cause intense pain and block the flow of urine. Kidney stones are created when high levels of certain minerals are found in the urine. They are usually passed through urination, but in some cases, medical treatment may be needed to remove them. What are the causes? Kidney stones may be caused by:  A condition in which certain glands produce too much parathyroid hormone (primary hyperparathyroidism), which causes too much calcium buildup in the blood.  Buildup of uric acid crystals in the bladder (hyperuricosuria). Uric acid is a chemical that the body produces when you eat certain foods. It usually exits the body in the urine.  Narrowing (stricture) of one or both of the tubes that drain urine from the kidneys to the bladder (ureters).  A kidney blockage that is present at birth (congenital obstruction).  Past surgery on the kidney or the ureters, such as gastric bypass surgery.  What increases the risk? The following factors make you more likely to develop kidney stones:  Having had a kidney stone in the past.  Having a family history of kidney stones.  Not drinking enough water.  Eating a diet that is high in protein, salt (sodium), or sugar.  Being  overweight or obese.  What are the signs or symptoms? Symptoms of a kidney stone may include:  Nausea.  Vomiting.  Blood in the urine (hematuria).  Pain in the side of the abdomen, right below the ribs (flank pain). Pain usually spreads (radiates) to the groin.  Needing to urinate frequently or urgently.  How is this diagnosed? This condition may be diagnosed based on:  Your medical history.  A physical exam.  Blood tests.  Urine tests.  CT scan.  Abdominal X-ray.  A procedure to examine the inside of the bladder (cystoscopy).  How is this treated? Treatment for kidney stones depends on the size, location, and makeup of the stones. Treatment may involve:  Analyzing your urine before and after you pass the stone through urination.  Being monitored at the hospital until you pass the stone through urination.  Increasing your fluid intake and decreasing the amount of calcium and protein in your diet.  A procedure to break up kidney stones in the bladder using: ? A focused beam of light (laser therapy). ? Shock waves (extracorporeal shock wave lithotripsy).  Surgery to remove kidney stones. This may be needed if you have severe pain or have stones that block your urinary tract.  Follow these instructions at home: Eating and drinking   Drink enough fluid to keep your urine clear or pale yellow. This will help you to pass the kidney stone.  If directed, change your  diet. This may include: ? Limiting how much sodium you eat. ? Eating more fruits and vegetables. ? Limiting how much meat, poultry, fish, and eggs you eat.  Follow instructions from your health care provider about eating or drinking restrictions. General instructions  Collect urine samples as told by your health care provider. You may need to collect a urine sample: ? 24 hours after you pass the stone. ? 8-12 weeks after passing the kidney stone, and every 6-12 months after that.  Strain your urine  every time you urinate, for as long as directed. Use the strainer that your health care provider recommends.  Do not throw out the kidney stone after passing it. Keep the stone so it can be tested by your health care provider. Testing the makeup of your kidney stone may help prevent you from getting kidney stones in the future.  Take over-the-counter and prescription medicines only as told by your health care provider.  Keep all follow-up visits as told by your health care provider. This is important. You may need follow-up X-rays or ultrasounds to make sure that your stone has passed. How is this prevented? To prevent another kidney stone:  Drink enough fluid to keep your urine clear or pale yellow. This is the best way to prevent kidney stones.  Eat a healthy diet and follow recommendations from your health care provider about foods to avoid. You may be instructed to eat a low-protein diet. Recommendations vary depending on the type of kidney stone that you have.  Maintain a healthy weight.  Contact a health care provider if:  You have pain that gets worse or does not get better with medicine. Get help right away if:  You have a fever or chills.  You develop severe pain.  You develop new abdominal pain.  You faint.  You are unable to urinate. This information is not intended to replace advice given to you by your health care provider. Make sure you discuss any questions you have with your health care provider. Document Released: 07/02/2005 Document Revised: 01/20/2016 Document Reviewed: 12/16/2015 Elsevier Interactive Patient Education  2017 [employer]    Diverticulitis Diverticulitis is inflammation or infection of small pouches in your colon that form when you have a condition called diverticulosis. The pouches in your colon are called diverticula. Your colon, or large intestine, is where water is absorbed and stool is formed. Complications of diverticulitis can  include:  Bleeding.  Severe infection.  Severe pain.  Perforation of your colon.  Obstruction of your colon.  What are the causes? Diverticulitis is caused by bacteria. Diverticulitis happens when stool becomes trapped in diverticula. This allows bacteria to grow in the diverticula, which can lead to inflammation and infection. What increases the risk? People with diverticulosis are at risk for diverticulitis. Eating a diet that does not include enough fiber from fruits and vegetables may make diverticulitis more likely to develop. What are the signs or symptoms? Symptoms of diverticulitis may include:  Abdominal pain and tenderness. The pain is normally located on the left side of the abdomen, but may occur in other areas.  Fever and chills.  Bloating.  Cramping.  Nausea.  Vomiting.  Constipation.  Diarrhea.  Blood in your stool.  How is this diagnosed? Your health care provider will ask you about your medical history and do a physical exam. You may need to have tests done because many medical conditions can cause the same symptoms as diverticulitis. Tests may include:  Blood tests.  Urine tests.  Imaging tests of the abdomen, including X-rays and CT scans.  When your condition is under control, your health care provider may recommend that you have a colonoscopy. A colonoscopy can show how severe your diverticula are and whether something else is causing your symptoms. How is this treated? Most cases of diverticulitis are mild and can be treated at home. Treatment may include:  Taking over-the-counter pain medicines.  Following a clear liquid diet.  Taking antibiotic medicines by mouth for 7-10 days.  More severe cases may be treated at a hospital. Treatment may include:  Not eating or drinking.  Taking prescription pain medicine.  Receiving antibiotic medicines through an IV tube.  Receiving fluids and nutrition through an IV  tube.  Surgery.  Follow these instructions at home:  Follow your health care provider's instructions carefully.  Follow a full liquid diet or other diet as directed by your health care provider. After your symptoms improve, your health care provider may tell you to change your diet. He or she may recommend you eat a high-fiber diet. Fruits and vegetables are good sources of fiber. Fiber makes it easier to pass stool.  Take fiber supplements or probiotics as directed by your health care provider.  Only take medicines as directed by your health care provider.  Keep all your follow-up appointments. Contact a health care provider if:  Your pain does not improve.  You have a hard time eating food.  Your bowel movements do not return to normal. Get help right away if:  Your pain becomes worse.  Your symptoms do not get better.  Your symptoms suddenly get worse.  You have a fever.  You have repeated vomiting.  You have bloody or black, tarry stools. This information is not intended to replace advice given to you by your health care provider. Make sure you discuss any questions you have with your health care provider. Document Released: 04/11/2005 Document Revised: 12/08/2015 Document Reviewed: 05/27/2013 Elsevier Interactive Patient Education  2017 [employer].

## 2017-01-01 LAB — COMPLETE METABOLIC PANEL WITH GFR
ALBUMIN: 4.4 g/dL (ref 3.6–5.1)
ALT: 26 U/L (ref 9–46)
AST: 19 U/L (ref 10–35)
Alkaline Phosphatase: 46 U/L (ref 40–115)
BUN: 27 mg/dL — AB (ref 7–25)
CHLORIDE: 107 mmol/L (ref 98–110)
CO2: 25 mmol/L (ref 20–31)
Calcium: 9.5 mg/dL (ref 8.6–10.3)
Creat: 1.53 mg/dL — ABNORMAL HIGH (ref 0.70–1.33)
GFR, Est African American: 58 mL/min — ABNORMAL LOW (ref >=60)
GFR, Est Non African American: 50 mL/min — ABNORMAL LOW (ref >=60)
GLUCOSE: 88 mg/dL (ref 65–99)
POTASSIUM: 4.5 mmol/L (ref 3.5–5.3)
SODIUM: 140 mmol/L (ref 135–146)
Total Bilirubin: 0.5 mg/dL (ref 0.2–1.2)
Total Protein: 6.3 g/dL (ref 6.1–8.1)

## 2017-01-01 LAB — URINE CULTURE

## 2017-01-01 LAB — PSA: PSA: 0.5 ng/mL (ref <=4.0)

## 2017-01-02 ENCOUNTER — Encounter: Payer: Self-pay | Admitting: Sports Medicine

## 2017-01-02 MED ORDER — MAGNESIUM OXIDE 400 (241.3 MG) MG PO TABS: ORAL_TABLET | ORAL | 1 refills | Status: DC

## 2017-01-18 ENCOUNTER — Ambulatory Visit: Payer: BLUE CROSS/BLUE SHIELD | Admitting: Sports Medicine

## 2017-01-28 ENCOUNTER — Encounter: Payer: Self-pay | Admitting: Sports Medicine

## 2017-01-28 ENCOUNTER — Other Ambulatory Visit: Payer: Self-pay

## 2017-01-28 DIAGNOSIS — E6609 Other obesity due to excess calories: Secondary | ICD-10-CM

## 2017-01-28 MED ORDER — PHENTERMINE-TOPIRAMATE ER 15-92 MG PO CP24: 1.0000 | ORAL_CAPSULE | Freq: Every morning | ORAL | 0 refills | Status: DC

## 2017-05-08 ENCOUNTER — Telehealth: Payer: Self-pay | Admitting: Sports Medicine

## 2017-05-08 DIAGNOSIS — Z0000 Encounter for general adult medical examination without abnormal findings: Secondary | ICD-10-CM

## 2017-05-08 NOTE — Telephone Encounter (Signed)
Okay thank you

## 2017-05-08 NOTE — Telephone Encounter (Signed)
Patient called scheduled a physical for 05/10/17 at 10am and would like to get lab order sent down so he can go Friday morning. Thanks

## 2017-05-08 NOTE — Telephone Encounter (Signed)
Labs ordered.

## 2017-05-10 ENCOUNTER — Encounter: Payer: Self-pay | Admitting: Sports Medicine

## 2017-05-10 ENCOUNTER — Ambulatory Visit (INDEPENDENT_AMBULATORY_CARE_PROVIDER_SITE_OTHER): Payer: BLUE CROSS/BLUE SHIELD | Admitting: Sports Medicine

## 2017-05-10 VITALS — BP 136/80 | HR 91 | Ht 69.02 in | Wt 251.0 lb

## 2017-05-10 DIAGNOSIS — E6609 Other obesity due to excess calories: Secondary | ICD-10-CM | POA: Diagnosis not present

## 2017-05-10 DIAGNOSIS — Z0000 Encounter for general adult medical examination without abnormal findings: Secondary | ICD-10-CM

## 2017-05-10 DIAGNOSIS — M5416 Radiculopathy, lumbar region: Secondary | ICD-10-CM

## 2017-05-10 DIAGNOSIS — E783 Hyperchylomicronemia: Secondary | ICD-10-CM

## 2017-05-10 DIAGNOSIS — N139 Obstructive and reflux uropathy, unspecified: Secondary | ICD-10-CM | POA: Insufficient documentation

## 2017-05-10 MED ORDER — CIPROFLOXACIN HCL 750 MG PO TABS: 750.0000 mg | ORAL_TABLET | Freq: Two times a day (BID) | ORAL | 0 refills | Status: AC

## 2017-05-10 MED ORDER — TOPIRAMATE 50 MG PO TABS: ORAL_TABLET | ORAL | 0 refills | Status: DC

## 2017-05-10 MED ORDER — PHENTERMINE HCL 15 MG PO CAPS: 15.0000 mg | ORAL_CAPSULE | ORAL | 0 refills | Status: DC

## 2017-05-10 NOTE — Assessment & Plan Note (Addendum)
Having a recurrence of low back pain, he has responded well with bilateral L4-L5 transforaminal epidurals. Unfortunately his MRI is over 57 years old, I am going to order a new MRI and set him up here in [place] for repeat bilateral L4-L5 transforaminal epidural once he gets his new MRI. He would also like a second opinion from neurosurgeon, referral placed to [place] [facility].

## 2017-05-10 NOTE — Progress Notes (Addendum)
Subjective:    CC: Annual physical and multiple issues  HPI:  This is a pleasant 56 year old male here for his physical, he has several questions.  Obesity: Had difficulty tolerating Qsymia, seemingly adverse effects found to be from the Topamax.  Lumbar degenerative disc disease: L4-L5, has done well in the past with L4-L5 transforaminal epidurals, the most recent of which was in February 2017.  He is taking gabapentin 600 mg 3 times per day, Tylenol, and meloxicam.  Having recurrence of pain, axial with radiation into the buttocks, nothing overtly radicular today, he is agreeable to proceed with another epidural but he would also like to discuss his situation with neurosurgery.  MRI was 5 years ago so he understands he may need a new one.  No bowel or bladder dysfunction, saddle numbness, constitutional symptoms.  With weak urinary stream: Had nephrolithiasis, after this he noted increasing difficulty with weak stream, dribbling, only a few episodes of nocturia.  No constitutional symptoms.  He does endorse some fullness and pain in the perineum.  Past medical history:  Negative.  See flowsheet/record as well for more information.  Surgical history: Negative.  See flowsheet/record as well for more information.  Family history: Negative.  See flowsheet/record as well for more information.  Social history: Negative.  See flowsheet/record as well for more information.  Allergies, and medications have been entered into the medical record, reviewed, and no changes needed.    Review of Systems: No headache, visual changes, nausea, vomiting, diarrhea, constipation, dizziness, abdominal pain, skin rash, fevers, chills, night sweats, swollen lymph nodes, weight loss, chest pain, body aches, joint swelling, muscle aches, shortness of breath, mood changes, visual or auditory hallucinations.  Objective:    General: Well Developed, well nourished, and in no acute distress.  Neuro: Alert and oriented  x3, extra-ocular muscles intact, sensation grossly intact. Cranial nerves II through XII are intact, motor, sensory, and coordinative functions are all intact. HEENT: Normocephalic, atraumatic, pupils equal round reactive to light, neck supple, no masses, no lymphadenopathy, thyroid nonpalpable. Oropharynx, nasopharynx, external ear canals are unremarkable. Skin: Warm and dry, no rashes noted.  Cardiac: Regular rate and rhythm, no murmurs rubs or gallops.  Respiratory: Clear to auscultation bilaterally. Not using accessory muscles, speaking in full sentences.  Abdominal: Soft, nontender, nondistended, positive bowel sounds, no masses, no organomegaly.  Musculoskeletal: Shoulder, elbow, wrist, hip, knee, ankle stable, and with full range of motion.  Impression and Recommendations:    The patient was counselled, risk factors were discussed, anticipatory guidance given.  Annual physical exam Annual physical as above, blood work was done this morning.  Lumbar radiculopathy Having a recurrence of low back pain, he has responded well with bilateral L4-L5 transforaminal epidurals. Unfortunately his MRI is over 110 years old, I am going to order a new MRI and set him up here in [place] for repeat bilateral L4-L5 transforaminal epidural once he gets his new MRI. He would also like a second opinion from neurosurgeon, referral placed to [place] [facility].  Obstructive uropathy Suspect prostatitis. 28 days of ciprofloxacin, adding a urinalysis and urine culture.  Obesity Was unable to tolerate the topiramate portion of Qsymia. Refilling phentermine at 15 mg, adding low-dose topiramate to take at bedtime. He can return in 3 months for this.  Hyperlipidemia Triglycerides are elevated, he is not taking Vascepa, continues with fenofibrate, switching to Lovaza from Vascepa.  Recheck in 3 months.   I spent 40 minutes with this patient, greater than 50% was face-to-face time counseling  regarding  the above diagnoses, this was separate from the time spent performing the physical examination. ___________________________________________ [name]. Dianah Field, M.D., ABFM., CAQSM. Primary Care and [place] Instructor of [employer] [facility]

## 2017-05-10 NOTE — Assessment & Plan Note (Signed)
Was unable to tolerate the topiramate portion of Qsymia. Refilling phentermine at 15 mg, adding low-dose topiramate to take at bedtime. He can return in 3 months for this.

## 2017-05-10 NOTE — Assessment & Plan Note (Signed)
Suspect prostatitis. 28 days of ciprofloxacin, adding a urinalysis and urine culture.

## 2017-05-10 NOTE — Assessment & Plan Note (Signed)
Annual physical as above, blood work was done this morning.

## 2017-05-11 LAB — COMPREHENSIVE METABOLIC PANEL
ALT: 23 U/L (ref 9–46)
Albumin: 4.4 g/dL (ref 3.6–5.1)
Alkaline phosphatase (APISO): 47 U/L (ref 40–115)
BUN: 22 mg/dL (ref 7–25)
Calcium: 9 mg/dL (ref 8.6–10.3)
Creat: 1.18 mg/dL (ref 0.70–1.33)
Total Bilirubin: 0.4 mg/dL (ref 0.2–1.2)
Total Protein: 6.3 g/dL (ref 6.1–8.1)

## 2017-05-11 LAB — URINALYSIS
Bilirubin Urine: NEGATIVE
Glucose, UA: NEGATIVE
Hgb urine dipstick: NEGATIVE
Ketones, ur: NEGATIVE
Leukocytes, UA: NEGATIVE
Nitrite: NEGATIVE
Protein, ur: NEGATIVE
Specific Gravity, Urine: 1.02 (ref 1.001–1.03)
pH: 6.5 (ref 5.0–8.0)

## 2017-05-11 LAB — CBC
HCT: 44.4 % (ref 38.5–50.0)
Hemoglobin: 15.4 g/dL (ref 13.2–17.1)
MCH: 30.8 pg (ref 27.0–33.0)
MCHC: 34.7 g/dL (ref 32.0–36.0)
MCV: 88.8 fL (ref 80.0–100.0)
MPV: 10.8 fL (ref 7.5–12.5)
Platelets: 253 10*3/uL (ref 140–400)
RBC: 5 10*6/uL (ref 4.20–5.80)
RDW: 12 % (ref 11.0–15.0)
WBC: 4.2 Thousand/uL (ref 3.8–10.8)

## 2017-05-11 LAB — COMPREHENSIVE METABOLIC PANEL WITH GFR
AG Ratio: 2.3 (calc) (ref 1.0–2.5)
AST: 18 U/L (ref 10–35)
CO2: 24 mmol/L (ref 20–32)
Chloride: 105 mmol/L (ref 98–110)
Globulin: 1.9 g/dL (ref 1.9–3.7)
Glucose, Bld: 93 mg/dL (ref 65–99)
Potassium: 4.3 mmol/L (ref 3.5–5.3)
Sodium: 139 mmol/L (ref 135–146)

## 2017-05-11 LAB — LIPID PANEL W/REFLEX DIRECT LDL
Cholesterol: 202 mg/dL — ABNORMAL HIGH (ref <200)
HDL: 41 mg/dL (ref >40)
LDL Cholesterol (Calc): 121 mg/dL (calc) — ABNORMAL HIGH
Non-HDL Cholesterol (Calc): 161 mg/dL — ABNORMAL HIGH (ref <130)
Total CHOL/HDL Ratio: 4.9 (calc) (ref <5.0)
Triglycerides: 262 mg/dL — ABNORMAL HIGH (ref <150)

## 2017-05-11 LAB — URINE CULTURE
MICRO NUMBER:: 81203858
Result:: NO GROWTH
SPECIMEN QUALITY:: ADEQUATE

## 2017-05-11 LAB — HEMOGLOBIN A1C
Hgb A1c MFr Bld: 5.2 %{Hb} (ref <5.7)
Mean Plasma Glucose: 103 (calc)
eAG (mmol/L): 5.7 (calc)

## 2017-05-11 LAB — VITAMIN D 25 HYDROXY (VIT D DEFICIENCY, FRACTURES): Vit D, 25-Hydroxy: 29 ng/mL — ABNORMAL LOW (ref 30–100)

## 2017-05-11 LAB — TSH: TSH: 2.05 m[IU]/L (ref 0.40–4.50)

## 2017-05-13 MED ORDER — VITAMIN D (ERGOCALCIFEROL) 1.25 MG (50000 UNIT) PO CAPS: 50000.0000 [IU] | ORAL_CAPSULE | ORAL | 0 refills | Status: DC

## 2017-05-13 NOTE — Addendum Note (Signed)
Addended by: Silverio Decamp on: 05/13/2017 08:44 AM   Modules accepted: Orders

## 2017-05-14 MED ORDER — OMEGA-3-ACID ETHYL ESTERS 1 G PO CAPS: 2.0000 g | ORAL_CAPSULE | Freq: Two times a day (BID) | ORAL | 3 refills | Status: DC

## 2017-05-14 NOTE — Addendum Note (Signed)
Addended by: Silverio Decamp on: 05/14/2017 09:14 AM   Modules accepted: Orders

## 2017-05-14 NOTE — Assessment & Plan Note (Signed)
Triglycerides are elevated, he is not taking Vascepa, continues with fenofibrate, switching to Lovaza from Vascepa.  Recheck in 3 months.

## 2017-05-23 ENCOUNTER — Encounter: Payer: Self-pay | Admitting: Sports Medicine

## 2017-05-27 ENCOUNTER — Ambulatory Visit (INDEPENDENT_AMBULATORY_CARE_PROVIDER_SITE_OTHER): Payer: BLUE CROSS/BLUE SHIELD

## 2017-05-27 ENCOUNTER — Encounter: Payer: Self-pay | Admitting: Sports Medicine

## 2017-05-27 DIAGNOSIS — M48061 Spinal stenosis, lumbar region without neurogenic claudication: Secondary | ICD-10-CM

## 2017-05-27 DIAGNOSIS — M5416 Radiculopathy, lumbar region: Secondary | ICD-10-CM

## 2017-06-03 DIAGNOSIS — M5136 Other intervertebral disc degeneration, lumbar region: Secondary | ICD-10-CM | POA: Insufficient documentation

## 2017-06-03 DIAGNOSIS — M51369 Other intervertebral disc degeneration, lumbar region without mention of lumbar back pain or lower extremity pain: Secondary | ICD-10-CM | POA: Insufficient documentation

## 2017-06-03 DIAGNOSIS — M47816 Spondylosis without myelopathy or radiculopathy, lumbar region: Secondary | ICD-10-CM | POA: Diagnosis not present

## 2017-06-03 DIAGNOSIS — M9983 Other biomechanical lesions of lumbar region: Secondary | ICD-10-CM | POA: Diagnosis not present

## 2017-06-03 DIAGNOSIS — M545 Low back pain: Secondary | ICD-10-CM | POA: Diagnosis not present

## 2017-06-09 ENCOUNTER — Other Ambulatory Visit: Payer: Self-pay | Admitting: Sports Medicine

## 2017-06-09 DIAGNOSIS — E6609 Other obesity due to excess calories: Secondary | ICD-10-CM

## 2017-06-12 ENCOUNTER — Encounter: Payer: Self-pay | Admitting: Sports Medicine

## 2017-06-12 ENCOUNTER — Ambulatory Visit (INDEPENDENT_AMBULATORY_CARE_PROVIDER_SITE_OTHER): Payer: BLUE CROSS/BLUE SHIELD | Admitting: Sports Medicine

## 2017-06-12 DIAGNOSIS — E6609 Other obesity due to excess calories: Secondary | ICD-10-CM | POA: Diagnosis not present

## 2017-06-12 DIAGNOSIS — M5416 Radiculopathy, lumbar region: Secondary | ICD-10-CM | POA: Diagnosis not present

## 2017-06-12 DIAGNOSIS — Z23 Encounter for immunization: Secondary | ICD-10-CM

## 2017-06-12 DIAGNOSIS — N139 Obstructive and reflux uropathy, unspecified: Secondary | ICD-10-CM

## 2017-06-12 MED ORDER — TAMSULOSIN HCL 0.4 MG PO CAPS: 0.4000 mg | ORAL_CAPSULE | Freq: Two times a day (BID) | ORAL | 3 refills | Status: DC

## 2017-06-12 MED ORDER — TRAMADOL HCL 50 MG PO TABS: ORAL_TABLET | ORAL | 0 refills | Status: DC

## 2017-06-12 NOTE — Assessment & Plan Note (Signed)
Responded well to bilateral L4-L5 transforaminal epidurals, new MRI was obtained and he is now scheduled for bilateral epidurals. Has not yet been contacted regarding his referral to [place] [facility]. He does go out of town for a business trip, I am going to add some tramadol.

## 2017-06-12 NOTE — Progress Notes (Signed)
  Subjective:    CC: Obstructive uropathy  HPI: Obstructive uropathy: Improved considerably with 28 days of ciprofloxacin, he is only on 0.4 mg of Flomax and still has occasional episodes of weak stream.  Not much nocturia.  Lumbar degenerative disc disease: Scheduled for his injections.  Obesity: Doing well with low-dose phentermine.  Past medical history:  Negative.  See flowsheet/record as well for more information.  Surgical history: Negative.  See flowsheet/record as well for more information.  Family history: Negative.  See flowsheet/record as well for more information.  Social history: Negative.  See flowsheet/record as well for more information.  Allergies, and medications have been entered into the medical record, reviewed, and no changes needed.   Review of Systems: No fevers, chills, night sweats, weight loss, chest pain, or shortness of breath.   Objective:    General: Well Developed, well nourished, and in no acute distress.  Neuro: Alert and oriented x3, extra-ocular muscles intact, sensation grossly intact.  HEENT: Normocephalic, atraumatic, pupils equal round reactive to light, neck supple, no masses, no lymphadenopathy, thyroid nonpalpable.  Skin: Warm and dry, no rashes. Cardiac: Regular rate and rhythm, no murmurs rubs or gallops, no lower extremity edema.  Respiratory: Clear to auscultation bilaterally. Not using accessory muscles, speaking in full sentences.  Impression and Recommendations:    Obesity Continue with low-dose phentermine.  Continue with low-dose Topamax at bedtime, we may increase to a higher dose at the follow-up visit.  Obstructive uropathy Good improvements with 28 days of ciprofloxacin. Increasing Flomax to 0.8 mg.  Lumbar radiculopathy Responded well to bilateral L4-L5 transforaminal epidurals, new MRI was obtained and he is now scheduled for bilateral epidurals. Has not yet been contacted regarding his referral to [place]  neurosurgery. He does go out of town for a business trip, I am going to add some tramadol.  I spent 25 minutes with this patient, greater than 50% was face-to-face time counseling regarding the above diagnoses ___________________________________________ [name]. Dianah Field, M.D., ABFM., CAQSM. Primary Care and [place] Instructor of [place] of [facility] of Medicine

## 2017-06-12 NOTE — Assessment & Plan Note (Signed)
Continue with low-dose phentermine.  Continue with low-dose Topamax at bedtime, we may increase to a higher dose at the follow-up visit.

## 2017-06-12 NOTE — Assessment & Plan Note (Signed)
Good improvements with 28 days of ciprofloxacin. Increasing Flomax to 0.8 mg.

## 2017-06-18 ENCOUNTER — Encounter: Payer: Self-pay | Admitting: Sports Medicine

## 2017-06-18 DIAGNOSIS — M5416 Radiculopathy, lumbar region: Secondary | ICD-10-CM

## 2017-06-18 MED ORDER — GABAPENTIN 600 MG PO TABS: 1200.0000 mg | ORAL_TABLET | Freq: Two times a day (BID) | ORAL | 3 refills | Status: DC

## 2017-06-27 ENCOUNTER — Encounter: Payer: Self-pay | Admitting: Sports Medicine

## 2017-06-27 DIAGNOSIS — R109 Unspecified abdominal pain: Secondary | ICD-10-CM | POA: Diagnosis not present

## 2017-06-27 DIAGNOSIS — Z880 Allergy status to penicillin: Secondary | ICD-10-CM | POA: Diagnosis not present

## 2017-06-27 DIAGNOSIS — R110 Nausea: Secondary | ICD-10-CM | POA: Diagnosis not present

## 2017-06-27 DIAGNOSIS — R197 Diarrhea, unspecified: Secondary | ICD-10-CM | POA: Diagnosis not present

## 2017-06-27 DIAGNOSIS — K529 Noninfective gastroenteritis and colitis, unspecified: Secondary | ICD-10-CM | POA: Diagnosis not present

## 2017-07-04 DIAGNOSIS — M5136 Other intervertebral disc degeneration, lumbar region: Secondary | ICD-10-CM | POA: Diagnosis not present

## 2017-07-05 ENCOUNTER — Other Ambulatory Visit: Payer: Self-pay

## 2017-07-05 DIAGNOSIS — E6609 Other obesity due to excess calories: Secondary | ICD-10-CM

## 2017-07-05 MED ORDER — TOPIRAMATE 50 MG PO TABS: ORAL_TABLET | ORAL | 1 refills | Status: DC

## 2017-07-30 DIAGNOSIS — Z713 Dietary counseling and surveillance: Secondary | ICD-10-CM | POA: Diagnosis not present

## 2017-08-09 ENCOUNTER — Encounter: Payer: Self-pay | Admitting: Sports Medicine

## 2017-08-09 MED ORDER — AZITHROMYCIN 250 MG PO TABS: ORAL_TABLET | ORAL | 0 refills | Status: DC

## 2017-08-16 ENCOUNTER — Ambulatory Visit (INDEPENDENT_AMBULATORY_CARE_PROVIDER_SITE_OTHER): Payer: BLUE CROSS/BLUE SHIELD | Admitting: Sports Medicine

## 2017-08-16 ENCOUNTER — Encounter: Payer: Self-pay | Admitting: Sports Medicine

## 2017-08-16 DIAGNOSIS — M545 Low back pain: Secondary | ICD-10-CM | POA: Diagnosis not present

## 2017-08-16 DIAGNOSIS — N139 Obstructive and reflux uropathy, unspecified: Secondary | ICD-10-CM

## 2017-08-16 DIAGNOSIS — E6609 Other obesity due to excess calories: Secondary | ICD-10-CM | POA: Diagnosis not present

## 2017-08-16 DIAGNOSIS — E291 Testicular hypofunction: Secondary | ICD-10-CM | POA: Diagnosis not present

## 2017-08-16 DIAGNOSIS — N529 Male erectile dysfunction, unspecified: Secondary | ICD-10-CM

## 2017-08-16 DIAGNOSIS — M5136 Other intervertebral disc degeneration, lumbar region: Secondary | ICD-10-CM | POA: Diagnosis not present

## 2017-08-16 DIAGNOSIS — M9983 Other biomechanical lesions of lumbar region: Secondary | ICD-10-CM | POA: Diagnosis not present

## 2017-08-16 DIAGNOSIS — M47816 Spondylosis without myelopathy or radiculopathy, lumbar region: Secondary | ICD-10-CM | POA: Diagnosis not present

## 2017-08-16 MED ORDER — SILDENAFIL CITRATE 20 MG PO TABS
20.0000 mg | ORAL_TABLET | ORAL | 11 refills | Status: DC | PRN
Start: 2017-08-16 — End: 2019-04-24

## 2017-08-16 MED ORDER — DUTASTERIDE 0.5 MG PO CAPS: 0.5000 mg | ORAL_CAPSULE | Freq: Every day | ORAL | 11 refills | Status: DC

## 2017-08-16 MED ORDER — PHENTERMINE HCL 15 MG PO CAPS: 15.0000 mg | ORAL_CAPSULE | ORAL | 0 refills | Status: DC

## 2017-08-16 MED ORDER — TAMSULOSIN HCL 0.4 MG PO CAPS: 0.4000 mg | ORAL_CAPSULE | Freq: Two times a day (BID) | ORAL | 3 refills | Status: DC

## 2017-08-16 MED ORDER — TOPIRAMATE 50 MG PO TABS: ORAL_TABLET | ORAL | 1 refills | Status: DC

## 2017-08-16 NOTE — Assessment & Plan Note (Signed)
Good continued weight loss, refilling low-dose phentermine

## 2017-08-16 NOTE — Progress Notes (Signed)
Subjective:    CC: Multiple issues  HPI: Obesity: Needs a refill on phentermine, but he ran out for a couple of days but still lost 6 pounds.  Obstructive uropathy: Much improved with treatment of his chronic prostatitis with a month of Cipro.  He did a lot better with the increase to 0.8 mg of Flomax but still has some hesitancy, frequency, weak stream and dribbling.  Erectile dysfunction: He has noted somewhat of a lower sex drive, on further questioning this is more because of a fear of inability to perform, he did have some painful ejaculations when he had prostatitis, has not really tried much since, I did call in some generic Viagra at the last visit but he has not filled it yet.  I reviewed the past medical history, family history, social history, surgical history, and allergies today and no changes were needed.  Please see the problem list section below in epic for further details.  Past Medical History: No past medical history on file. Past Surgical History: No past surgical history on file. Social History: Social History   Socioeconomic History  . Marital status: Married    Spouse name: None  . Number of children: None  . Years of education: None  . Highest education level: None  Social Needs  . Financial resource strain: None  . Food insecurity - worry: None  . Food insecurity - inability: None  . Transportation needs - medical: None  . Transportation needs - non-medical: None  Occupational History  . None  Tobacco Use  . Smoking status: Former Smoker    Last attempt to quit: 07/17/1995    Years since quitting: 22.0  . Smokeless tobacco: Never Used  Substance and Sexual Activity  . Alcohol use: No  . Drug use: No  . Sexual activity: Yes    Birth control/protection: None  Other Topics Concern  . None  Social History Narrative  . None   Family History: Family History  Problem Relation Age of Onset  . Cancer Mother   . Cancer Father   . Heart disease  Sister   . Diabetes Sister   . Hyperlipidemia Sister   . Hypertension Sister   . Heart disease Brother   . Diabetes Brother   . Hyperlipidemia Brother   . Hypertension Brother    Allergies: Allergies  Allergen Reactions  . Amoxicillin Nausea And Vomiting  . Statins    Medications: See med rec.  Review of Systems: No fevers, chills, night sweats, weight loss, chest pain, or shortness of breath.   Objective:    General: Well Developed, well nourished, and in no acute distress.  Neuro: Alert and oriented x3, extra-ocular muscles intact, sensation grossly intact.  HEENT: Normocephalic, atraumatic, pupils equal round reactive to light, neck supple, no masses, no lymphadenopathy, thyroid nonpalpable.  Skin: Warm and dry, no rashes. Cardiac: Regular rate and rhythm, no murmurs rubs or gallops, no lower extremity edema.  Respiratory: Clear to auscultation bilaterally. Not using accessory muscles, speaking in full sentences.  Impression and Recommendations:    Erectile dysfunction Refilling generic Viagra. He does have some decreased sex drive but on further questioning it due to fear of not being able to perform. He had some pain with ejaculation during prostatitis, I think if we get enough Viagra in his system and give him confidence in his erections he will perform better.    Hypogonadism male Refilling generic Viagra. He does have some decreased sex drive but on further questioning it  due to fear of not being able to perform. He had some pain with ejaculation during prostatitis, I think if we get enough Viagra in his system and give him confidence in his erections he will perform better.  Obesity Good continued weight loss, refilling low-dose phentermine  Obstructive uropathy Prostatitis has improved after 28 days of ciprofloxacin, urinary stream has also improved with the increase of Flomax to 0.8 mg. Adding Avodart as a 5 alpha reductase inhibitor, I did explain to the  patient that this could take 6-9 months for symptom improvement. ___________________________________________ [name]. Dianah Field, M.D., ABFM., CAQSM. Primary Care and [place] Instructor of [place] of [facility] of Medicine

## 2017-08-16 NOTE — Assessment & Plan Note (Signed)
Prostatitis has improved after 28 days of ciprofloxacin, urinary stream has also improved with the increase of Flomax to 0.8 mg. Adding Avodart as a 5 alpha reductase inhibitor, I did explain to the patient that this could take 6-9 months for symptom improvement.

## 2017-08-16 NOTE — Assessment & Plan Note (Signed)
Refilling generic Viagra. He does have some decreased sex drive but on further questioning it due to fear of not being able to perform. He had some pain with ejaculation during prostatitis, I think if we get enough Viagra in his system and give him confidence in his erections he will perform better.

## 2017-09-26 DIAGNOSIS — M545 Low back pain: Secondary | ICD-10-CM | POA: Diagnosis not present

## 2017-09-26 DIAGNOSIS — M47816 Spondylosis without myelopathy or radiculopathy, lumbar region: Secondary | ICD-10-CM | POA: Diagnosis not present

## 2017-10-01 ENCOUNTER — Other Ambulatory Visit: Payer: Self-pay | Admitting: Sports Medicine

## 2017-10-05 ENCOUNTER — Other Ambulatory Visit: Payer: Self-pay | Admitting: Sports Medicine

## 2017-10-05 DIAGNOSIS — M5416 Radiculopathy, lumbar region: Secondary | ICD-10-CM

## 2017-10-08 DIAGNOSIS — Z713 Dietary counseling and surveillance: Secondary | ICD-10-CM | POA: Diagnosis not present

## 2017-10-14 ENCOUNTER — Encounter: Payer: Self-pay | Admitting: Sports Medicine

## 2017-10-22 ENCOUNTER — Other Ambulatory Visit: Payer: Self-pay

## 2017-10-28 DIAGNOSIS — M47816 Spondylosis without myelopathy or radiculopathy, lumbar region: Secondary | ICD-10-CM | POA: Diagnosis not present

## 2017-11-15 ENCOUNTER — Encounter: Payer: Self-pay | Admitting: Sports Medicine

## 2017-11-15 ENCOUNTER — Ambulatory Visit (INDEPENDENT_AMBULATORY_CARE_PROVIDER_SITE_OTHER): Payer: BLUE CROSS/BLUE SHIELD | Admitting: Sports Medicine

## 2017-11-15 VITALS — BP 133/73 | HR 93 | Resp 18 | Wt 246.0 lb

## 2017-11-15 DIAGNOSIS — E783 Hyperchylomicronemia: Secondary | ICD-10-CM

## 2017-11-15 DIAGNOSIS — N529 Male erectile dysfunction, unspecified: Secondary | ICD-10-CM | POA: Diagnosis not present

## 2017-11-15 DIAGNOSIS — M1712 Unilateral primary osteoarthritis, left knee: Secondary | ICD-10-CM | POA: Diagnosis not present

## 2017-11-15 DIAGNOSIS — N139 Obstructive and reflux uropathy, unspecified: Secondary | ICD-10-CM

## 2017-11-15 DIAGNOSIS — Z23 Encounter for immunization: Secondary | ICD-10-CM | POA: Diagnosis not present

## 2017-11-15 DIAGNOSIS — R2242 Localized swelling, mass and lump, left lower limb: Secondary | ICD-10-CM

## 2017-11-15 MED ORDER — CELECOXIB 200 MG PO CAPS: ORAL_CAPSULE | ORAL | 2 refills | Status: DC

## 2017-11-15 NOTE — Assessment & Plan Note (Signed)
Continue Lovaza and fenofibrate. Rechecking lipids, we will add Livalo if lipids are still elevated. He was intolerant of niacin, vaccine but was too expensive. He has had excessive myalgias with atorvastatin, simvastatin, rosuvastatin.

## 2017-11-15 NOTE — Assessment & Plan Note (Signed)
Overall doing well on Flomax, should be taking Avodart as well.

## 2017-11-15 NOTE — Assessment & Plan Note (Signed)
Good response to 20 mg of sildenafil. He is going to taper upward.

## 2017-11-15 NOTE — Progress Notes (Signed)
Subjective:    CC: Follow-up  HPI: Multiple issues.  Erectile dysfunction: Slightly better but only using 20 mg of generic Viagra.  Hypertriglyceridemia: Currently on Lovaza and fenofibrate, agreeable to recheck his lipids.  Left knee pain: Known osteoarthritis, has been on meloxicam for a long time, previous injection was in December 2017.  Skin mass: Left-sided on the knee, posterior/lateral corner, tender.  Relatively new and growing.  Obstructive uropathy: Improved with full dose Flomax and he has started Avodart.  Understands it will take 6 to 9 months to really feel any change.  I reviewed the past medical history, family history, social history, surgical history, and allergies today and no changes were needed.  Please see the problem list section below in epic for further details.  Past Medical History: No past medical history on file. Past Surgical History: No past surgical history on file. Social History: Social History   Socioeconomic History  . Marital status: Married    Spouse name: Not on file  . Number of children: Not on file  . Years of education: Not on file  . Highest education level: Not on file  Occupational History  . Not on file  Social Needs  . Financial resource strain: Not on file  . Food insecurity:    Worry: Not on file    Inability: Not on file  . Transportation needs:    Medical: Not on file    Non-medical: Not on file  Tobacco Use  . Smoking status: Former Smoker    Last attempt to quit: 07/17/1995    Years since quitting: 22.3  . Smokeless tobacco: Never Used  Substance and Sexual Activity  . Alcohol use: No  . Drug use: No  . Sexual activity: Yes    Birth control/protection: None  Lifestyle  . Physical activity:    Days per week: Not on file    Minutes per session: Not on file  . Stress: Not on file  Relationships  . Social connections:    Talks on phone: Not on file    Gets together: Not on file    Attends religious  service: Not on file    Active member of club or organization: Not on file    Attends meetings of clubs or organizations: Not on file    Relationship status: Not on file  Other Topics Concern  . Not on file  Social History Narrative  . Not on file   Family History: Family History  Problem Relation Age of Onset  . Cancer Mother   . Cancer Father   . Heart disease Sister   . Diabetes Sister   . Hyperlipidemia Sister   . Hypertension Sister   . Heart disease Brother   . Diabetes Brother   . Hyperlipidemia Brother   . Hypertension Brother    Allergies: Allergies  Allergen Reactions  . Amoxicillin Nausea And Vomiting  . Statins    Medications: See med rec.  Review of Systems: No fevers, chills, night sweats, weight loss, chest pain, or shortness of breath.   Objective:    General: Well Developed, well nourished, and in no acute distress.  Neuro: Alert and oriented x3, extra-ocular muscles intact, sensation grossly intact.  HEENT: Normocephalic, atraumatic, pupils equal round reactive to light, neck supple, no masses, no lymphadenopathy, thyroid nonpalpable.  Skin: Warm and dry, no rashes. Cardiac: Regular rate and rhythm, no murmurs rubs or gallops, no lower extremity edema.  Respiratory: Clear to auscultation bilaterally. Not using accessory muscles,  speaking in full sentences. Left knee: Minimal tenderness at the medial joint line, he also has a palpable mass, 2 to 3 cm in the posterior/lateral corner of his knee that feels like a subcutaneous lipoma, it is tender.  No overlying skin changes. ROM normal in flexion and extension and lower leg rotation. Ligaments with solid consistent endpoints including ACL, PCL, LCL, MCL. Negative Mcmurray's and provocative meniscal tests. Non painful patellar compression. Patellar and quadriceps tendons unremarkable. Hamstring and quadriceps strength is normal.  Impression and Recommendations:    Erectile dysfunction Good response  to 20 mg of sildenafil. He is going to taper upward.  Hyperlipidemia Continue Lovaza and fenofibrate. Rechecking lipids, we will add Livalo if lipids are still elevated. He was intolerant of niacin, vaccine but was too expensive. He has had excessive myalgias with atorvastatin, simvastatin, rosuvastatin.  Obstructive uropathy Overall doing well on Flomax, should be taking Avodart as well.  Mass of leg, left Posterolateral corner of the left knee, feels like a lipoma. Adding an official ultrasound.  Primary osteoarthritis of left knee Moderate pain, previous injection was in 2017. Switching from meloxicam to Celebrex  I spent 40 minutes with this patient, greater than 50% was face-to-face time counseling regarding the above diagnoses ___________________________________________ [name]. Dianah Field, M.D., ABFM., CAQSM. Primary Care and [place] Instructor of [place] of [facility] of Medicine

## 2017-11-15 NOTE — Assessment & Plan Note (Signed)
Moderate pain, previous injection was in 2017. Switching from meloxicam to Celebrex

## 2017-11-15 NOTE — Assessment & Plan Note (Signed)
Posterolateral corner of the left knee, feels like a lipoma. Adding an official ultrasound.

## 2017-11-19 DIAGNOSIS — Z713 Dietary counseling and surveillance: Secondary | ICD-10-CM | POA: Diagnosis not present

## 2017-11-25 ENCOUNTER — Encounter: Payer: Self-pay | Admitting: Sports Medicine

## 2017-11-25 DIAGNOSIS — E6609 Other obesity due to excess calories: Secondary | ICD-10-CM

## 2017-11-25 MED ORDER — PHENTERMINE HCL 15 MG PO CAPS: 15.0000 mg | ORAL_CAPSULE | ORAL | 0 refills | Status: DC

## 2017-11-25 MED ORDER — TOPIRAMATE 50 MG PO TABS: ORAL_TABLET | ORAL | 1 refills | Status: DC

## 2017-11-29 ENCOUNTER — Other Ambulatory Visit: Payer: BLUE CROSS/BLUE SHIELD

## 2017-12-06 DIAGNOSIS — Z713 Dietary counseling and surveillance: Secondary | ICD-10-CM | POA: Diagnosis not present

## 2017-12-13 ENCOUNTER — Ambulatory Visit: Payer: BLUE CROSS/BLUE SHIELD | Admitting: Sports Medicine

## 2018-01-08 DIAGNOSIS — M5442 Lumbago with sciatica, left side: Secondary | ICD-10-CM | POA: Diagnosis not present

## 2018-01-15 DIAGNOSIS — M5442 Lumbago with sciatica, left side: Secondary | ICD-10-CM | POA: Diagnosis not present

## 2018-01-21 ENCOUNTER — Other Ambulatory Visit: Payer: Self-pay | Admitting: Sports Medicine

## 2018-01-22 DIAGNOSIS — M5442 Lumbago with sciatica, left side: Secondary | ICD-10-CM | POA: Diagnosis not present

## 2018-01-29 ENCOUNTER — Encounter: Payer: Self-pay | Admitting: Sports Medicine

## 2018-01-29 ENCOUNTER — Ambulatory Visit (INDEPENDENT_AMBULATORY_CARE_PROVIDER_SITE_OTHER): Payer: BLUE CROSS/BLUE SHIELD | Admitting: Sports Medicine

## 2018-01-29 DIAGNOSIS — M5416 Radiculopathy, lumbar region: Secondary | ICD-10-CM | POA: Diagnosis not present

## 2018-01-29 MED ORDER — PREDNISONE 10 MG (48) PO TBPK: ORAL_TABLET | Freq: Every day | ORAL | 0 refills | Status: DC

## 2018-01-29 MED ORDER — ETODOLAC ER 600 MG PO TB24: 600.0000 mg | ORAL_TABLET | Freq: Every day | ORAL | 3 refills | Status: DC

## 2018-01-29 NOTE — Progress Notes (Signed)
Subjective:    CC: Several issues  HPI: Richard Gross has multifactorial low back pain related to facet arthritis and spinal stenosis, clinically he is presenting with more left L4 distribution radiculitis, he has had L4-L5 transforaminal epidurals that provided good relief.  He has been seeing Dr. Francesco Runner with pain medicine, had medial branch blocks which provided good relief of his axial pain, and is currently being set up for radio frequency ablation.  In the meantime he is having some pain, would like some advice to get relief until he can have an epidural or an RFA with Dr. to get back into the country.  I reviewed the past medical history, family history, social history, surgical history, and allergies today and no changes were needed.  Please see the problem list section below in epic for further details.  Past Medical History: No past medical history on file. Past Surgical History: No past surgical history on file. Social History: Social History   Socioeconomic History  . Marital status: Married    Spouse name: Not on file  . Number of children: Not on file  . Years of education: Not on file  . Highest education level: Not on file  Occupational History  . Not on file  Social Needs  . Financial resource strain: Not on file  . Food insecurity:    Worry: Not on file    Inability: Not on file  . Transportation needs:    Medical: Not on file    Non-medical: Not on file  Tobacco Use  . Smoking status: Former Smoker    Last attempt to quit: 07/17/1995    Years since quitting: 22.5  . Smokeless tobacco: Never Used  Substance and Sexual Activity  . Alcohol use: No  . Drug use: No  . Sexual activity: Yes    Birth control/protection: None  Lifestyle  . Physical activity:    Days per week: Not on file    Minutes per session: Not on file  . Stress: Not on file  Relationships  . Social connections:    Talks on phone: Not on file    Gets together: Not on file    Attends religious  service: Not on file    Active member of club or organization: Not on file    Attends meetings of clubs or organizations: Not on file    Relationship status: Not on file  Other Topics Concern  . Not on file  Social History Narrative  . Not on file   Family History: Family History  Problem Relation Age of Onset  . Cancer Mother   . Cancer Father   . Heart disease Sister   . Diabetes Sister   . Hyperlipidemia Sister   . Hypertension Sister   . Heart disease Brother   . Diabetes Brother   . Hyperlipidemia Brother   . Hypertension Brother    Allergies: Allergies  Allergen Reactions  . Amoxicillin Nausea And Vomiting  . Statins    Medications: See med rec.  Review of Systems: No fevers, chills, night sweats, weight loss, chest pain, or shortness of breath.   Objective:    General: Well Developed, well nourished, and in no acute distress.  Neuro: Alert and oriented x3, extra-ocular muscles intact, sensation grossly intact.  HEENT: Normocephalic, atraumatic, pupils equal round reactive to light, neck supple, no masses, no lymphadenopathy, thyroid nonpalpable.  Skin: Warm and dry, no rashes. Cardiac: Regular rate and rhythm, no murmurs rubs or gallops, no lower extremity edema.  Respiratory: Clear to auscultation bilaterally. Not using accessory muscles, speaking in full sentences.  Impression and Recommendations:    Lumbar radiculopathy Moderate to severe central canal stenosis at L3-L4, lesser so at L4-L5. Left-sided worse than right L4 radicular symptoms. He is working with Dr. Francesco Runner on facet radio frequency ablation, medial branch blocks were effective. Dr. Francesco Runner does not come back into town for 2 weeks, he is going to try to get an L4-L5 transforaminal epidural with Dr. Francesco Runner when he gets back. I am going to extend his prednisone to a 12-day taper. No improvement with Celebrex, switching to Lodine which she will start with prednisone finishes. I would like him to  also have a second opinion from neurosurgery downstairs for discussion of L3-L4 and L4-L5 decompression.  I spent 40 minutes with this patient, greater than 50% was face-to-face time counseling regarding the above diagnoses ___________________________________________ [name]. Dianah Field, M.D., ABFM., CAQSM. Primary Care and [place] Instructor of [place] of [facility] of Medicine

## 2018-01-29 NOTE — Assessment & Plan Note (Addendum)
Moderate to severe central canal stenosis at L3-L4, lesser so at L4-L5. Left-sided worse than right L4 radicular symptoms. He is working with Dr. Francesco Runner on facet radio frequency ablation, medial branch blocks were effective. Dr. Francesco Runner does not come back into town for 2 weeks, he is going to try to get an L4-L5 transforaminal epidural with Dr. Francesco Runner when he gets back. I am going to extend his prednisone to a 12-day taper. No improvement with Celebrex, switching to Lodine which she will start with prednisone finishes. I would like him to also have a second opinion from neurosurgery downstairs for discussion of L3-L4 and L4-L5 decompression.

## 2018-02-02 DIAGNOSIS — Z880 Allergy status to penicillin: Secondary | ICD-10-CM | POA: Diagnosis not present

## 2018-02-02 DIAGNOSIS — K4090 Unilateral inguinal hernia, without obstruction or gangrene, not specified as recurrent: Secondary | ICD-10-CM | POA: Diagnosis not present

## 2018-02-02 DIAGNOSIS — Z79899 Other long term (current) drug therapy: Secondary | ICD-10-CM | POA: Diagnosis not present

## 2018-02-02 DIAGNOSIS — Z791 Long term (current) use of non-steroidal anti-inflammatories (NSAID): Secondary | ICD-10-CM | POA: Diagnosis not present

## 2018-02-02 DIAGNOSIS — N134 Hydroureter: Secondary | ICD-10-CM | POA: Diagnosis not present

## 2018-02-02 DIAGNOSIS — N200 Calculus of kidney: Secondary | ICD-10-CM | POA: Diagnosis not present

## 2018-02-02 DIAGNOSIS — Z888 Allergy status to other drugs, medicaments and biological substances status: Secondary | ICD-10-CM | POA: Diagnosis not present

## 2018-02-02 DIAGNOSIS — K5730 Diverticulosis of large intestine without perforation or abscess without bleeding: Secondary | ICD-10-CM | POA: Diagnosis not present

## 2018-02-02 DIAGNOSIS — R1031 Right lower quadrant pain: Secondary | ICD-10-CM | POA: Diagnosis not present

## 2018-02-02 DIAGNOSIS — N132 Hydronephrosis with renal and ureteral calculous obstruction: Secondary | ICD-10-CM | POA: Diagnosis not present

## 2018-02-05 DIAGNOSIS — M5442 Lumbago with sciatica, left side: Secondary | ICD-10-CM | POA: Diagnosis not present

## 2018-02-11 ENCOUNTER — Other Ambulatory Visit: Payer: Self-pay | Admitting: Sports Medicine

## 2018-02-11 DIAGNOSIS — Z79899 Other long term (current) drug therapy: Secondary | ICD-10-CM | POA: Diagnosis not present

## 2018-02-11 DIAGNOSIS — M5136 Other intervertebral disc degeneration, lumbar region: Secondary | ICD-10-CM | POA: Diagnosis not present

## 2018-02-11 DIAGNOSIS — G8929 Other chronic pain: Secondary | ICD-10-CM | POA: Diagnosis not present

## 2018-02-11 DIAGNOSIS — M48061 Spinal stenosis, lumbar region without neurogenic claudication: Secondary | ICD-10-CM | POA: Diagnosis not present

## 2018-02-11 DIAGNOSIS — M47816 Spondylosis without myelopathy or radiculopathy, lumbar region: Secondary | ICD-10-CM | POA: Diagnosis not present

## 2018-02-11 DIAGNOSIS — M1712 Unilateral primary osteoarthritis, left knee: Secondary | ICD-10-CM

## 2018-02-11 DIAGNOSIS — Z880 Allergy status to penicillin: Secondary | ICD-10-CM | POA: Diagnosis not present

## 2018-02-11 DIAGNOSIS — Z888 Allergy status to other drugs, medicaments and biological substances status: Secondary | ICD-10-CM | POA: Diagnosis not present

## 2018-02-19 DIAGNOSIS — M5442 Lumbago with sciatica, left side: Secondary | ICD-10-CM | POA: Diagnosis not present

## 2018-02-20 ENCOUNTER — Other Ambulatory Visit: Payer: Self-pay | Admitting: Sports Medicine

## 2018-02-20 DIAGNOSIS — N139 Obstructive and reflux uropathy, unspecified: Secondary | ICD-10-CM

## 2018-02-26 DIAGNOSIS — M5442 Lumbago with sciatica, left side: Secondary | ICD-10-CM | POA: Diagnosis not present

## 2018-03-03 ENCOUNTER — Encounter: Payer: Self-pay | Admitting: Sports Medicine

## 2018-03-03 DIAGNOSIS — E6609 Other obesity due to excess calories: Secondary | ICD-10-CM

## 2018-03-03 MED ORDER — PHENTERMINE HCL 15 MG PO CAPS: 15.0000 mg | ORAL_CAPSULE | ORAL | 0 refills | Status: DC

## 2018-03-05 DIAGNOSIS — M5442 Lumbago with sciatica, left side: Secondary | ICD-10-CM | POA: Diagnosis not present

## 2018-03-11 DIAGNOSIS — M5442 Lumbago with sciatica, left side: Secondary | ICD-10-CM | POA: Diagnosis not present

## 2018-04-06 ENCOUNTER — Other Ambulatory Visit: Payer: Self-pay | Admitting: Sports Medicine

## 2018-04-06 DIAGNOSIS — E783 Hyperchylomicronemia: Secondary | ICD-10-CM

## 2018-04-09 ENCOUNTER — Encounter: Payer: Self-pay | Admitting: Sports Medicine

## 2018-04-09 DIAGNOSIS — E783 Hyperchylomicronemia: Secondary | ICD-10-CM

## 2018-04-09 DIAGNOSIS — E291 Testicular hypofunction: Secondary | ICD-10-CM

## 2018-04-09 DIAGNOSIS — N139 Obstructive and reflux uropathy, unspecified: Secondary | ICD-10-CM

## 2018-04-09 DIAGNOSIS — Z0000 Encounter for general adult medical examination without abnormal findings: Secondary | ICD-10-CM

## 2018-04-11 ENCOUNTER — Encounter: Payer: Self-pay | Admitting: Sports Medicine

## 2018-04-11 ENCOUNTER — Ambulatory Visit (INDEPENDENT_AMBULATORY_CARE_PROVIDER_SITE_OTHER): Payer: BLUE CROSS/BLUE SHIELD | Admitting: Sports Medicine

## 2018-04-11 VITALS — BP 130/82 | HR 102 | Ht 69.0 in | Wt 246.0 lb

## 2018-04-11 DIAGNOSIS — Z23 Encounter for immunization: Secondary | ICD-10-CM | POA: Diagnosis not present

## 2018-04-11 DIAGNOSIS — N139 Obstructive and reflux uropathy, unspecified: Secondary | ICD-10-CM | POA: Diagnosis not present

## 2018-04-11 DIAGNOSIS — Z0000 Encounter for general adult medical examination without abnormal findings: Secondary | ICD-10-CM

## 2018-04-11 DIAGNOSIS — M5416 Radiculopathy, lumbar region: Secondary | ICD-10-CM | POA: Diagnosis not present

## 2018-04-11 DIAGNOSIS — E783 Hyperchylomicronemia: Secondary | ICD-10-CM | POA: Diagnosis not present

## 2018-04-11 DIAGNOSIS — E291 Testicular hypofunction: Secondary | ICD-10-CM | POA: Diagnosis not present

## 2018-04-11 MED ORDER — ETODOLAC ER 600 MG PO TB24: 600.0000 mg | ORAL_TABLET | Freq: Every day | ORAL | 3 refills | Status: DC

## 2018-04-11 NOTE — Assessment & Plan Note (Signed)
Continues to work with Dr. Francesco Runner, they are discussing facet radio frequency ablation. If persistent discomfort we will consider surgical referral, he does have moderate to severe central canal stenosis at L3-L4 and lesser so at L4-L5. Refilling Lodine.

## 2018-04-11 NOTE — Progress Notes (Signed)
Subjective:    CC: Annual physical  HPI:  Richard Gross is here for his physical, no complaints with the exception of his back, see below for further details.  I reviewed the past medical history, family history, social history, surgical history, and allergies today and no changes were needed.  Please see the problem list section below in epic for further details.  Past Medical History: No past medical history on file. Past Surgical History: No past surgical history on file. Social History: Social History   Socioeconomic History  . Marital status: Married    Spouse name: Not on file  . Number of children: Not on file  . Years of education: Not on file  . Highest education level: Not on file  Occupational History  . Not on file  Social Needs  . Financial resource strain: Not on file  . Food insecurity:    Worry: Not on file    Inability: Not on file  . Transportation needs:    Medical: Not on file    Non-medical: Not on file  Tobacco Use  . Smoking status: Former Smoker    Last attempt to quit: 07/17/1995    Years since quitting: 22.7  . Smokeless tobacco: Never Used  Substance and Sexual Activity  . Alcohol use: No  . Drug use: No  . Sexual activity: Yes    Birth control/protection: None  Lifestyle  . Physical activity:    Days per week: Not on file    Minutes per session: Not on file  . Stress: Not on file  Relationships  . Social connections:    Talks on phone: Not on file    Gets together: Not on file    Attends religious service: Not on file    Active member of club or organization: Not on file    Attends meetings of clubs or organizations: Not on file    Relationship status: Not on file  Other Topics Concern  . Not on file  Social History Narrative  . Not on file   Family History: Family History  Problem Relation Age of Onset  . Cancer Mother   . Cancer Father   . Heart disease Sister   . Diabetes Sister   . Hyperlipidemia Sister   . Hypertension  Sister   . Heart disease Brother   . Diabetes Brother   . Hyperlipidemia Brother   . Hypertension Brother    Allergies: Allergies  Allergen Reactions  . Amoxicillin Nausea And Vomiting  . Statins    Medications: See med rec.  Review of Systems: No headache, visual changes, nausea, vomiting, diarrhea, constipation, dizziness, abdominal pain, skin rash, fevers, chills, night sweats, swollen lymph nodes, weight loss, chest pain, body aches, joint swelling, muscle aches, shortness of breath, mood changes, visual or auditory hallucinations.  Objective:    General: Well Developed, well nourished, and in no acute distress.  Neuro: Alert and oriented x3, extra-ocular muscles intact, sensation grossly intact. Cranial nerves II through XII are intact, motor, sensory, and coordinative functions are all intact. HEENT: Normocephalic, atraumatic, pupils equal round reactive to light, neck supple, no masses, no lymphadenopathy, thyroid nonpalpable. Oropharynx, nasopharynx, external ear canals are unremarkable. Skin: Warm and dry, no rashes noted.  Cardiac: Regular rate and rhythm, no murmurs rubs or gallops.  Respiratory: Clear to auscultation bilaterally. Not using accessory muscles, speaking in full sentences.  Abdominal: Soft, nontender, nondistended, positive bowel sounds, no masses, no organomegaly.  Musculoskeletal: Shoulder, elbow, wrist, hip, knee, ankle stable,  and with full range of motion.  Impression and Recommendations:    The patient was counselled, risk factors were discussed, anticipatory guidance given.  Annual physical exam Annual physical as above. Flu shot today. Up-to-date on screening measures.  Lumbar radiculopathy Continues to work with Dr. Francesco Runner, they are discussing facet radio frequency ablation. If persistent discomfort we will consider surgical referral, he does have moderate to severe central canal stenosis at L3-L4 and lesser so at L4-L5. Refilling  Lodine. ___________________________________________ [name]. Dianah Field, M.D., ABFM., CAQSM. Primary Care and [place] Instructor of [place] of [facility] of Medicine

## 2018-04-11 NOTE — Assessment & Plan Note (Signed)
Annual physical as above. Flu shot today. Up-to-date on screening measures.

## 2018-04-14 LAB — COMPLETE METABOLIC PANEL WITH GFR
ALT: 25 U/L (ref 9–46)
AST: 21 U/L (ref 10–35)
Albumin: 4.4 g/dL (ref 3.6–5.1)
BUN: 20 mg/dL (ref 7–25)
Calcium: 9.6 mg/dL (ref 8.6–10.3)
Chloride: 108 mmol/L (ref 98–110)
GFR, Est African American: 84 mL/min/{1.73_m2} (ref >=60)
GFR, Est Non African American: 73 mL/min/{1.73_m2} (ref >=60)
Glucose, Bld: 92 mg/dL (ref 65–99)
Total Bilirubin: 0.6 mg/dL (ref 0.2–1.2)
Total Protein: 6.4 g/dL (ref 6.1–8.1)

## 2018-04-14 LAB — CBC
HCT: 43.7 % (ref 38.5–50.0)
Hemoglobin: 15 g/dL (ref 13.2–17.1)
MCH: 30.8 pg (ref 27.0–33.0)
MCHC: 34.3 g/dL (ref 32.0–36.0)
MCV: 89.7 fL (ref 80.0–100.0)
MPV: 11.3 fL (ref 7.5–12.5)
Platelets: 237 10*3/uL (ref 140–400)
RBC: 4.87 10*6/uL (ref 4.20–5.80)
RDW: 12.7 % (ref 11.0–15.0)
WBC: 3.9 Thousand/uL (ref 3.8–10.8)

## 2018-04-14 LAB — LIPID PANEL
Cholesterol: 216 mg/dL — ABNORMAL HIGH (ref <200)
HDL: 37 mg/dL — ABNORMAL LOW (ref >40)
LDL Cholesterol (Calc): 141 mg/dL (calc) — ABNORMAL HIGH
Non-HDL Cholesterol (Calc): 179 mg/dL — ABNORMAL HIGH (ref <130)
Total CHOL/HDL Ratio: 5.8 (calc) — ABNORMAL HIGH (ref <5.0)
Triglycerides: 235 mg/dL — ABNORMAL HIGH (ref <150)

## 2018-04-14 LAB — PSA, TOTAL AND FREE
PSA, % Free: 50 % (calc) (ref >25)
PSA, Free: 0.1 ng/mL
PSA, Total: 0.2 ng/mL (ref <=4.0)

## 2018-04-14 LAB — COMPLETE METABOLIC PANEL WITHOUT GFR
AG Ratio: 2.2 (calc) (ref 1.0–2.5)
Alkaline phosphatase (APISO): 47 U/L (ref 40–115)
CO2: 26 mmol/L (ref 20–32)
Creat: 1.11 mg/dL (ref 0.70–1.33)
Globulin: 2 g/dL (ref 1.9–3.7)
Potassium: 4.4 mmol/L (ref 3.5–5.3)
Sodium: 142 mmol/L (ref 135–146)

## 2018-04-14 LAB — TESTOSTERONE, FREE & TOTAL
Free Testosterone: 97.3 pg/mL (ref 35.0–155.0)
Testosterone, Total, LC-MS-MS: 653 ng/dL (ref 250–1100)

## 2018-04-15 DIAGNOSIS — Z713 Dietary counseling and surveillance: Secondary | ICD-10-CM | POA: Diagnosis not present

## 2018-04-25 ENCOUNTER — Encounter: Payer: Self-pay | Admitting: Sports Medicine

## 2018-04-25 DIAGNOSIS — E6609 Other obesity due to excess calories: Secondary | ICD-10-CM

## 2018-04-26 MED ORDER — PHENTERMINE HCL 15 MG PO CAPS: 15.0000 mg | ORAL_CAPSULE | ORAL | 0 refills | Status: DC

## 2018-05-02 DIAGNOSIS — M47816 Spondylosis without myelopathy or radiculopathy, lumbar region: Secondary | ICD-10-CM | POA: Diagnosis not present

## 2018-05-14 DIAGNOSIS — Z713 Dietary counseling and surveillance: Secondary | ICD-10-CM | POA: Diagnosis not present

## 2018-06-17 DIAGNOSIS — M545 Low back pain: Secondary | ICD-10-CM | POA: Diagnosis not present

## 2018-06-20 DIAGNOSIS — M47816 Spondylosis without myelopathy or radiculopathy, lumbar region: Secondary | ICD-10-CM | POA: Diagnosis not present

## 2018-06-20 DIAGNOSIS — M5136 Other intervertebral disc degeneration, lumbar region: Secondary | ICD-10-CM | POA: Diagnosis not present

## 2018-06-20 DIAGNOSIS — M545 Low back pain: Secondary | ICD-10-CM | POA: Diagnosis not present

## 2018-07-03 DIAGNOSIS — M545 Low back pain: Secondary | ICD-10-CM | POA: Diagnosis not present

## 2018-08-14 ENCOUNTER — Encounter: Payer: Self-pay | Admitting: Family Medicine

## 2018-08-14 ENCOUNTER — Ambulatory Visit (INDEPENDENT_AMBULATORY_CARE_PROVIDER_SITE_OTHER): Payer: BLUE CROSS/BLUE SHIELD | Admitting: Family Medicine

## 2018-08-14 VITALS — BP 145/75 | HR 105 | Temp 98.1°F | Ht 69.0 in | Wt 245.0 lb

## 2018-08-14 DIAGNOSIS — R05 Cough: Secondary | ICD-10-CM | POA: Diagnosis not present

## 2018-08-14 DIAGNOSIS — R059 Cough, unspecified: Secondary | ICD-10-CM

## 2018-08-14 MED ORDER — AZITHROMYCIN 250 MG PO TABS: 250.0000 mg | ORAL_TABLET | Freq: Every day | ORAL | 0 refills | Status: DC

## 2018-08-14 MED ORDER — PREDNISONE 10 MG PO TABS: 30.0000 mg | ORAL_TABLET | Freq: Every day | ORAL | 0 refills | Status: DC

## 2018-08-14 NOTE — Patient Instructions (Addendum)
Thank you for coming in today. Take over the counter muccinex DM twice daily.  Use sugar free menthol cough drops as needed.  Use the tessalon pearles for cough as needed.   Fill and take the prednsione if not getting better.   Fill and take the azithromycin antibitotic if worse.  Call or go to the emergency room if you get worse, have trouble breathing, have chest pains, or palpitations.    Acute Bronchitis, Adult  Acute bronchitis is sudden (acute) swelling of the air tubes (bronchi) in the lungs. Acute bronchitis causes these tubes to fill with mucus, which can make it hard to breathe. It can also cause coughing or wheezing. In adults, acute bronchitis usually goes away within 2 weeks. A cough caused by bronchitis may last up to 3 weeks. Smoking, allergies, and asthma can make the condition worse. Repeated episodes of bronchitis may cause further lung problems, such as chronic obstructive pulmonary disease (COPD). What are the causes? This condition can be caused by germs and by substances that irritate the lungs, including:  Cold and flu viruses. This condition is most often caused by the same virus that causes a cold.  Bacteria.  Exposure to tobacco smoke, dust, fumes, and air pollution. What increases the risk? This condition is more likely to develop in people who:  Have close contact with someone with acute bronchitis.  Are exposed to lung irritants, such as tobacco smoke, dust, fumes, and vapors.  Have a weak immune system.  Have a respiratory condition such as asthma. What are the signs or symptoms? Symptoms of this condition include:  A cough.  Coughing up clear, yellow, or green mucus.  Wheezing.  Chest congestion.  Shortness of breath.  A fever.  Body aches.  Chills.  A sore throat. How is this diagnosed? This condition is usually diagnosed with a physical exam. During the exam, your health care provider may order tests, such as chest X-rays, to  rule out other conditions. He or she may also:  Test a sample of your mucus for bacterial infection.  Check the level of oxygen in your blood. This is done to check for pneumonia.  Do a chest X-ray or lung function testing to rule out pneumonia and other conditions.  Perform blood tests. Your health care provider will also ask about your symptoms and medical history. How is this treated? Most cases of acute bronchitis clear up over time without treatment. Your health care provider may recommend:  Drinking more fluids. Drinking more makes your mucus thinner, which may make it easier to breathe.  Taking a medicine for a fever or cough.  Taking an antibiotic medicine.  Using an inhaler to help improve shortness of breath and to control a cough.  Using a cool mist vaporizer or humidifier to make it easier to breathe. Follow these instructions at home: Medicines  Take over-the-counter and prescription medicines only as told by your health care provider.  If you were prescribed an antibiotic, take it as told by your health care provider. Do not stop taking the antibiotic even if you start to feel better. General instructions   Get plenty of rest.  Drink enough fluids to keep your urine pale yellow.  Avoid smoking and secondhand smoke. Exposure to cigarette smoke or irritating chemicals will make bronchitis worse. If you smoke and you need help quitting, ask your health care provider. Quitting smoking will help your lungs heal faster.  Use an inhaler, cool mist vaporizer, or humidifier as  told by your health care provider.  Keep all follow-up visits as told by your health care provider. This is important. How is this prevented? To lower your risk of getting this condition again:  Wash your hands often with soap and water. If soap and water are not available, use hand sanitizer.  Avoid contact with people who have cold symptoms.  Try not to touch your hands to your mouth, nose,  or eyes.  Make sure to get the flu shot every year. Contact a health care provider if:  Your symptoms do not improve in 2 weeks of treatment. Get help right away if:  You cough up blood.  You have chest pain.  You have severe shortness of breath.  You become dehydrated.  You faint or keep feeling like you are going to faint.  You keep vomiting.  You have a severe headache.  Your fever or chills gets worse. This information is not intended to replace advice given to you by your health care provider. Make sure you discuss any questions you have with your health care provider. Document Released: 08/09/2004 Document Revised: 02/13/2017 Document Reviewed: 12/21/2015 Elsevier Interactive Patient Education  2019 [employer].

## 2018-08-14 NOTE — Progress Notes (Signed)
Richard Gross is a 58 y.o. male who presents to [facility]: [place] today for cough congestion runny nose.  Symptoms present for about 3 or 4 days.  Patient notes sick contacts at work.  Has not tried much treatment yet.  He denies any shortness of breath or trouble breathing.  No vomiting diarrhea or abdominal pain.  No history of asthma or COPD.  He quit smoking over 20 years ago.   ROS as above:  Exam:  BP (!) 145/75   Pulse (!) 105   Temp 98.1 F (36.7 C) (Oral)   Ht 5\' 9"  (1.753 m)   Wt 245 lb (111.1 kg)   SpO2 98%   BMI 36.18 kg/m  Wt Readings from Last 5 Encounters:  08/14/18 245 lb (111.1 kg)  04/11/18 246 lb (111.6 kg)  01/29/18 241 lb (109.3 kg)  11/15/17 246 lb (111.6 kg)  08/16/17 245 lb (111.1 kg)    Gen: Well NAD HEENT: EOMI,  MMM clear nasal discharge with inflamed nasal turbinates.  Normal posterior pharynx.  Normal tympanic membranes.  Sinuses are nontender. Lungs: Normal work of breathing. CTABL Heart: RRR no MRG Abd: NABS, Soft. Nondistended, Nontender Exts: Brisk capillary refill, warm and well perfused.   Lab and Radiology Results No results found for this or any previous visit (from the past 72 hour(s)). No results found.    Assessment and Plan: 58 y.o. male with cough likely viral URI.  Symptomatic management with over-the-counter medications and with Tessalon Perles.  Backup prednisone and azithromycin printed.  Patient will fill these medications if not improving or if worsening respectively.  Recheck as needed.  PDMP not reviewed this encounter. No orders of the defined types were placed in this encounter.  Meds ordered this encounter  Medications  . azithromycin (ZITHROMAX) 250 MG tablet    Sig: Take 1 tablet (250 mg total) by mouth daily. Take first 2 tablets together, then 1 every day until finished.    Dispense:  6 tablet   Refill:  0  . predniSONE (DELTASONE) 10 MG tablet    Sig: Take 3 tablets (30 mg total) by mouth daily with breakfast.    Dispense:  15 tablet    Refill:  0     Historical information moved to improve visibility of documentation.  No past medical history on file. No past surgical history on file. Social History   Tobacco Use  . Smoking status: Former Smoker    Last attempt to quit: 07/17/1995    Years since quitting: 23.0  . Smokeless tobacco: Never Used  Substance Use Topics  . Alcohol use: No   family history includes Cancer in his father and mother; Diabetes in his brother and sister; Heart disease in his brother and sister; Hyperlipidemia in his brother and sister; Hypertension in his brother and sister.  Medications: Current Outpatient Medications  Medication Sig Dispense Refill  . dutasteride (AVODART) 0.5 MG capsule Take 1 capsule (0.5 mg total) by mouth daily. 30 capsule 11  . etodolac (LODINE XL) 600 MG 24 hr tablet Take 1 tablet (600 mg total) by mouth daily. 30 tablet 3  . fenofibrate 160 MG tablet TAKE 1 TABLET (160 MG TOTAL) BY MOUTH DAILY. 90 tablet 3  . gabapentin (NEURONTIN) 600 MG tablet TAKE 1 TABLET BY MOUTH TWICE A DAY 180 tablet 3  . Multiple Vitamin (MULTIVITAMIN) tablet Take 1 tablet by mouth daily.    Marland Kitchen omega-3 acid  ethyl esters (LOVAZA) 1 g capsule TAKE 2 CAPSULES (2 G TOTAL) BY MOUTH 2 (TWO) TIMES DAILY. 360 capsule 3  . phentermine 15 MG capsule Take 1 capsule (15 mg total) by mouth every morning. 90 capsule 0  . sildenafil (REVATIO) 20 MG tablet Take 1-5 tablets (20-100 mg total) by mouth as needed. 50 tablet 11  . tamsulosin (FLOMAX) 0.4 MG CAPS capsule TAKE 1 CAPSULE (0.4 MG TOTAL) BY MOUTH 2 (TWO) TIMES DAILY. 180 capsule 1  . topiramate (TOPAMAX) 50 MG tablet 1 TABLET BY MOUTH AT BEDTIME. 90 tablet 1  . azithromycin (ZITHROMAX) 250 MG tablet Take 1 tablet (250 mg total) by mouth daily. Take first 2 tablets together, then 1 every day until finished. 6  tablet 0  . predniSONE (DELTASONE) 10 MG tablet Take 3 tablets (30 mg total) by mouth daily with breakfast. 15 tablet 0   No current facility-administered medications for this visit.    Allergies  Allergen Reactions  . Amoxicillin Nausea And Vomiting  . Statins      Discussed warning signs or symptoms. Please see discharge instructions. Patient expresses understanding.

## 2018-08-19 ENCOUNTER — Other Ambulatory Visit: Payer: Self-pay | Admitting: Sports Medicine

## 2018-08-19 DIAGNOSIS — N139 Obstructive and reflux uropathy, unspecified: Secondary | ICD-10-CM

## 2018-09-04 ENCOUNTER — Other Ambulatory Visit: Payer: Self-pay | Admitting: Sports Medicine

## 2018-09-04 DIAGNOSIS — N139 Obstructive and reflux uropathy, unspecified: Secondary | ICD-10-CM

## 2018-09-27 ENCOUNTER — Other Ambulatory Visit: Payer: Self-pay | Admitting: Sports Medicine

## 2018-09-27 DIAGNOSIS — E6609 Other obesity due to excess calories: Secondary | ICD-10-CM

## 2018-09-27 DIAGNOSIS — M5416 Radiculopathy, lumbar region: Secondary | ICD-10-CM

## 2018-10-22 ENCOUNTER — Other Ambulatory Visit: Payer: Self-pay | Admitting: Sports Medicine

## 2018-10-22 DIAGNOSIS — M5416 Radiculopathy, lumbar region: Secondary | ICD-10-CM

## 2018-10-22 NOTE — Telephone Encounter (Signed)
6 months since lasts visit, set up for e visit

## 2018-10-22 NOTE — Telephone Encounter (Signed)
Left a message for patient to call to schedule an e-visit

## 2018-10-31 ENCOUNTER — Encounter: Payer: Self-pay | Admitting: Sports Medicine

## 2018-10-31 ENCOUNTER — Other Ambulatory Visit: Payer: Self-pay | Admitting: Sports Medicine

## 2018-10-31 ENCOUNTER — Ambulatory Visit (INDEPENDENT_AMBULATORY_CARE_PROVIDER_SITE_OTHER): Payer: BLUE CROSS/BLUE SHIELD | Admitting: Sports Medicine

## 2018-10-31 DIAGNOSIS — M4807 Spinal stenosis, lumbosacral region: Secondary | ICD-10-CM

## 2018-10-31 DIAGNOSIS — M48062 Spinal stenosis, lumbar region with neurogenic claudication: Secondary | ICD-10-CM

## 2018-10-31 DIAGNOSIS — E6609 Other obesity due to excess calories: Secondary | ICD-10-CM

## 2018-10-31 DIAGNOSIS — E783 Hyperchylomicronemia: Secondary | ICD-10-CM

## 2018-10-31 DIAGNOSIS — M5416 Radiculopathy, lumbar region: Secondary | ICD-10-CM

## 2018-10-31 MED ORDER — ETODOLAC ER 600 MG PO TB24: 600.0000 mg | ORAL_TABLET | Freq: Every day | ORAL | 0 refills | Status: DC

## 2018-10-31 MED ORDER — EVOLOCUMAB 140 MG/ML ~~LOC~~ SOAJ: 1.0000 | SUBCUTANEOUS | 11 refills | Status: DC

## 2018-10-31 NOTE — Telephone Encounter (Signed)
Please set him up with a telephone visit

## 2018-10-31 NOTE — Progress Notes (Signed)
Virtual Visit via Telephone   I connected with  Richard Gross  on 10/31/18 by telephone/telehealth and verified that I am speaking with the correct person using two identifiers.   I discussed the limitations, risks, security and privacy concerns of performing an evaluation and management service by telephone, including the higher likelihood of inaccurate diagnosis and treatment, and the availability of in person appointments.  We also discussed the likely need of an additional face to face encounter for complete and high quality delivery of care.  I also discussed with the patient that there may be a patient responsible charge related to this service. The patient expressed understanding and wishes to proceed.  Provider location is either at home or medical facility. Patient location is at their home, different from provider location. People involved in care of the patient during this telehealth encounter were myself, my nurse/medical assistant, and my front office/scheduling team member.  Subjective:    CC: Several issues  HPI: Obesity: Would like to restart phentermine, he does not have any vital signs to give me, agrees to get the vitals before we discussed weight loss treatment again.  Hyperlipidemia: At this point has had intolerances to multiple statins, as well as insufficient efficacy of fenofibrate at max dose, Lovaza.  He is agreeable to discuss Repatha.  Spinal stenosis: Has been through countless epidurals, facet injections and radiofrequency ablations, nothing seems to work, persistent back and buttock pain, better with leaning forward, occasional mild neurogenic claudication, no progressive weakness or bowel and bladder dysfunction.  I reviewed the past medical history, family history, social history, surgical history, and allergies today and no changes were needed.  Please see the problem list section below in epic for further details.  Past Medical History: No past medical  history on file. Past Surgical History: No past surgical history on file. Social History: Social History   Socioeconomic History  . Marital status: Married    Spouse name: Not on file  . Number of children: Not on file  . Years of education: Not on file  . Highest education level: Not on file  Occupational History  . Not on file  Social Needs  . Financial resource strain: Not on file  . Food insecurity:    Worry: Not on file    Inability: Not on file  . Transportation needs:    Medical: Not on file    Non-medical: Not on file  Tobacco Use  . Smoking status: Former Smoker    Last attempt to quit: 07/17/1995    Years since quitting: 23.3  . Smokeless tobacco: Never Used  Substance and Sexual Activity  . Alcohol use: No  . Drug use: No  . Sexual activity: Yes    Birth control/protection: None  Lifestyle  . Physical activity:    Days per week: Not on file    Minutes per session: Not on file  . Stress: Not on file  Relationships  . Social connections:    Talks on phone: Not on file    Gets together: Not on file    Attends religious service: Not on file    Active member of club or organization: Not on file    Attends meetings of clubs or organizations: Not on file    Relationship status: Not on file  Other Topics Concern  . Not on file  Social History Narrative  . Not on file   Family History: Family History  Problem Relation Age of Onset  . Cancer  Mother   . Cancer Father   . Heart disease Sister   . Diabetes Sister   . Hyperlipidemia Sister   . Hypertension Sister   . Heart disease Brother   . Diabetes Brother   . Hyperlipidemia Brother   . Hypertension Brother    Allergies: Allergies  Allergen Reactions  . Amoxicillin Nausea And Vomiting  . Statins    Medications: See med rec.  Review of Systems: No fevers, chills, night sweats, weight loss, chest pain, or shortness of breath.   Objective:    General: Speaking full sentences, no audible heavy  breathing.  Sounds alert and appropriately interactive.  No other physical exam performed due to the non-face to face nature of this visit.  Impression and Recommendations:    Hyperlipidemia Persistently elevated lipids, he is on max dose fenofibrate, Lovaza, we have tried statins in the past, these were intolerable. I think it is time for Repatha.   Recheck lipids 2 months after he starts the medication.  Lumbar spinal stenosis Severe spinal stenosis at L3-L4. Mild neurogenic claudication, at this point he has had countless epidurals, facet injections, RFA's. We tried multiple medications, I think it is time for surgical decompression. Referral to Dr. Lynann Bologna. He will likely want an updated MRI so I am going to go ahead and order this.  I discussed the above assessment and treatment plan with the patient. The patient was provided an opportunity to ask questions and all were answered. The patient agreed with the plan and demonstrated an understanding of the instructions.   The patient was advised to call back or seek an in-person evaluation if the symptoms worsen or if the condition fails to improve as anticipated.   I provided 25 minutes of non-face-to-face time during this encounter, 15 minutes of additional time was needed to gather information, review chart, records, communicate/coordinate with staff remotely, and complete documentation.   ___________________________________________ [name]. Dianah Field, M.D., ABFM., CAQSM. Primary Care and Sports Medicine [facility]  Adjunct Professor of [place] [facility]

## 2018-10-31 NOTE — Assessment & Plan Note (Signed)
Severe spinal stenosis at L3-L4. Mild neurogenic claudication, at this point he has had countless epidurals, facet injections, RFA's. We tried multiple medications, I think it is time for surgical decompression. Referral to Dr. Lynann Bologna. He will likely want an updated MRI so I am going to go ahead and order this.

## 2018-10-31 NOTE — Telephone Encounter (Signed)
Patient has been scheduled

## 2018-10-31 NOTE — Assessment & Plan Note (Signed)
Persistently elevated lipids, he is on max dose fenofibrate, Lovaza, we have tried statins in the past, these were intolerable. I think it is time for Repatha.   Recheck lipids 2 months after he starts the medication.

## 2018-11-03 ENCOUNTER — Telehealth: Payer: Self-pay | Admitting: Sports Medicine

## 2018-11-03 NOTE — Telephone Encounter (Signed)
  Message from [place] Effective from 11/03/2018 through 11/02/2019. Pharmacy aware and patient will wait on labs before he will get the [place] filled.

## 2018-11-11 DIAGNOSIS — M48062 Spinal stenosis, lumbar region with neurogenic claudication: Secondary | ICD-10-CM | POA: Diagnosis not present

## 2018-11-13 ENCOUNTER — Other Ambulatory Visit (HOSPITAL_COMMUNITY): Payer: Self-pay | Admitting: Orthopedic Surgery

## 2018-11-13 ENCOUNTER — Other Ambulatory Visit: Payer: Self-pay | Admitting: Orthopedic Surgery

## 2018-11-13 DIAGNOSIS — M48062 Spinal stenosis, lumbar region with neurogenic claudication: Secondary | ICD-10-CM

## 2018-11-14 DIAGNOSIS — H524 Presbyopia: Secondary | ICD-10-CM | POA: Diagnosis not present

## 2018-11-24 ENCOUNTER — Ambulatory Visit (INDEPENDENT_AMBULATORY_CARE_PROVIDER_SITE_OTHER): Payer: BLUE CROSS/BLUE SHIELD

## 2018-11-24 ENCOUNTER — Other Ambulatory Visit: Payer: Self-pay

## 2018-11-24 DIAGNOSIS — M48062 Spinal stenosis, lumbar region with neurogenic claudication: Secondary | ICD-10-CM

## 2018-11-24 DIAGNOSIS — M545 Low back pain: Secondary | ICD-10-CM | POA: Diagnosis not present

## 2018-12-01 DIAGNOSIS — M4807 Spinal stenosis, lumbosacral region: Secondary | ICD-10-CM | POA: Diagnosis not present

## 2018-12-17 ENCOUNTER — Other Ambulatory Visit: Payer: Self-pay | Admitting: Orthopedic Surgery

## 2018-12-26 NOTE — Pre-Procedure Instructions (Addendum)
Philip Kotlyar  12/26/2018     [id number] - [place], [place] - [address] [place] [address] Phone: [phone] Fax: [place], [place] [address] Phone: [phone] Fax: [phone]   Your procedure is scheduled on Wednesday, June 17th  Report to [facility] Entrance A at 6:30 A.M.  Call this number if you have problems the morning of surgery:  [phone]   Remember:  Do not eat after midnight. You may drink clear liquids until 5:30 A.M. Clear liquids allowed MHD:[id number], Juice (non-citric and without pulp), Carbonated beverages, Clear Tea, Black Coffee only, Plain Jell-O only, Gatorade and Plain Popsicles only   Please complete your PRE-SURGERY ENSURE that was provided to you by 5:30 A.M.  Please, if able, drink it in one setting. DO NOT SIP.'    Take these medicines the morning of surgery with A SIP OF WATER  dutasteride (AVODART) fenofibrate  gabapentin (NEURONTIN) tamsulosin (FLOMAX)  Follow your surgeon's instructions on when to stop Aspirin.  If no instructions were given by your surgeon then you will need to call the office to get those instructions.    As of today, STOP taking etodolac (LODINE XL) any Aspirin (unless otherwise instructed by your surgeon), Aleve, Naproxen, Ibuprofen, Motrin, Advil, Goody's, BC's, all herbal medications, fish oil, and all vitamins.   Do not wear jewelry, make-up or nail polish.  Do not wear lotions, powders, or perfumes/cologne, or deodorant.  Do not shave 48 hours prior to surgery.  Men may shave face and neck.  Do not bring valuables to the hospital.  [facility] is not responsible for any belongings or valuables.  Contacts, dentures or bridgework may not be worn into surgery.  Leave your suitcase in the car.  After surgery it may be brought to your room.  For patients admitted to the hospital,  discharge time will be determined by your treatment team.  Patients discharged the day of surgery will not be allowed to drive home.   Special instructions:   [place]- Preparing For Surgery  Before surgery, you can play an important role. Because skin is not sterile, your skin needs to be as free of germs as possible. You can reduce the number of germs on your skin by washing with CHG (chlorahexidine gluconate) Soap before surgery.  CHG is an antiseptic cleaner which kills germs and bonds with the skin to continue killing germs even after washing.    Oral Hygiene is also important to reduce your risk of infection.  Remember - BRUSH YOUR TEETH THE MORNING OF SURGERY WITH YOUR REGULAR TOOTHPASTE  Please do not use if you have an allergy to CHG or antibacterial soaps. If your skin becomes reddened/irritated stop using the CHG.  Do not shave (including legs and underarms) for at least 48 hours prior to first CHG shower. It is OK to shave your face.  Please follow these instructions carefully.   1. Shower the NIGHT BEFORE SURGERY and the MORNING OF SURGERY with CHG.   2. If you chose to wash your hair, wash your hair first as usual with your normal shampoo.  3. After you shampoo, rinse your hair and body thoroughly to remove the shampoo.  4. Use CHG as you would any other liquid soap. You can apply CHG directly to the skin and wash gently with a scrungie or a clean washcloth.   5.  Apply the CHG Soap to your body ONLY FROM THE NECK DOWN.  Do not use on open wounds or open sores. Avoid contact with your eyes, ears, mouth and genitals (private parts). Wash Face and genitals (private parts)  with your normal soap.  6. Wash thoroughly, paying special attention to the area where your surgery will be performed.  7. Thoroughly rinse your body with warm water from the neck down.  8. DO NOT shower/wash with your normal soap after using and rinsing off the CHG Soap.  9. Pat yourself dry with a  CLEAN TOWEL.  10. Wear CLEAN PAJAMAS to bed the night before surgery, wear comfortable clothes the morning of surgery  11. Place CLEAN SHEETS on your bed the night of your first shower and DO NOT SLEEP WITH PETS.  Day of Surgery:  Do not apply any deodorants/lotions.  Please wear clean clothes to the hospital/surgery center.   Remember to brush your teeth WITH YOUR REGULAR TOOTHPASTE.  Please read over the following fact sheets that you were given. Pain Booklet, Coughing and Deep Breathing, MRSA Information and Surgical Site Infection Prevention

## 2018-12-27 ENCOUNTER — Other Ambulatory Visit (HOSPITAL_COMMUNITY)
Admission: RE | Admit: 2018-12-27 | Discharge: 2018-12-27 | Disposition: A | Discharge status: Home / Self Care | Payer: BC Managed Care – PPO | Source: Ambulatory Visit | Attending: Orthopedic Surgery | Admitting: Orthopedic Surgery

## 2018-12-27 DIAGNOSIS — Z1159 Encounter for screening for other viral diseases: Secondary | ICD-10-CM | POA: Diagnosis not present

## 2018-12-29 ENCOUNTER — Other Ambulatory Visit: Payer: Self-pay

## 2018-12-29 ENCOUNTER — Encounter (HOSPITAL_COMMUNITY)
Admission: RE | Admit: 2018-12-29 | Discharge: 2018-12-29 | Disposition: A | Discharge status: Home / Self Care | Payer: BC Managed Care – PPO | Source: Ambulatory Visit | Attending: Orthopedic Surgery | Admitting: Orthopedic Surgery

## 2018-12-29 ENCOUNTER — Inpatient Hospital Stay (HOSPITAL_COMMUNITY): Admission: RE | Admit: 2018-12-29 | Payer: BLUE CROSS/BLUE SHIELD | Source: Ambulatory Visit

## 2018-12-29 ENCOUNTER — Encounter (HOSPITAL_COMMUNITY): Payer: Self-pay

## 2018-12-29 DIAGNOSIS — M4807 Spinal stenosis, lumbosacral region: Secondary | ICD-10-CM | POA: Diagnosis not present

## 2018-12-29 DIAGNOSIS — Z01812 Encounter for preprocedural laboratory examination: Secondary | ICD-10-CM | POA: Insufficient documentation

## 2018-12-29 HISTORY — DX: Personal history of urinary calculi: Z87.442

## 2018-12-29 LAB — SURGICAL PCR SCREEN
MRSA, PCR: NEGATIVE
Staphylococcus aureus: POSITIVE — AB

## 2018-12-29 LAB — NOVEL CORONAVIRUS, NAA (HOSP ORDER, SEND-OUT TO REF LAB; TAT 18-24 HRS): SARS-CoV-2, NAA: NOT DETECTED

## 2018-12-29 LAB — CBC WITH DIFFERENTIAL/PLATELET
Abs Immature Granulocytes: 0.01 10*3/uL (ref 0.00–0.07)
Basophils Absolute: 0.1 10*3/uL (ref 0.0–0.1)
Basophils Relative: 1 %
Eosinophils Absolute: 0.2 10*3/uL (ref 0.0–0.5)
Eosinophils Relative: 3 %
HCT: 47.4 % (ref 39.0–52.0)
Hemoglobin: 16.5 g/dL (ref 13.0–17.0)
Immature Granulocytes: 0 %
Lymphocytes Relative: 27 %
Lymphs Abs: 1.6 10*3/uL (ref 0.7–4.0)
MCH: 31.1 pg (ref 26.0–34.0)
MCHC: 34.8 g/dL (ref 30.0–36.0)
MCV: 89.3 fL (ref 80.0–100.0)
Monocytes Absolute: 0.6 10*3/uL (ref 0.1–1.0)
Monocytes Relative: 9 %
Neutro Abs: 3.5 10*3/uL (ref 1.7–7.7)
Neutrophils Relative %: 60 %
Platelets: 237 10*3/uL (ref 150–400)
RBC: 5.31 MIL/uL (ref 4.22–5.81)
RDW: 11.9 % (ref 11.5–15.5)
WBC: 5.8 10*3/uL (ref 4.0–10.5)
nRBC: 0 % (ref 0.0–0.2)

## 2018-12-29 LAB — URINALYSIS, ROUTINE W REFLEX MICROSCOPIC
Bilirubin Urine: NEGATIVE
Glucose, UA: NEGATIVE mg/dL
Hgb urine dipstick: NEGATIVE
Ketones, ur: NEGATIVE mg/dL
Leukocytes,Ua: NEGATIVE
Nitrite: NEGATIVE
Protein, ur: NEGATIVE mg/dL
Specific Gravity, Urine: 1.021 (ref 1.005–1.030)
pH: 5 (ref 5.0–8.0)

## 2018-12-29 LAB — COMPREHENSIVE METABOLIC PANEL
ALT: 29 U/L (ref 0–44)
AST: 22 U/L (ref 15–41)
Albumin: 4.4 g/dL (ref 3.5–5.0)
Alkaline Phosphatase: 54 U/L (ref 38–126)
Anion gap: 11 (ref 5–15)
BUN: 18 mg/dL (ref 6–20)
CO2: 21 mmol/L — ABNORMAL LOW (ref 22–32)
Calcium: 9.6 mg/dL (ref 8.9–10.3)
Chloride: 107 mmol/L (ref 98–111)
Creatinine, Ser: 1.06 mg/dL (ref 0.61–1.24)
GFR calc Af Amer: >60 mL/min (ref >60)
GFR calc non Af Amer: >60 mL/min (ref >60)
Glucose, Bld: 85 mg/dL (ref 70–99)
Potassium: 4.1 mmol/L (ref 3.5–5.1)
Sodium: 139 mmol/L (ref 135–145)
Total Bilirubin: 0.9 mg/dL (ref 0.3–1.2)
Total Protein: 6.7 g/dL (ref 6.5–8.1)

## 2018-12-29 LAB — APTT: aPTT: 32 seconds (ref 24–36)

## 2018-12-29 LAB — PROTIME-INR
INR: 1.1 (ref 0.8–1.2)
Prothrombin Time: 14.1 seconds (ref 11.4–15.2)

## 2018-12-29 NOTE — Progress Notes (Signed)
  Coronavirus Screening Negative COVID test done on 12-27-18 Have you experienced the following symptoms:  Cough yes/no: No Fever (>100.41F)  yes/no: No Runny nose yes/no: No Sore throat yes/no: No Difficulty breathing/shortness of breath  yes/no: No Loss of smell or taste-No Have you or a family member traveled in the last 14 days and where? yes/no: No   PCP - Dr. Dana Allan  Cardiologist - denies  Chest x-ray - NA  EKG - NA  Stress Test - denies  ECHO - denies  Cardiac Cath - denies  AICD-denies PM-denies LOOP-denies  Sleep Study - denies CPAP - NA   LABS-CBC,CMP,PT-INR,APTT,UA  ASA-denies  ERAS-Ensure given with instructions  HA1C-NA Fasting Blood Sugar -  Checks Blood Sugar _____ times a day  Anesthesia-N  Pt denies having chest pain, sob, or fever at this time. All instructions explained to the pt, with a verbal understanding of the material. Pt agrees to go over the instructions while at home for a better understanding. The opportunity to ask questions was provided.

## 2018-12-30 MED ORDER — VANCOMYCIN HCL 10 G IV SOLR
1500.0000 mg | INTRAVENOUS | Status: AC
  Administered 2018-12-31: 1500 mg via INTRAVENOUS
  Filled 2018-12-30: qty 1500

## 2018-12-30 NOTE — Anesthesia Preprocedure Evaluation (Addendum)
Anesthesia Evaluation  Patient identified by MRN, date of birth, ID band Patient awake    Reviewed: Allergy & Precautions, NPO status , Patient's Chart, lab work & pertinent test results  History of Anesthesia Complications Negative for: history of anesthetic complications  Airway Mallampati: II  TM Distance: >3 FB Neck ROM: Full    Dental  (+) Dental Advisory Given, Chipped,    Pulmonary former smoker,    breath sounds clear to auscultation       Cardiovascular negative cardio ROS   Rhythm:Regular Rate:Normal     Neuro/Psych  Bell's palsy  negative psych ROS   GI/Hepatic negative GI ROS, Neg liver ROS,   Endo/Other   Obesity   Renal/GU negative Renal ROS     Musculoskeletal  (+) Arthritis ,   Abdominal (+) + obese,   Peds  Hematology negative hematology ROS (+)   Anesthesia Other Findings   Reproductive/Obstetrics                            Anesthesia Physical Anesthesia Plan  ASA: II  Anesthesia Plan: General   Post-op Pain Management:    Induction: Intravenous  PONV Risk Score and Plan: 3 and Treatment may vary due to age or medical condition, Ondansetron, Midazolam and Scopolamine patch - Pre-op  Airway Management Planned: Oral ETT  Additional Equipment: None  Intra-op Plan:   Post-operative Plan: Extubation in OR  Informed Consent: I have reviewed the patients History and Physical, chart, labs and discussed the procedure including the risks, benefits and alternatives for the proposed anesthesia with the patient or authorized representative who has indicated his/her understanding and acceptance.     Dental advisory given  Plan Discussed with: CRNA and Anesthesiologist  Anesthesia Plan Comments:        Anesthesia Quick Evaluation

## 2018-12-31 ENCOUNTER — Encounter (HOSPITAL_COMMUNITY): Payer: Self-pay | Admitting: General Practice

## 2018-12-31 ENCOUNTER — Other Ambulatory Visit: Payer: Self-pay

## 2018-12-31 ENCOUNTER — Ambulatory Visit (HOSPITAL_COMMUNITY): Payer: BC Managed Care – PPO | Admitting: Anesthesiology

## 2018-12-31 ENCOUNTER — Ambulatory Visit (HOSPITAL_COMMUNITY): Payer: BC Managed Care – PPO

## 2018-12-31 ENCOUNTER — Encounter (HOSPITAL_COMMUNITY)
Admission: RE | Disposition: A | Discharge status: Home / Self Care | Payer: Self-pay | Source: Home / Self Care | Attending: Orthopedic Surgery

## 2018-12-31 ENCOUNTER — Observation Stay (HOSPITAL_COMMUNITY)
Admission: RE | Admit: 2018-12-31 | Discharge: 2019-01-01 | Disposition: A | Discharge status: Home / Self Care | Payer: BC Managed Care – PPO | Attending: Orthopedic Surgery | Admitting: Orthopedic Surgery

## 2018-12-31 DIAGNOSIS — Z79899 Other long term (current) drug therapy: Secondary | ICD-10-CM | POA: Insufficient documentation

## 2018-12-31 DIAGNOSIS — Z87891 Personal history of nicotine dependence: Secondary | ICD-10-CM | POA: Insufficient documentation

## 2018-12-31 DIAGNOSIS — M48062 Spinal stenosis, lumbar region with neurogenic claudication: Secondary | ICD-10-CM | POA: Diagnosis not present

## 2018-12-31 DIAGNOSIS — E669 Obesity, unspecified: Secondary | ICD-10-CM | POA: Insufficient documentation

## 2018-12-31 DIAGNOSIS — M4807 Spinal stenosis, lumbosacral region: Secondary | ICD-10-CM | POA: Diagnosis not present

## 2018-12-31 DIAGNOSIS — Z6836 Body mass index (BMI) 36.0-36.9, adult: Secondary | ICD-10-CM | POA: Diagnosis not present

## 2018-12-31 DIAGNOSIS — M5136 Other intervertebral disc degeneration, lumbar region: Secondary | ICD-10-CM | POA: Diagnosis not present

## 2018-12-31 DIAGNOSIS — M1712 Unilateral primary osteoarthritis, left knee: Secondary | ICD-10-CM | POA: Diagnosis not present

## 2018-12-31 DIAGNOSIS — M47816 Spondylosis without myelopathy or radiculopathy, lumbar region: Secondary | ICD-10-CM | POA: Diagnosis not present

## 2018-12-31 DIAGNOSIS — M5412 Radiculopathy, cervical region: Secondary | ICD-10-CM | POA: Diagnosis not present

## 2018-12-31 DIAGNOSIS — E785 Hyperlipidemia, unspecified: Secondary | ICD-10-CM | POA: Diagnosis not present

## 2018-12-31 DIAGNOSIS — Z419 Encounter for procedure for purposes other than remedying health state, unspecified: Secondary | ICD-10-CM

## 2018-12-31 DIAGNOSIS — M4800 Spinal stenosis, site unspecified: Secondary | ICD-10-CM | POA: Diagnosis present

## 2018-12-31 HISTORY — PX: LUMBAR LAMINECTOMY/DECOMPRESSION MICRODISCECTOMY: SHX5026

## 2018-12-31 SURGERY — LUMBAR LAMINECTOMY/DECOMPRESSION MICRODISCECTOMY
Anesthesia: General

## 2018-12-31 MED ORDER — METHYLPREDNISOLONE ACETATE 40 MG/ML IJ SUSP
INTRAMUSCULAR | Status: DC | PRN
  Administered 2018-12-31: 40 mg

## 2018-12-31 MED ORDER — SODIUM CHLORIDE 0.9% FLUSH: 3.0000 mL | INTRAVENOUS | Status: DC | PRN

## 2018-12-31 MED ORDER — 0.9 % SODIUM CHLORIDE (POUR BTL) OPTIME
TOPICAL | Status: DC | PRN
  Administered 2018-12-31 (×3): 1000 mL

## 2018-12-31 MED ORDER — MENTHOL 3 MG MT LOZG: 1.0000 | LOZENGE | OROMUCOSAL | Status: DC | PRN

## 2018-12-31 MED ORDER — LIDOCAINE 2% (20 MG/ML) 5 ML SYRINGE
INTRAMUSCULAR | Status: AC
  Filled 2018-12-31: qty 5

## 2018-12-31 MED ORDER — THROMBIN 20000 UNITS EX SOLR
CUTANEOUS | Status: DC | PRN
  Administered 2018-12-31: 20 mL via TOPICAL

## 2018-12-31 MED ORDER — BUPIVACAINE-EPINEPHRINE 0.25% -1:200000 IJ SOLN
INTRAMUSCULAR | Status: DC | PRN
  Administered 2018-12-31: 30 mL

## 2018-12-31 MED ORDER — TOPIRAMATE 25 MG PO TABS
50.0000 mg | ORAL_TABLET | Freq: Every day | ORAL | Status: DC
  Administered 2018-12-31: 50 mg via ORAL
  Filled 2018-12-31 (×2): qty 2

## 2018-12-31 MED ORDER — LIDOCAINE 2% (20 MG/ML) 5 ML SYRINGE
INTRAMUSCULAR | Status: DC | PRN
  Administered 2018-12-31: 80 mg via INTRAVENOUS

## 2018-12-31 MED ORDER — SODIUM CHLORIDE 0.9 % IV SOLN
INTRAVENOUS | Status: DC | PRN
  Administered 2018-12-31: 10:00:00 25 ug/min via INTRAVENOUS

## 2018-12-31 MED ORDER — OXYCODONE-ACETAMINOPHEN 5-325 MG PO TABS
1.0000 | ORAL_TABLET | ORAL | Status: DC | PRN
  Administered 2018-12-31 – 2019-01-01 (×5): 2 via ORAL
  Filled 2018-12-31 (×5): qty 2

## 2018-12-31 MED ORDER — PROPOFOL 10 MG/ML IV BOLUS
INTRAVENOUS | Status: AC
  Filled 2018-12-31: qty 20

## 2018-12-31 MED ORDER — METHYLPREDNISOLONE ACETATE 40 MG/ML IJ SUSP
INTRAMUSCULAR | Status: AC
  Filled 2018-12-31: qty 1

## 2018-12-31 MED ORDER — THROMBIN 20000 UNITS EX KIT
PACK | CUTANEOUS | Status: AC
  Filled 2018-12-31: qty 1

## 2018-12-31 MED ORDER — GABAPENTIN 600 MG PO TABS
600.0000 mg | ORAL_TABLET | Freq: Two times a day (BID) | ORAL | Status: DC
  Administered 2018-12-31 – 2019-01-01 (×2): 600 mg via ORAL
  Filled 2018-12-31 (×2): qty 1

## 2018-12-31 MED ORDER — FENTANYL CITRATE (PF) 250 MCG/5ML IJ SOLN
INTRAMUSCULAR | Status: AC
  Filled 2018-12-31: qty 5

## 2018-12-31 MED ORDER — DOCUSATE SODIUM 100 MG PO CAPS
100.0000 mg | ORAL_CAPSULE | Freq: Two times a day (BID) | ORAL | Status: DC
  Administered 2018-12-31 – 2019-01-01 (×3): 100 mg via ORAL
  Filled 2018-12-31 (×3): qty 1

## 2018-12-31 MED ORDER — ROCURONIUM BROMIDE 50 MG/5ML IV SOSY
PREFILLED_SYRINGE | INTRAVENOUS | Status: DC | PRN
  Administered 2018-12-31 (×3): 10 mg via INTRAVENOUS
  Administered 2018-12-31: 50 mg via INTRAVENOUS
  Administered 2018-12-31: 20 mg via INTRAVENOUS

## 2018-12-31 MED ORDER — SODIUM CHLORIDE 0.9% FLUSH
3.0000 mL | Freq: Two times a day (BID) | INTRAVENOUS | Status: DC
  Administered 2018-12-31: 3 mL via INTRAVENOUS

## 2018-12-31 MED ORDER — DIAZEPAM 5 MG PO TABS
5.0000 mg | ORAL_TABLET | Freq: Four times a day (QID) | ORAL | Status: DC | PRN
  Administered 2018-12-31 – 2019-01-01 (×4): 5 mg via ORAL
  Filled 2018-12-31 (×4): qty 1

## 2018-12-31 MED ORDER — SUCCINYLCHOLINE CHLORIDE 200 MG/10ML IV SOSY
PREFILLED_SYRINGE | INTRAVENOUS | Status: AC
  Filled 2018-12-31: qty 10

## 2018-12-31 MED ORDER — FENTANYL CITRATE (PF) 100 MCG/2ML IJ SOLN
INTRAMUSCULAR | Status: AC
  Filled 2018-12-31: qty 2

## 2018-12-31 MED ORDER — SUGAMMADEX SODIUM 200 MG/2ML IV SOLN
INTRAVENOUS | Status: DC | PRN
  Administered 2018-12-31: 300 mg via INTRAVENOUS

## 2018-12-31 MED ORDER — PANTOPRAZOLE SODIUM 40 MG IV SOLR
40.0000 mg | Freq: Every day | INTRAVENOUS | Status: DC
  Administered 2018-12-31: 40 mg via INTRAVENOUS
  Filled 2018-12-31: qty 40

## 2018-12-31 MED ORDER — FLEET ENEMA 7-19 GM/118ML RE ENEM: 1.0000 | ENEMA | Freq: Once | RECTAL | Status: DC | PRN

## 2018-12-31 MED ORDER — OXYCODONE HCL 5 MG PO TABS
ORAL_TABLET | ORAL | Status: AC
  Filled 2018-12-31: qty 1

## 2018-12-31 MED ORDER — METHYLENE BLUE 0.5 % INJ SOLN
INTRAVENOUS | Status: DC | PRN
  Administered 2018-12-31: 1 mL

## 2018-12-31 MED ORDER — PROMETHAZINE HCL 25 MG/ML IJ SOLN
INTRAMUSCULAR | Status: AC
  Filled 2018-12-31: qty 1

## 2018-12-31 MED ORDER — VANCOMYCIN HCL IN DEXTROSE 750-5 MG/150ML-% IV SOLN
750.0000 mg | Freq: Two times a day (BID) | INTRAVENOUS | Status: DC
  Administered 2018-12-31 – 2019-01-01 (×2): 750 mg via INTRAVENOUS
  Filled 2018-12-31 (×2): qty 150

## 2018-12-31 MED ORDER — BISACODYL 5 MG PO TBEC: 5.0000 mg | DELAYED_RELEASE_TABLET | Freq: Every day | ORAL | Status: DC | PRN

## 2018-12-31 MED ORDER — LACTATED RINGERS IV SOLN
INTRAVENOUS | Status: DC
  Administered 2018-12-31 (×3): via INTRAVENOUS

## 2018-12-31 MED ORDER — SUCCINYLCHOLINE CHLORIDE 20 MG/ML IJ SOLN
INTRAMUSCULAR | Status: DC | PRN
  Administered 2018-12-31: 120 mg via INTRAVENOUS

## 2018-12-31 MED ORDER — ACETAMINOPHEN 650 MG RE SUPP: 650.0000 mg | RECTAL | Status: DC | PRN

## 2018-12-31 MED ORDER — PROPOFOL 10 MG/ML IV BOLUS
INTRAVENOUS | Status: DC | PRN
  Administered 2018-12-31: 200 mg via INTRAVENOUS

## 2018-12-31 MED ORDER — ONDANSETRON HCL 4 MG/2ML IJ SOLN: 4.0000 mg | Freq: Four times a day (QID) | INTRAMUSCULAR | Status: DC | PRN

## 2018-12-31 MED ORDER — BUPIVACAINE LIPOSOME 1.3 % IJ SUSP
INTRAMUSCULAR | Status: DC | PRN
  Administered 2018-12-31: 20 mL

## 2018-12-31 MED ORDER — DEXAMETHASONE SODIUM PHOSPHATE 10 MG/ML IJ SOLN
INTRAMUSCULAR | Status: AC
  Filled 2018-12-31: qty 1

## 2018-12-31 MED ORDER — SODIUM CHLORIDE 0.9 % IV SOLN: 250.0000 mL | INTRAVENOUS | Status: DC

## 2018-12-31 MED ORDER — ONDANSETRON HCL 4 MG/2ML IJ SOLN
INTRAMUSCULAR | Status: DC | PRN
  Administered 2018-12-31: 4 mg via INTRAVENOUS

## 2018-12-31 MED ORDER — ROCURONIUM BROMIDE 10 MG/ML (PF) SYRINGE
PREFILLED_SYRINGE | INTRAVENOUS | Status: AC
  Filled 2018-12-31: qty 10

## 2018-12-31 MED ORDER — ONDANSETRON HCL 4 MG PO TABS: 4.0000 mg | ORAL_TABLET | Freq: Four times a day (QID) | ORAL | Status: DC | PRN

## 2018-12-31 MED ORDER — FENTANYL CITRATE (PF) 100 MCG/2ML IJ SOLN
25.0000 ug | INTRAMUSCULAR | Status: DC | PRN
  Administered 2018-12-31: 50 ug via INTRAVENOUS

## 2018-12-31 MED ORDER — BUPIVACAINE LIPOSOME 1.3 % IJ SUSP
20.0000 mL | INTRAMUSCULAR | Status: DC
  Filled 2018-12-31: qty 20

## 2018-12-31 MED ORDER — FENOFIBRATE 160 MG PO TABS
160.0000 mg | ORAL_TABLET | Freq: Every day | ORAL | Status: DC
  Administered 2018-12-31 – 2019-01-01 (×2): 160 mg via ORAL
  Filled 2018-12-31 (×2): qty 1

## 2018-12-31 MED ORDER — FENTANYL CITRATE (PF) 100 MCG/2ML IJ SOLN
INTRAMUSCULAR | Status: DC | PRN
  Administered 2018-12-31: 100 ug via INTRAVENOUS
  Administered 2018-12-31 (×5): 50 ug via INTRAVENOUS

## 2018-12-31 MED ORDER — TAMSULOSIN HCL 0.4 MG PO CAPS
0.4000 mg | ORAL_CAPSULE | Freq: Two times a day (BID) | ORAL | Status: DC
  Administered 2018-12-31 – 2019-01-01 (×2): 0.4 mg via ORAL
  Filled 2018-12-31 (×2): qty 1

## 2018-12-31 MED ORDER — PHENOL 1.4 % MT LIQD: 1.0000 | OROMUCOSAL | Status: DC | PRN

## 2018-12-31 MED ORDER — METHYLENE BLUE 0.5 % INJ SOLN
INTRAVENOUS | Status: AC
  Filled 2018-12-31: qty 10

## 2018-12-31 MED ORDER — ALBUMIN HUMAN 5 % IV SOLN
INTRAVENOUS | Status: DC | PRN
  Administered 2018-12-31: 12:00:00 via INTRAVENOUS

## 2018-12-31 MED ORDER — ACETAMINOPHEN 325 MG PO TABS: 650.0000 mg | ORAL_TABLET | ORAL | Status: DC | PRN

## 2018-12-31 MED ORDER — PROMETHAZINE HCL 25 MG/ML IJ SOLN
6.2500 mg | INTRAMUSCULAR | Status: DC | PRN
  Administered 2018-12-31: 6.25 mg via INTRAVENOUS

## 2018-12-31 MED ORDER — POVIDONE-IODINE 7.5 % EX SOLN
Freq: Once | CUTANEOUS | Status: DC
  Filled 2018-12-31: qty 118

## 2018-12-31 MED ORDER — OXYCODONE HCL 5 MG PO TABS
5.0000 mg | ORAL_TABLET | Freq: Once | ORAL | Status: AC | PRN
  Administered 2018-12-31: 5 mg via ORAL

## 2018-12-31 MED ORDER — DUTASTERIDE 0.5 MG PO CAPS
0.5000 mg | ORAL_CAPSULE | Freq: Every day | ORAL | Status: DC
  Administered 2019-01-01: 0.5 mg via ORAL
  Filled 2018-12-31: qty 1

## 2018-12-31 MED ORDER — OXYCODONE HCL 5 MG/5ML PO SOLN: 5.0000 mg | Freq: Once | ORAL | Status: AC | PRN

## 2018-12-31 MED ORDER — SENNOSIDES-DOCUSATE SODIUM 8.6-50 MG PO TABS: 1.0000 | ORAL_TABLET | Freq: Every evening | ORAL | Status: DC | PRN

## 2018-12-31 MED ORDER — ONDANSETRON HCL 4 MG/2ML IJ SOLN
INTRAMUSCULAR | Status: AC
  Filled 2018-12-31: qty 2

## 2018-12-31 MED ORDER — SCOPOLAMINE 1 MG/3DAYS TD PT72
MEDICATED_PATCH | TRANSDERMAL | Status: DC | PRN
  Administered 2018-12-31: 1 via TRANSDERMAL

## 2018-12-31 MED ORDER — BUPIVACAINE-EPINEPHRINE (PF) 0.25% -1:200000 IJ SOLN
INTRAMUSCULAR | Status: AC
  Filled 2018-12-31: qty 30

## 2018-12-31 MED ORDER — MIDAZOLAM HCL 5 MG/5ML IJ SOLN
INTRAMUSCULAR | Status: DC | PRN
  Administered 2018-12-31: 2 mg via INTRAVENOUS

## 2018-12-31 MED ORDER — ALUM & MAG HYDROXIDE-SIMETH 200-200-20 MG/5ML PO SUSP: 30.0000 mL | Freq: Four times a day (QID) | ORAL | Status: DC | PRN

## 2018-12-31 MED ORDER — MIDAZOLAM HCL 2 MG/2ML IJ SOLN
INTRAMUSCULAR | Status: AC
  Filled 2018-12-31: qty 2

## 2018-12-31 MED ORDER — ZOLPIDEM TARTRATE 5 MG PO TABS: 5.0000 mg | ORAL_TABLET | Freq: Every evening | ORAL | Status: DC | PRN

## 2018-12-31 MED ORDER — SODIUM CHLORIDE 0.9 % IV SOLN
INTRAVENOUS | Status: DC | PRN
  Administered 2018-12-31: 13:00:00 via INTRAVENOUS

## 2018-12-31 SURGICAL SUPPLY — 71 items
BENZOIN TINCTURE PRP APPL 2/3 (GAUZE/BANDAGES/DRESSINGS) IMPLANT
BUR PRECISION FLUTE 5.0 (BURR) ×2 IMPLANT
CABLE BIPOLOR RESECTION CORD (MISCELLANEOUS) IMPLANT
CANISTER SUCT 3000ML PPV (MISCELLANEOUS) ×2 IMPLANT
CARTRIDGE OIL MAESTRO DRILL (MISCELLANEOUS) ×1 IMPLANT
COVER SURGICAL LIGHT HANDLE (MISCELLANEOUS) ×2 IMPLANT
COVER WAND RF STERILE (DRAPES) ×2 IMPLANT
DIFFUSER DRILL AIR PNEUMATIC (MISCELLANEOUS) ×2 IMPLANT
DRAIN CHANNEL 15F RND FF W/TCR (WOUND CARE) ×2 IMPLANT
DRAPE POUCH INSTRU U-SHP 10X18 (DRAPES) ×4 IMPLANT
DRAPE SURG 17X23 STRL (DRAPES) ×8 IMPLANT
DURAPREP 26ML APPLICATOR (WOUND CARE) ×2 IMPLANT
ELECT BLADE 4.0 EZ CLEAN MEGAD (MISCELLANEOUS) ×2
ELECT CAUTERY BLADE 6.4 (BLADE) ×2 IMPLANT
ELECT REM PT RETURN 9FT ADLT (ELECTROSURGICAL) ×2
ELECTRODE BLDE 4.0 EZ CLN MEGD (MISCELLANEOUS) ×1 IMPLANT
ELECTRODE REM PT RTRN 9FT ADLT (ELECTROSURGICAL) ×1 IMPLANT
EVACUATOR SILICONE 100CC (DRAIN) ×2 IMPLANT
FILTER STRAW FLUID ASPIR (MISCELLANEOUS) ×2 IMPLANT
GAUZE 4X4 16PLY RFD (DISPOSABLE) ×4 IMPLANT
GAUZE SPONGE 4X4 12PLY STRL (GAUZE/BANDAGES/DRESSINGS) ×2 IMPLANT
GAUZE SPONGE 4X4 12PLY STRL LF (GAUZE/BANDAGES/DRESSINGS) ×2 IMPLANT
GLOVE BIO SURGEON STRL SZ7 (GLOVE) ×2 IMPLANT
GLOVE BIO SURGEON STRL SZ8 (GLOVE) ×2 IMPLANT
GLOVE BIOGEL PI IND STRL 7.0 (GLOVE) ×1 IMPLANT
GLOVE BIOGEL PI IND STRL 8 (GLOVE) ×1 IMPLANT
GLOVE BIOGEL PI INDICATOR 7.0 (GLOVE) ×1
GLOVE BIOGEL PI INDICATOR 8 (GLOVE) ×1
GOWN STRL REUS W/ TWL LRG LVL3 (GOWN DISPOSABLE) ×1 IMPLANT
GOWN STRL REUS W/ TWL XL LVL3 (GOWN DISPOSABLE) ×2 IMPLANT
GOWN STRL REUS W/TWL LRG LVL3 (GOWN DISPOSABLE) ×1
GOWN STRL REUS W/TWL XL LVL3 (GOWN DISPOSABLE) ×2
IV CATH 14GX2 1/4 (CATHETERS) ×2 IMPLANT
KIT BASIN OR (CUSTOM PROCEDURE TRAY) ×2 IMPLANT
KIT POSITION SURG JACKSON T1 (MISCELLANEOUS) ×2 IMPLANT
KIT TURNOVER KIT B (KITS) ×2 IMPLANT
NEEDLE 18GX1X1/2 (RX/OR ONLY) (NEEDLE) ×2 IMPLANT
NEEDLE 22X1 1/2 (OR ONLY) (NEEDLE) ×2 IMPLANT
NEEDLE HYPO 25GX1X1/2 BEV (NEEDLE) ×2 IMPLANT
NEEDLE SPNL 18GX3.5 QUINCKE PK (NEEDLE) ×4 IMPLANT
NS IRRIG 1000ML POUR BTL (IV SOLUTION) ×2 IMPLANT
OIL CARTRIDGE MAESTRO DRILL (MISCELLANEOUS) ×2
PACK LAMINECTOMY ORTHO (CUSTOM PROCEDURE TRAY) ×2 IMPLANT
PACK UNIVERSAL I (CUSTOM PROCEDURE TRAY) ×2 IMPLANT
PAD ARMBOARD 7.5X6 YLW CONV (MISCELLANEOUS) ×4 IMPLANT
PATTIES SURGICAL .5 X.5 (GAUZE/BANDAGES/DRESSINGS) IMPLANT
PATTIES SURGICAL .5 X1 (DISPOSABLE) ×2 IMPLANT
SPONGE INTESTINAL PEANUT (DISPOSABLE) ×2 IMPLANT
SPONGE LAP 4X18 RFD (DISPOSABLE) ×4 IMPLANT
SPONGE SURGIFOAM ABS GEL 100 (HEMOSTASIS) ×2 IMPLANT
SPONGE SURGIFOAM ABS GEL SZ50 (HEMOSTASIS) ×2 IMPLANT
STRIP CLOSURE SKIN 1/2X4 (GAUZE/BANDAGES/DRESSINGS) IMPLANT
SURGIFLO W/THROMBIN 8M KIT (HEMOSTASIS) ×2 IMPLANT
SUT ETHILON 2 0 PSLX (SUTURE) ×2 IMPLANT
SUT MNCRL AB 4-0 PS2 18 (SUTURE) ×2 IMPLANT
SUT VIC AB 0 CT1 18XCR BRD 8 (SUTURE) IMPLANT
SUT VIC AB 0 CT1 27 (SUTURE) ×1
SUT VIC AB 0 CT1 27XBRD ANBCTR (SUTURE) ×1 IMPLANT
SUT VIC AB 0 CT1 8-18 (SUTURE)
SUT VIC AB 1 CT1 18XCR BRD 8 (SUTURE) ×1 IMPLANT
SUT VIC AB 1 CT1 8-18 (SUTURE) ×1
SUT VIC AB 2-0 CT2 18 VCP726D (SUTURE) ×4 IMPLANT
SYR 20CC LL (SYRINGE) ×2 IMPLANT
SYR BULB IRRIGATION 50ML (SYRINGE) ×2 IMPLANT
SYR CONTROL 10ML LL (SYRINGE) ×4 IMPLANT
SYR TB 1ML 26GX3/8 SAFETY (SYRINGE) ×4 IMPLANT
SYR TB 1ML LUER SLIP (SYRINGE) ×4 IMPLANT
TOWEL GREEN STERILE (TOWEL DISPOSABLE) ×2 IMPLANT
TOWEL GREEN STERILE FF (TOWEL DISPOSABLE) ×2 IMPLANT
WATER STERILE IRR 1000ML POUR (IV SOLUTION) IMPLANT
YANKAUER SUCT BULB TIP NO VENT (SUCTIONS) ×2 IMPLANT

## 2018-12-31 NOTE — Anesthesia Procedure Notes (Signed)
Procedure Name: Intubation Date/Time: 12/31/2018 8:46 AM Performed by: Scheryl Darter, CRNA Pre-anesthesia Checklist: Patient identified, Emergency Drugs available, Suction available and Patient being monitored Patient Re-evaluated:Patient Re-evaluated prior to induction Oxygen Delivery Method: Circle System Utilized Preoxygenation: Pre-oxygenation with 100% oxygen Induction Type: IV induction Ventilation: Mask ventilation without difficulty Laryngoscope Size: Mac and 4 Grade View: Grade II Tube type: Oral Tube size: 7.5 mm Number of attempts: 1 Airway Equipment and Method: Stylet and Oral airway Placement Confirmation: ETT inserted through vocal cords under direct vision,  positive ETCO2 and breath sounds checked- equal and bilateral Secured at: 23 cm Tube secured with: Tape Dental Injury: Teeth and Oropharynx as per pre-operative assessment

## 2018-12-31 NOTE — Op Note (Signed)
PATIENT NAME: Richard Gross   MEDICAL RECORD NO.:   [id number]    PHYSICIAN:  Phylliss Bob, MD      DATE OF BIRTH: 1961-05-27  ASSISTANT: Pricilla Holm, PA-C  DATE OF PROCEDURE: 12/31/2018   OPERATIVE REPORT   PREOPERATIVE DIAGNOSES: 1. Bilateral leg pain. 2. Neurogenic claudication. 3. Spinal stenosis, L2/3, L3/4, L4/5.  POSTOPERATIVE DIAGNOSES: 1. Bilateral leg pain. 2. Neurogenic claudication. 3. Spinal stenosis, L2/3, L3/4, L4/5.  PROCEDURE:  L2/3, L3/4, L4/5 laminectomy with bilateral partial facetectomy and bilateral foraminotomy.  SURGEON:  Phylliss Bob, MD.  ASSISTANTPricilla Holm, PA-C.  ANESTHESIA:  General endotracheal anesthesia.  COMPLICATIONS:  None.  DISPOSITION:  Stable.  ESTIMATED BLOOD LOSS:  450cc with 200cc of cell saver retransfused  INDICATIONS FOR SURGERY:  Briefly, Richard Gross is a very pleasant 58 y.o.  year-old male, who did present to me with pain in the bilateral legs. The patient's MRI did reveal spinal stenosis at L2/3, L3/4 and L4/5.  We did proceed with appropriate conservative treatment, but the patient did continue to have ongoing pain, which he did feel was limiting his function substantially.  Given the patient's ongoing pain and dysfunction, we did discuss proceeding with the procedure reflected above.  The patient was fully aware of the risks and limitations of surgery and did wish to proceed.  OPERATIVE DETAILS:  On 12/31/2018, the patient was brought to surgery and general endotracheal anesthesia was administered.  The patient was placed prone on a well-padded flat Jackson bed with a spinal frame. Antibiotics were given and the back was prepped and draped in the usual sterile fashion.  A time-out procedure was performed.  I then made a midline incision overlying the L2/3 L3/4 and L4/5 intervertebral spaces.  The fascia was incised at the midline.  The paraspinal musculature was bluntly retracted laterally and held retracted  with a self-retaining retractor. After confirming the appropriate operative level, I did remove the spinous process of L2, L3 and L4.  At this point, I proceeded with a partial facetectomy on the right and left sides.  Of note, there was  significant overgrowth of the facet joints bilaterally, and there was also substantial hypertrophy of the ligamentum flavum.  The lateral recess stenosis was addressed by using Kerrison punches to thoroughly and decompress the right and left lateral recess.  At this point, I carried the decompression up to the L3-4 level.  Once again, a partial facetectomy was performed bilaterally, and I was able to thoroughly and completely decompress the right and left lateral recess at the L3-4 level.  I then carried the decompression up to the L2/3 level.  Once again, a partial facetectomy was performed bilaterally, and I was able to thoroughly and completely decompress the right and left lateral recess at L2/3.  I then performed foraminotomies bilaterally at L2/3, L3/4, and then L4/5.  At the termination of the decompression, I was able to easily pass a [facility] out the neuroforamen on the right and left sides at L2/3, L3/4 and L4/5.  The spinal canal was entirely decompressed.  All bleeding was then adequately controlled.  At this point, 40 mg of Depo-Medrol was introduced about the epidural space.  Prior to this, the wound was copiously irrigated with a total of approximately 2 L of normal saline. Gelfoam was placed over the laminectomy site.  I was very pleased with the decompression.  There was no extravasation of cerebrospinal fluid noted throughout the entire surgery. A #15 deep blake drain  was placed.  At this point, the wound was closed in layers using #1 Vicryl, followed by 2-0 Vicryl, followed by 4- 0 Monocryl.  Benzoin and Steri-Strips were applied, followed by a sterile dressing.  All instrument counts were correct at the termination of the  procedure.  Of note, Pricilla Holm, PA-C, was my assistant throughout surgery, and did aid in retraction, suctioning, and closure from start to finish.     Phylliss Bob, MD

## 2018-12-31 NOTE — Transfer of Care (Signed)
Immediate Anesthesia Transfer of Care Note  Patient: Richard Gross  Procedure(s) Performed: LUMBAR 2 - LUMBAR 5 DECOMPRESSION (N/A )  Patient Location: PACU  Anesthesia Type:General  Level of Consciousness: awake, alert , oriented and sedated  Airway & Oxygen Therapy: Patient Spontanous Breathing and Patient connected to nasal cannula oxygen  Post-op Assessment: Report given to RN, Post -op Vital signs reviewed and stable and Patient moving all extremities  Post vital signs: Reviewed and stable  Last Vitals:  Vitals Value Taken Time  BP 132/71 12/31/18 1335  Temp 36.2 C 12/31/18 1335  Pulse 97 12/31/18 1337  Resp 16 12/31/18 1337  SpO2 97 % 12/31/18 1337  Vitals shown include unvalidated device data.  Last Pain:  Vitals:   12/31/18 0731  TempSrc:   PainSc: 2       Patients Stated Pain Goal: 2 (93/24/19 9144)  Complications: No apparent anesthesia complications

## 2018-12-31 NOTE — H&P (Signed)
PREOPERATIVE H&P  Chief Complaint: Bilateral leg pain  HPI: Richard Gross is a 58 y.o. male who presents with ongoing pain in the bilateral legs x many years  MRI reveals spinal stenosis spanning L2-L5  Patient has failed multiple forms of conservative care and continues to have pain (see office notes for additional details regarding the patient's full course of treatment)  Past Medical History:  Diagnosis Date  . History of kidney stones    Past Surgical History:  Procedure Laterality Date  . TONSILLECTOMY     Social History   Socioeconomic History  . Marital status: Married    Spouse name: Not on file  . Number of children: Not on file  . Years of education: Not on file  . Highest education level: Not on file  Occupational History  . Not on file  Social Needs  . Financial resource strain: Not on file  . Food insecurity    Worry: Not on file    Inability: Not on file  . Transportation needs    Medical: Not on file    Non-medical: Not on file  Tobacco Use  . Smoking status: Former Smoker    Quit date: 07/17/1995    Years since quitting: 23.4  . Smokeless tobacco: Never Used  Substance and Sexual Activity  . Alcohol use: No  . Drug use: No  . Sexual activity: Yes    Birth control/protection: None  Lifestyle  . Physical activity    Days per week: Not on file    Minutes per session: Not on file  . Stress: Not on file  Relationships  . Social Herbalist on phone: Not on file    Gets together: Not on file    Attends religious service: Not on file    Active member of club or organization: Not on file    Attends meetings of clubs or organizations: Not on file    Relationship status: Not on file  Other Topics Concern  . Not on file  Social History Narrative  . Not on file   Family History  Problem Relation Age of Onset  . Cancer Mother   . Cancer Father   . Heart disease Sister   . Diabetes Sister   . Hyperlipidemia Sister   . Hypertension  Sister   . Heart disease Brother   . Diabetes Brother   . Hyperlipidemia Brother   . Hypertension Brother    Allergies  Allergen Reactions  . Amoxicillin Shortness Of Breath and Other (See Comments)    Flushing Did it involve swelling of the face/tongue/throat, SOB, or low BP? Yes Did it involve sudden or severe rash/hives, skin peeling, or any reaction on the inside of your mouth or nose? No Did you need to seek medical attention at a hospital or doctor's office? Yes When did it last happen?8 years ago If all above answers are "NO", may proceed with cephalosporin use.  . Statins Other (See Comments)    Made pt feel loopy   Prior to Admission medications   Medication Sig Start Date End Date Taking? Authorizing Provider  dutasteride (AVODART) 0.5 MG capsule TAKE 1 CAPSULE (0.5 MG TOTAL) BY MOUTH DAILY. 08/19/18  Yes Silverio Decamp, MD  etodolac (LODINE XL) 600 MG 24 hr tablet Take 1 tablet (600 mg total) by mouth daily. 10/31/18  Yes Silverio Decamp, MD  fenofibrate 160 MG tablet TAKE 1 TABLET BY MOUTH EVERY DAY Patient taking differently:  Take 160 mg by mouth daily.  09/27/18  Yes Silverio Decamp, MD  gabapentin (NEURONTIN) 600 MG tablet TAKE 1 TABLET BY MOUTH TWICE A DAY Patient taking differently: Take 600 mg by mouth 2 (two) times daily.  09/27/18  Yes Silverio Decamp, MD  Multiple Vitamin (MULTIVITAMIN) tablet Take 1 tablet by mouth daily.   Yes [provider]  omega-3 acid ethyl esters (LOVAZA) 1 g capsule TAKE 2 CAPSULES (2 G TOTAL) BY MOUTH 2 (TWO) TIMES DAILY. 04/07/18  Yes Silverio Decamp, MD  sildenafil (REVATIO) 20 MG tablet Take 1-5 tablets (20-100 mg total) by mouth as needed. Patient taking differently: Take 20-100 mg by mouth daily as needed (erectile dysfunction).  08/16/17  Yes Silverio Decamp, MD  tamsulosin (FLOMAX) 0.4 MG CAPS capsule TAKE 1 CAPSULE BY MOUTH TWICE A DAY Patient taking differently: Take 0.4 mg by mouth 2  (two) times a day.  09/04/18  Yes Silverio Decamp, MD  topiramate (TOPAMAX) 50 MG tablet TAKE 1 TABLET BY MOUTH EVERYDAY AT BEDTIME Patient taking differently: Take 50 mg by mouth at bedtime.  09/27/18  Yes Silverio Decamp, MD  Evolocumab (REPATHA SURECLICK) 078 MG/ML SOAJ Inject 1 Dose into the skin every 14 (fourteen) days. Patient not taking: Reported on 12/24/2018 10/31/18   Silverio Decamp, MD     All other systems have been reviewed and were otherwise negative with the exception of those mentioned in the HPI and as above.  Physical Exam: Vitals:   12/31/18 0644  BP: (!) 144/84  Pulse: 87  Resp: 20  Temp: 98.4 F (36.9 C)  SpO2: 98%    Body mass index is 36.21 kg/m.  General: Alert, no acute distress Cardiovascular: No pedal edema Respiratory: No cyanosis, no use of accessory musculature Skin: No lesions in the area of chief complaint Neurologic: Sensation intact distally Psychiatric: Patient is competent for consent with normal mood and affect Lymphatic: No axillary or cervical lymphadenopathy   Assessment/Plan: MULTILEVEL SPINAL STENOSIS LUMBAR 2 - LUMBAR 5 RESULTING IN [place] for Procedure(s): LUMBAR 2 - LUMBAR 5 DECOMPRESSION   Norva Karvonen, MD 12/31/2018 8:23 AM

## 2018-12-31 NOTE — Plan of Care (Signed)
Patient admitted and in stable condition.

## 2018-12-31 NOTE — Progress Notes (Signed)
Pharmacy Antibiotic Note  Richard Gross is a 58 y.o. male admitted on 12/31/2018 with surgical prophylaxis.  Pharmacy has been consulted for vancomycin dosing.  Underwent laminectomy on 6/17, with surgical drain remaining in place. Received pre-op vanc dose on 6/17@0751 . Scr 1.06 (CrCl 76 mL/min).   Plan: Vancomycin 750 mg IV every 12 hours  Monitor renal fx, clinical pic, cx results, and vanc levels as appropriate  Height: 5' 9.5" (176.5 cm) Weight: 248 lb 12.8 oz (112.9 kg) IBW/kg (Calculated) : 71.85  Temp (24hrs), Avg:97.8 F (36.6 C), Min:97.2 F (36.2 C), Max:98.4 F (36.9 C)  Recent Labs  Lab 12/29/18 1340  WBC 5.8  CREATININE 1.06    Estimated Creatinine Clearance: 93.7 mL/min (by C-G formula based on SCr of 1.06 mg/dL).    Allergies  Allergen Reactions  . Amoxicillin Shortness Of Breath and Other (See Comments)    Flushing Did it involve swelling of the face/tongue/throat, SOB, or low BP? Yes Did it involve sudden or severe rash/hives, skin peeling, or any reaction on the inside of your mouth or nose? No Did you need to seek medical attention at a hospital or doctor's office? Yes When did it last happen?8 years ago If all above answers are "NO", may proceed with cephalosporin use.  . Statins Other (See Comments)    Made pt feel loopy    Thank you for allowing pharmacy to be a part of this patient's care.  Antonietta Jewel, PharmD, [place] Clinical Pharmacist  Pager: [phone] Phone: [phone] 12/31/2018 3:06 PM

## 2019-01-01 ENCOUNTER — Encounter (HOSPITAL_COMMUNITY): Payer: Self-pay | Admitting: Orthopedic Surgery

## 2019-01-01 DIAGNOSIS — M48062 Spinal stenosis, lumbar region with neurogenic claudication: Secondary | ICD-10-CM | POA: Diagnosis not present

## 2019-01-01 DIAGNOSIS — Z87891 Personal history of nicotine dependence: Secondary | ICD-10-CM | POA: Diagnosis not present

## 2019-01-01 DIAGNOSIS — Z79899 Other long term (current) drug therapy: Secondary | ICD-10-CM | POA: Diagnosis not present

## 2019-01-01 DIAGNOSIS — E669 Obesity, unspecified: Secondary | ICD-10-CM | POA: Diagnosis not present

## 2019-01-01 DIAGNOSIS — Z6836 Body mass index (BMI) 36.0-36.9, adult: Secondary | ICD-10-CM | POA: Diagnosis not present

## 2019-01-01 MED ORDER — OXYCODONE-ACETAMINOPHEN 5-325 MG PO TABS: 1.0000 | ORAL_TABLET | ORAL | 0 refills | Status: DC | PRN

## 2019-01-01 MED ORDER — DIAZEPAM 5 MG PO TABS: 5.0000 mg | ORAL_TABLET | Freq: Four times a day (QID) | ORAL | 0 refills | Status: DC | PRN

## 2019-01-01 NOTE — Progress Notes (Signed)
    Patient doing well  Has been ambulating   Physical Exam: Vitals:   12/31/18 2332 01/01/19 0433  BP: 118/75 114/65  Pulse: 88 85  Resp: 20 20  Temp: 98.5 F (36.9 C) 98.8 F (37.1 C)  SpO2: 97% 94%    Dressing in place NVI  Drain output: 180cc overnight  POD #1 s/p L2-L5 decompressioin, doing well  - encourage ambulation - Percocet for pain, Valium for muscle spasms - may d/c home later today with f/u in 2 weeks depending on drain output

## 2019-01-01 NOTE — Anesthesia Postprocedure Evaluation (Signed)
Anesthesia Post Note  Patient: Adult nurse  Procedure(s) Performed: LUMBAR 2 - LUMBAR 5 DECOMPRESSION (N/A )     Patient location during evaluation: PACU Anesthesia Type: General Level of consciousness: awake and alert Pain management: pain level controlled Vital Signs Assessment: post-procedure vital signs reviewed and stable Respiratory status: spontaneous breathing, nonlabored ventilation, respiratory function stable and patient connected to nasal cannula oxygen Cardiovascular status: blood pressure returned to baseline and stable Postop Assessment: no apparent nausea or vomiting Anesthetic complications: no    Last Vitals:  Vitals:   01/01/19 0433 01/01/19 0810  BP: 114/65 137/82  Pulse: 85 92  Resp: 20 20  Temp: 37.1 C 36.8 C  SpO2: 94% 97%    Last Pain:  Vitals:   01/01/19 0810  TempSrc: Oral  PainSc:                  Effie Berkshire

## 2019-01-01 NOTE — Plan of Care (Signed)
Patient alert and oriented, mae's well, voiding adequate amount of urine, swallowing without difficulty, no c/o pain at time of discharge. Patient discharged home with family. Script and discharged instructions given to patient. Patient and family stated understanding of instructions given. Patient has an appointment with Dr. Dumonski 

## 2019-01-01 NOTE — Plan of Care (Signed)
Uneventful night. Mobilized. Pain well controlled.

## 2019-01-02 ENCOUNTER — Telehealth: Payer: BC Managed Care – PPO

## 2019-01-05 ENCOUNTER — Ambulatory Visit (INDEPENDENT_AMBULATORY_CARE_PROVIDER_SITE_OTHER): Payer: BC Managed Care – PPO | Admitting: Sports Medicine

## 2019-01-05 DIAGNOSIS — R21 Rash and other nonspecific skin eruption: Secondary | ICD-10-CM | POA: Insufficient documentation

## 2019-01-05 MED ORDER — TRIAMCINOLONE ACETONIDE 0.05 % EX OINT: TOPICAL_OINTMENT | CUTANEOUS | 0 refills | Status: DC

## 2019-01-05 MED ORDER — DOXYCYCLINE HYCLATE 100 MG PO TABS: 100.0000 mg | ORAL_TABLET | Freq: Two times a day (BID) | ORAL | 0 refills | Status: AC

## 2019-01-05 NOTE — Assessment & Plan Note (Signed)
Unclear allergy, possibly adhesive reaction, he did recently have a lumbar decompression. Also some concern for MRSA folliculitis. This was scheduled in a virtual visit slot so unfortunately I was not really able to see the rash, adding doxycycline, low potency topical triamcinolone. Return to see me in a week if no better.

## 2019-01-05 NOTE — Progress Notes (Signed)
Virtual Visit via WebEx/MyChart   I connected with  Richard Gross  on 01/05/19 via WebEx/MyChart/Doximity Video and verified that I am speaking with the correct person using two identifiers.   I discussed the limitations, risks, security and privacy concerns of performing an evaluation and management service by WebEx/MyChart/Doximity Video, including the higher likelihood of inaccurate diagnosis and treatment, and the availability of in person appointments.  We also discussed the likely need of an additional face to face encounter for complete and high quality delivery of care.  I also discussed with the patient that there may be a patient responsible charge related to this service. The patient expressed understanding and wishes to proceed.  Provider location is either at home or medical facility. Patient location is at their home, different from provider location. People involved in care of the patient during this telehealth encounter were myself, my nurse/medical assistant, and my front office/scheduling team member.  Subjective:    CC: Facial rash  HPI: Richard Gross recently had a L2-L5 lumbar decompression, he is doing extremely well from his back standpoint.  Unfortunately after surgery he developed a rash on his face, forehead, minimally itchy, minimally painful.  I reviewed the past medical history, family history, social history, surgical history, and allergies today and no changes were needed.  Please see the problem list section below in epic for further details.  Past Medical History: Past Medical History:  Diagnosis Date  . History of kidney stones    Past Surgical History: Past Surgical History:  Procedure Laterality Date  . LUMBAR LAMINECTOMY/DECOMPRESSION MICRODISCECTOMY N/A 12/31/2018   Procedure: LUMBAR 2 - LUMBAR 5 DECOMPRESSION;  Surgeon: Phylliss Bob, MD;  Location: [place];  Service: Orthopedics;  Laterality: N/A;  . TONSILLECTOMY     Social History: Social History    Socioeconomic History  . Marital status: Married    Spouse name: Not on file  . Number of children: Not on file  . Years of education: Not on file  . Highest education level: Not on file  Occupational History  . Not on file  Social Needs  . Financial resource strain: Not on file  . Food insecurity    Worry: Not on file    Inability: Not on file  . Transportation needs    Medical: Not on file    Non-medical: Not on file  Tobacco Use  . Smoking status: Former Smoker    Quit date: 07/17/1995    Years since quitting: 23.4  . Smokeless tobacco: Never Used  Substance and Sexual Activity  . Alcohol use: No  . Drug use: No  . Sexual activity: Yes    Birth control/protection: None  Lifestyle  . Physical activity    Days per week: Not on file    Minutes per session: Not on file  . Stress: Not on file  Relationships  . Social Herbalist on phone: Not on file    Gets together: Not on file    Attends religious service: Not on file    Active member of club or organization: Not on file    Attends meetings of clubs or organizations: Not on file    Relationship status: Not on file  Other Topics Concern  . Not on file  Social History Narrative  . Not on file   Family History: Family History  Problem Relation Age of Onset  . Cancer Mother   . Cancer Father   . Heart disease Sister   .  Diabetes Sister   . Hyperlipidemia Sister   . Hypertension Sister   . Heart disease Brother   . Diabetes Brother   . Hyperlipidemia Brother   . Hypertension Brother    Allergies: Allergies  Allergen Reactions  . Amoxicillin Shortness Of Breath and Other (See Comments)    Flushing Did it involve swelling of the face/tongue/throat, SOB, or low BP? Yes Did it involve sudden or severe rash/hives, skin peeling, or any reaction on the inside of your mouth or nose? No Did you need to seek medical attention at a hospital or doctor's office? Yes When did it last happen?8 years ago If all  above answers are "NO", may proceed with cephalosporin use.  . Statins Other (See Comments)    Made pt feel loopy   Medications: See med rec.  Review of Systems: No fevers, chills, night sweats, weight loss, chest pain, or shortness of breath.   Objective:    General: Speaking full sentences, no audible heavy breathing.  Sounds alert and appropriately interactive.  Appears well.  Face symmetric.  Extraocular movements intact.  Pupils equal and round.  No nasal flaring or accessory muscle use visualized.  No other physical exam performed due to the non-physical nature of this visit.  Impression and Recommendations:    Skin rash Unclear allergy, possibly adhesive reaction, he did recently have a lumbar decompression. Also some concern for MRSA folliculitis. This was scheduled in a virtual visit slot so unfortunately I was not really able to see the rash, adding doxycycline, low potency topical triamcinolone. Return to see me in a week if no better.  I discussed the above assessment and treatment plan with the patient. The patient was provided an opportunity to ask questions and all were answered. The patient agreed with the plan and demonstrated an understanding of the instructions.   The patient was advised to call back or seek an in-person evaluation if the symptoms worsen or if the condition fails to improve as anticipated.   I provided 25 minutes of non-face-to-face time during this encounter, 15 minutes of additional time was needed to gather information, review chart, records, communicate/coordinate with staff remotely, troubleshooting the multiple errors that we get every time when trying to do video calls through the electronic medical record, WebEx, and Doximity, restart the encounter multiple times due to instability of the software, as well as complete documentation.   ___________________________________________ [name]. Dianah Field, M.D., ABFM., CAQSM. Primary Care and Sports  Medicine [facility]  Adjunct Professor of [place] [employer] [facility]

## 2019-01-06 MED FILL — Heparin Sodium (Porcine) Inj 1000 Unit/ML: INTRAMUSCULAR | Qty: 30 | Status: AC

## 2019-01-06 MED FILL — Sodium Chloride IV Soln 0.9%: INTRAVENOUS | Qty: 1000 | Status: AC

## 2019-01-07 ENCOUNTER — Encounter: Payer: Self-pay | Admitting: Sports Medicine

## 2019-01-09 DIAGNOSIS — M48062 Spinal stenosis, lumbar region with neurogenic claudication: Secondary | ICD-10-CM | POA: Diagnosis not present

## 2019-01-29 NOTE — Discharge Summary (Signed)
Patient ID: Richard Gross MRN: [id number] DOB/AGE: 1960/12/12 58 y.o.  Admit date: 12/31/2018 Discharge date: 01/01/2019  Admission Diagnoses:  Active Problems:   Spinal stenosis   Discharge Diagnoses:  Same  Past Medical History:  Diagnosis Date  . History of kidney stones     Surgeries: Procedure(s): LUMBAR 2 - LUMBAR 5 DECOMPRESSION on 12/31/2018   Consultants: None  Discharged Condition: Improved  Hospital Course: Richard Gross is an 58 y.o. male who was admitted 12/31/2018 for operative treatment of Spinal Stenosis. Patient has severe unremitting pain that affects sleep, daily activities, and work/hobbies. After pre-op clearance the patient was taken to the operating room on 12/31/2018 and underwent  Procedure(s): LUMBAR 2 - LUMBAR 5 DECOMPRESSION.    Patient was given perioperative antibiotics:  Anti-infectives (From admission, onward)   Start     Dose/Rate Route Frequency Ordered Stop   12/31/18 2000  vancomycin (VANCOCIN) IVPB 750 mg/150 ml premix  Status:  Discontinued     750 mg 150 mL/hr over 60 Minutes Intravenous Every 12 hours 12/31/18 1511 01/01/19 1702   12/31/18 0700  vancomycin (VANCOCIN) 1,500 mg in sodium chloride 0.9 % 500 mL IVPB     1,500 mg 250 mL/hr over 120 Minutes Intravenous To ShortStay [facility] 12/30/18 1153 12/31/18 1532       Patient was given sequential compression devices, early ambulation to prevent DVT.  Patient benefited maximally from hospital stay and there were no complications.    Recent vital signs: BP 137/82 (BP Location: Left Arm)   Pulse 92   Temp 98.3 F (36.8 C) (Oral)   Resp 20   Ht 5' 9.5" (1.765 m)   Wt 112.9 kg   SpO2 97%   BMI 36.21 kg/m    Discharge Medications:   Allergies as of 01/01/2019      Reactions   Amoxicillin Shortness Of Breath, Other (See Comments)   Flushing Did it involve swelling of the face/tongue/throat, SOB, or low BP? Yes Did it involve sudden or severe rash/hives, skin peeling, or any  reaction on the inside of your mouth or nose? No Did you need to seek medical attention at a hospital or doctor's office? Yes When did it last happen?8 years ago If all above answers are "NO", may proceed with cephalosporin use.   Statins Other (See Comments)   Made pt feel loopy      Medication List    TAKE these medications   diazepam 5 MG tablet Commonly known as: VALIUM Take 1 tablet (5 mg total) by mouth every 6 (six) hours as needed for muscle spasms.   dutasteride 0.5 MG capsule Commonly known as: AVODART TAKE 1 CAPSULE (0.5 MG TOTAL) BY MOUTH DAILY.   Evolocumab 140 MG/ML Soaj Commonly known as: Repatha SureClick Inject 1 Dose into the skin every 14 (fourteen) days.   fenofibrate 160 MG tablet TAKE 1 TABLET BY MOUTH EVERY DAY   gabapentin 600 MG tablet Commonly known as: NEURONTIN TAKE 1 TABLET BY MOUTH TWICE A DAY   multivitamin tablet Take 1 tablet by mouth daily.   oxyCODONE-acetaminophen 5-325 MG tablet Commonly known as: PERCOCET/ROXICET Take 1-2 tablets by mouth every 4 (four) hours as needed for moderate pain or severe pain.   sildenafil 20 MG tablet Commonly known as: REVATIO Take 1-5 tablets (20-100 mg total) by mouth as needed. What changed:   when to take this  reasons to take this   tamsulosin 0.4 MG Caps capsule Commonly known as: FLOMAX TAKE 1  CAPSULE BY MOUTH TWICE A DAY What changed: when to take this   topiramate 50 MG tablet Commonly known as: TOPAMAX TAKE 1 TABLET BY MOUTH EVERYDAY AT BEDTIME What changed:   how much to take  how to take this  when to take this  additional instructions       Diagnostic Studies: Dg Lumbar Spine 2-3 Views  Result Date: 12/31/2018 CLINICAL DATA:  L2-L5 decompression. EXAM: LUMBAR SPINE - 2-3 VIEW COMPARISON:  MRI 11/24/2018 FINDINGS: Two lateral intraoperative views demonstrate normal vertebral body alignment and heights. There is moderate spondylosis of the lumbar spine with mild disc  space narrowing throughout the lumbar spine. Initial film demonstrates surgical instrument with tip over the posterior elements at the L3-4 level with subsequent film demonstrating a metallic structure over the posterior soft tissues at the L2-3 level and more inferior metallic structure over the posterior soft tissues at the L5 level. IMPRESSION: Metallic instrument localization as described. Moderate spondylosis of the lumbar spine with mild multilevel disc disease. Electronically Signed   By: Marin Olp M.D.   On: 12/31/2018 12:02    Disposition:    POD #1 s/p L2-L5 decompressioin, doing well  - encourage ambulation - Percocet for pain, Valium for muscle spasms -D/C instructions sheet printed and in chart -D/C today  -F/U in office 2 weeks   Signed: Justice Britain 01/29/2019, 10:51 AM

## 2019-02-08 ENCOUNTER — Other Ambulatory Visit: Payer: Self-pay | Admitting: Sports Medicine

## 2019-02-08 DIAGNOSIS — M5416 Radiculopathy, lumbar region: Secondary | ICD-10-CM

## 2019-02-12 ENCOUNTER — Telehealth: Payer: Self-pay | Admitting: Sports Medicine

## 2019-02-12 DIAGNOSIS — E783 Hyperchylomicronemia: Secondary | ICD-10-CM

## 2019-02-12 DIAGNOSIS — E291 Testicular hypofunction: Secondary | ICD-10-CM

## 2019-02-12 NOTE — Telephone Encounter (Signed)
Patient states he wants his normal lab work ordered prior to appointment on 08/11 so he can discuss it on that appointment with PCP. Please contact and advise once ready so he can come and get it done.

## 2019-02-13 DIAGNOSIS — M4807 Spinal stenosis, lumbosacral region: Secondary | ICD-10-CM | POA: Diagnosis not present

## 2019-02-13 NOTE — Telephone Encounter (Signed)
Orders placed.

## 2019-02-13 NOTE — Addendum Note (Signed)
Addended by: Silverio Decamp on: 02/13/2019 03:33 PM   Modules accepted: Orders

## 2019-02-19 ENCOUNTER — Other Ambulatory Visit: Payer: Self-pay

## 2019-02-19 ENCOUNTER — Ambulatory Visit (INDEPENDENT_AMBULATORY_CARE_PROVIDER_SITE_OTHER): Payer: BC Managed Care – PPO | Admitting: Sports Medicine

## 2019-02-19 DIAGNOSIS — M9261 Juvenile osteochondrosis of tarsus, right ankle: Secondary | ICD-10-CM | POA: Diagnosis not present

## 2019-02-19 DIAGNOSIS — M9262 Juvenile osteochondrosis of tarsus, left ankle: Secondary | ICD-10-CM

## 2019-02-19 DIAGNOSIS — N139 Obstructive and reflux uropathy, unspecified: Secondary | ICD-10-CM

## 2019-02-19 DIAGNOSIS — N529 Male erectile dysfunction, unspecified: Secondary | ICD-10-CM

## 2019-02-19 DIAGNOSIS — N6320 Unspecified lump in the left breast, unspecified quadrant: Secondary | ICD-10-CM

## 2019-02-19 DIAGNOSIS — E783 Hyperchylomicronemia: Secondary | ICD-10-CM

## 2019-02-19 NOTE — Patient Instructions (Signed)
Begin with easy walking, heel, toe and backwards  Do calf raises on a step:  First lower and then raise on 1 foot  If this is painful, lower on 1 foot, do the heel raise on both feet Begin with 3 sets of 10 repetitions  Increase by 5 repetitions every 3 days  Goal is 3 sets of 30 repetitions  Do with both straight knee and knee at 20 degrees of flexion  If pain persists, once you can do 3 sets of 30 without weight, add backpack with 5 lbs.  Increase by 5 lbs per week to max of 30 lbs for 3 sets of 15   

## 2019-02-19 NOTE — Assessment & Plan Note (Signed)
Over-the-counter NSAIDs, heel lifts in all of his shoes, Achilles rehabilitation. Return in 1 month for this.

## 2019-02-19 NOTE — Assessment & Plan Note (Signed)
Tender, swollen breast glandular tissue on the left, subareolar. Stopping Avodart as this can result in breast glandular swelling, I would however like some imaging in the form of a diagnostic mammogram and ultrasound.

## 2019-02-19 NOTE — Assessment & Plan Note (Signed)
Initial good response to sildenafil, he is having some significant erectile dysfunction, even with masturbation, we are going to discontinue Avodart for now.

## 2019-02-19 NOTE — Progress Notes (Signed)
Subjective:    CC: Multiple issues  HPI: Obstructive uropathy: Good improvement in urinary symptoms with Avodart and Flomax however he is developing some retrograde ejaculation, erectile dysfunction.  Breast mass: Also has noted an increasing pain, swelling under his left areola since starting Avodart.  No nipple discharge, no trauma, no history of breast cancer in himself or his family.  Heel pain: Bilateral, worse with prolonged weightbearing.  I reviewed the past medical history, family history, social history, surgical history, and allergies today and no changes were needed.  Please see the problem list section below in epic for further details.  Past Medical History: Past Medical History:  Diagnosis Date  . History of kidney stones    Past Surgical History: Past Surgical History:  Procedure Laterality Date  . LUMBAR LAMINECTOMY/DECOMPRESSION MICRODISCECTOMY N/A 12/31/2018   Procedure: LUMBAR 2 - LUMBAR 5 DECOMPRESSION;  Surgeon: Phylliss Bob, MD;  Location: [place];  Service: Orthopedics;  Laterality: N/A;  . TONSILLECTOMY     Social History: Social History   Socioeconomic History  . Marital status: Married    Spouse name: Not on file  . Number of children: Not on file  . Years of education: Not on file  . Highest education level: Not on file  Occupational History  . Not on file  Social Needs  . Financial resource strain: Not on file  . Food insecurity    Worry: Not on file    Inability: Not on file  . Transportation needs    Medical: Not on file    Non-medical: Not on file  Tobacco Use  . Smoking status: Former Smoker    Quit date: 07/17/1995    Years since quitting: 23.6  . Smokeless tobacco: Never Used  Substance and Sexual Activity  . Alcohol use: No  . Drug use: No  . Sexual activity: Yes    Birth control/protection: None  Lifestyle  . Physical activity    Days per week: Not on file    Minutes per session: Not on file  . Stress: Not on file   Relationships  . Social Herbalist on phone: Not on file    Gets together: Not on file    Attends religious service: Not on file    Active member of club or organization: Not on file    Attends meetings of clubs or organizations: Not on file    Relationship status: Not on file  Other Topics Concern  . Not on file  Social History Narrative  . Not on file   Family History: Family History  Problem Relation Age of Onset  . Cancer Mother   . Cancer Father   . Heart disease Sister   . Diabetes Sister   . Hyperlipidemia Sister   . Hypertension Sister   . Heart disease Brother   . Diabetes Brother   . Hyperlipidemia Brother   . Hypertension Brother    Allergies: Allergies  Allergen Reactions  . Amoxicillin Shortness Of Breath and Other (See Comments)    Flushing Did it involve swelling of the face/tongue/throat, SOB, or low BP? Yes Did it involve sudden or severe rash/hives, skin peeling, or any reaction on the inside of your mouth or nose? No Did you need to seek medical attention at a hospital or doctor's office? Yes When did it last happen?8 years ago If all above answers are "NO", may proceed with cephalosporin use.  . Statins Other (See Comments)    Made pt feel  loopy   Medications: See med rec.  Review of Systems: No fevers, chills, night sweats, weight loss, chest pain, or shortness of breath.   Objective:    General: Well Developed, well nourished, and in no acute distress.  Neuro: Alert and oriented x3, extra-ocular muscles intact, sensation grossly intact.  HEENT: Normocephalic, atraumatic, pupils equal round reactive to light, neck supple, no masses, no lymphadenopathy, thyroid nonpalpable.  Skin: Warm and dry, no rashes. Cardiac: Regular rate and rhythm, no murmurs rubs or gallops, no lower extremity edema.  Respiratory: Clear to auscultation bilaterally. Not using accessory muscles, speaking in full sentences. Breasts: There is tender, slightly  full breast glandular tissue in the subareolar region on the left, nothing extending into the tail of Spence and no axillary adenopathy. Bilateral ankles: No visible erythema or swelling. Range of motion is full in all directions. Strength is 5/5 in all directions. Stable lateral and medial ligaments; squeeze test and kleiger test unremarkable; Talar dome nontender; No pain at base of 5th MT; No tenderness over cuboid; No tenderness over N spot or navicular prominence No tenderness on posterior aspects of lateral and medial malleolus No sign of peroneal tendon subluxations; Negative tarsal tunnel tinel's Bilateral tender Haglund's deformities  Impression and Recommendations:    Hyperlipidemia Persistently elevated lipids on max dose fenofibrate, Lovaza, intolerable to statins. He has not yet started Repatha, we are going to recheck his lipids, if still elevated he will switch to [place].  Obstructive uropathy Urinary symptoms improved considerably with Avodart and Flomax however he is getting some retrograde ejaculation which we discussed was normal with Flomax. Unfortunately he has developed some left breast glandular swelling, as well as erectile dysfunction, I think this is related to his Avodart so we will discontinue this.  Mass of breast, left Tender, swollen breast glandular tissue on the left, subareolar. Stopping Avodart as this can result in breast glandular swelling, I would however like some imaging in the form of a diagnostic mammogram and ultrasound.  Haglund's deformity of both heels Over-the-counter NSAIDs, heel lifts in all of his shoes, Achilles rehabilitation. Return in 1 month for this.  Erectile dysfunction Initial good response to sildenafil, he is having some significant erectile dysfunction, even with masturbation, we are going to discontinue Avodart for now.   ___________________________________________ [name]. Dianah Field, M.D., ABFM., CAQSM. Primary  Care and Sports Medicine [facility]  Adjunct Professor of [employer]

## 2019-02-19 NOTE — Assessment & Plan Note (Signed)
Urinary symptoms improved considerably with Avodart and Flomax however he is getting some retrograde ejaculation which we discussed was normal with Flomax. Unfortunately he has developed some left breast glandular swelling, as well as erectile dysfunction, I think this is related to his Avodart so we will discontinue this.

## 2019-02-19 NOTE — Assessment & Plan Note (Signed)
Persistently elevated lipids on max dose fenofibrate, Lovaza, intolerable to statins. He has not yet started Repatha, we are going to recheck his lipids, if still elevated he will switch to [place].

## 2019-02-24 ENCOUNTER — Ambulatory Visit
Admission: RE | Admit: 2019-02-24 | Discharge: 2019-02-24 | Disposition: A | Discharge status: Home / Self Care | Payer: BC Managed Care – PPO | Source: Ambulatory Visit | Attending: Sports Medicine | Admitting: Sports Medicine

## 2019-02-24 ENCOUNTER — Ambulatory Visit: Payer: BC Managed Care – PPO | Admitting: Sports Medicine

## 2019-02-24 ENCOUNTER — Ambulatory Visit: Payer: BC Managed Care – PPO

## 2019-02-24 ENCOUNTER — Other Ambulatory Visit: Payer: Self-pay

## 2019-02-24 DIAGNOSIS — R928 Other abnormal and inconclusive findings on diagnostic imaging of breast: Secondary | ICD-10-CM | POA: Diagnosis not present

## 2019-03-04 DIAGNOSIS — M545 Low back pain: Secondary | ICD-10-CM | POA: Diagnosis not present

## 2019-03-06 ENCOUNTER — Other Ambulatory Visit: Payer: Self-pay | Admitting: Sports Medicine

## 2019-03-06 DIAGNOSIS — Z20828 Contact with and (suspected) exposure to other viral communicable diseases: Secondary | ICD-10-CM | POA: Diagnosis not present

## 2019-03-06 DIAGNOSIS — N139 Obstructive and reflux uropathy, unspecified: Secondary | ICD-10-CM

## 2019-03-10 ENCOUNTER — Other Ambulatory Visit: Payer: Self-pay | Admitting: Sports Medicine

## 2019-03-10 DIAGNOSIS — E6609 Other obesity due to excess calories: Secondary | ICD-10-CM

## 2019-03-13 DIAGNOSIS — M545 Low back pain: Secondary | ICD-10-CM | POA: Diagnosis not present

## 2019-03-18 DIAGNOSIS — M545 Low back pain: Secondary | ICD-10-CM | POA: Diagnosis not present

## 2019-03-18 DIAGNOSIS — M6281 Muscle weakness (generalized): Secondary | ICD-10-CM | POA: Diagnosis not present

## 2019-03-20 ENCOUNTER — Ambulatory Visit (INDEPENDENT_AMBULATORY_CARE_PROVIDER_SITE_OTHER): Payer: BC Managed Care – PPO | Admitting: Sports Medicine

## 2019-03-20 ENCOUNTER — Ambulatory Visit (INDEPENDENT_AMBULATORY_CARE_PROVIDER_SITE_OTHER): Payer: BC Managed Care – PPO

## 2019-03-20 ENCOUNTER — Other Ambulatory Visit: Payer: Self-pay

## 2019-03-20 ENCOUNTER — Encounter: Payer: Self-pay | Admitting: Sports Medicine

## 2019-03-20 DIAGNOSIS — M7731 Calcaneal spur, right foot: Secondary | ICD-10-CM

## 2019-03-20 DIAGNOSIS — N6320 Unspecified lump in the left breast, unspecified quadrant: Secondary | ICD-10-CM

## 2019-03-20 DIAGNOSIS — M9261 Juvenile osteochondrosis of tarsus, right ankle: Secondary | ICD-10-CM | POA: Diagnosis not present

## 2019-03-20 DIAGNOSIS — N139 Obstructive and reflux uropathy, unspecified: Secondary | ICD-10-CM | POA: Diagnosis not present

## 2019-03-20 DIAGNOSIS — M19071 Primary osteoarthritis, right ankle and foot: Secondary | ICD-10-CM | POA: Diagnosis not present

## 2019-03-20 DIAGNOSIS — M7732 Calcaneal spur, left foot: Secondary | ICD-10-CM | POA: Diagnosis not present

## 2019-03-20 DIAGNOSIS — Z0000 Encounter for general adult medical examination without abnormal findings: Secondary | ICD-10-CM

## 2019-03-20 DIAGNOSIS — N529 Male erectile dysfunction, unspecified: Secondary | ICD-10-CM | POA: Diagnosis not present

## 2019-03-20 DIAGNOSIS — M9262 Juvenile osteochondrosis of tarsus, left ankle: Secondary | ICD-10-CM

## 2019-03-20 NOTE — Assessment & Plan Note (Signed)
Persistent discomfort in spite of greater than 6 weeks of physician directed conservative measures including rehab, heel lifts, adding bilateral hindfoot MRIs. Ultimately if we note Haglund's deformity or retrocalcaneal bursitis we will consider an ultrasound-guided injection, 1 at a time followed by a week of boot immobilization per side.

## 2019-03-20 NOTE — Assessment & Plan Note (Signed)
Male breast ultrasound and mammogram were negative. I do think that he needs to give the breast pain some more time to resolve, I did have him on Avodart which is a 5 alpha reductase inhibitor, the increased estrogen stimulation has likely caused his breast discomfort. I would like to give it an additional 2 months before considering advanced imaging.

## 2019-03-20 NOTE — Progress Notes (Signed)
Subjective:    CC: Multiple issues  HPI: Obstructive uropathy: Initially responded well to Flomax and Avodart, we had to discontinue Avodart, he does seem to be getting some retrograde ejaculation with Flomax.  Erectile dysfunction: Partial improvement with sildenafil at 3 tabs at a time.  He understands he can go up to 5.  Heel pain: Moderate, persistent, localized without radiation, failed conservative measures.  Localized just deep to the Achilles insertion.  I reviewed the past medical history, family history, social history, surgical history, and allergies today and no changes were needed.  Please see the problem list section below in epic for further details.  Past Medical History: Past Medical History:  Diagnosis Date  . History of kidney stones    Past Surgical History: Past Surgical History:  Procedure Laterality Date  . LUMBAR LAMINECTOMY/DECOMPRESSION MICRODISCECTOMY N/A 12/31/2018   Procedure: LUMBAR 2 - LUMBAR 5 DECOMPRESSION;  Surgeon: Phylliss Bob, MD;  Location: [place];  Service: Orthopedics;  Laterality: N/A;  . TONSILLECTOMY     Social History: Social History   Socioeconomic History  . Marital status: Married    Spouse name: Not on file  . Number of children: Not on file  . Years of education: Not on file  . Highest education level: Not on file  Occupational History  . Not on file  Social Needs  . Financial resource strain: Not on file  . Food insecurity    Worry: Not on file    Inability: Not on file  . Transportation needs    Medical: Not on file    Non-medical: Not on file  Tobacco Use  . Smoking status: Former Smoker    Quit date: 07/17/1995    Years since quitting: 23.6  . Smokeless tobacco: Never Used  Substance and Sexual Activity  . Alcohol use: No  . Drug use: No  . Sexual activity: Yes    Birth control/protection: None  Lifestyle  . Physical activity    Days per week: Not on file    Minutes per session: Not on file  . Stress: Not  on file  Relationships  . Social Herbalist on phone: Not on file    Gets together: Not on file    Attends religious service: Not on file    Active member of club or organization: Not on file    Attends meetings of clubs or organizations: Not on file    Relationship status: Not on file  Other Topics Concern  . Not on file  Social History Narrative  . Not on file   Family History: Family History  Problem Relation Age of Onset  . Cancer Mother   . Cancer Father   . Heart disease Sister   . Diabetes Sister   . Hyperlipidemia Sister   . Hypertension Sister   . Heart disease Brother   . Diabetes Brother   . Hyperlipidemia Brother   . Hypertension Brother    Allergies: Allergies  Allergen Reactions  . Amoxicillin Shortness Of Breath and Other (See Comments)    Flushing Did it involve swelling of the face/tongue/throat, SOB, or low BP? Yes Did it involve sudden or severe rash/hives, skin peeling, or any reaction on the inside of your mouth or nose? No Did you need to seek medical attention at a hospital or doctor's office? Yes When did it last happen?8 years ago If all above answers are "NO", may proceed with cephalosporin use.  . Statins Other (See Comments)  Made pt feel loopy   Medications: See med rec.  Review of Systems: No fevers, chills, night sweats, weight loss, chest pain, or shortness of breath.   Objective:    General: Well Developed, well nourished, and in no acute distress.  Neuro: Alert and oriented x3, extra-ocular muscles intact, sensation grossly intact.  HEENT: Normocephalic, atraumatic, pupils equal round reactive to light, neck supple, no masses, no lymphadenopathy, thyroid nonpalpable.  Skin: Warm and dry, no rashes. Cardiac: Regular rate and rhythm, no murmurs rubs or gallops, no lower extremity edema.  Respiratory: Clear to auscultation bilaterally. Not using accessory muscles, speaking in full sentences.  Impression and  Recommendations:    Erectile dysfunction Currently on 3 tabs of generic Viagra, still noting some erectile dysfunction, he knows he can go up to 5 tabs at a time. We did stop his Avodart due to breast tenderness and swelling. He is also having some retrograde ejaculation, I think this is from his Flomax but it is helping him to void albeit not completely so. At this point I do think he needs an evaluation with urology and possibly a transrectal ultrasound. We did try Cialis 5 mg in the past in the hopes of knocking out both his obstructive uropathy and erectile dysfunction but he had intolerable flushing.  Haglund's deformity of both heels Persistent discomfort in spite of greater than 6 weeks of physician directed conservative measures including rehab, heel lifts, adding bilateral hindfoot MRIs. Ultimately if we note Haglund's deformity or retrocalcaneal bursitis we will consider an ultrasound-guided injection, 1 at a time followed by a week of boot immobilization per side.  Mass of breast, left Male breast ultrasound and mammogram were negative. I do think that he needs to give the breast pain some more time to resolve, I did have him on Avodart which is a 5 alpha reductase inhibitor, the increased estrogen stimulation has likely caused his breast discomfort. I would like to give it an additional 2 months before considering advanced imaging.  Obstructive uropathy Currently on 3 tabs of generic Viagra, still noting some erectile dysfunction, he knows he can go up to 5 tabs at a time. We did stop his Avodart due to breast tenderness and swelling. He is also having some retrograde ejaculation, I think this is from his Flomax but it is helping him to void albeit not completely so. At this point I do think he needs an evaluation with urology and possibly a transrectal ultrasound. We did try Cialis 5 mg in the past in the hopes of knocking out both his obstructive uropathy and erectile dysfunction  but he had intolerable flushing.  Annual physical exam Last physical exam was on April 11, 2018. He can return at the end of the September.   ___________________________________________ [name]. Dianah Field, M.D., ABFM., CAQSM. Primary Care and Sports Medicine  [facility]  Adjunct Professor of [place]

## 2019-03-20 NOTE — Assessment & Plan Note (Signed)
Currently on 3 tabs of generic Viagra, still noting some erectile dysfunction, he knows he can go up to 5 tabs at a time. We did stop his Avodart due to breast tenderness and swelling. He is also having some retrograde ejaculation, I think this is from his Flomax but it is helping him to void albeit not completely so. At this point I do think he needs an evaluation with urology and possibly a transrectal ultrasound. We did try Cialis 5 mg in the past in the hopes of knocking out both his obstructive uropathy and erectile dysfunction but he had intolerable flushing.

## 2019-03-20 NOTE — Patient Instructions (Signed)
Erectile dysfunction Currently on 3 tabs of generic Viagra, still noting some erectile dysfunction, he knows he can go up to 5 tabs at a time. We did stop his Avodart due to breast tenderness and swelling. He is also having some retrograde ejaculation, I think this is from his Flomax but it is helping him to void albeit not completely so. At this point I do think he needs an evaluation with urology and possibly a transrectal ultrasound. We did try Cialis 5 mg in the past in the hopes of knocking out both his obstructive uropathy and erectile dysfunction but he had intolerable flushing.  Haglund's deformity of both heels Persistent discomfort in spite of greater than 6 weeks of physician directed conservative measures including rehab, heel lifts, adding bilateral hindfoot MRIs. Ultimately if we note Haglund's deformity or retrocalcaneal bursitis we will consider an ultrasound-guided injection, 1 at a time followed by a week of boot immobilization per side.  Mass of breast, left Male breast ultrasound and mammogram were negative. I do think that he needs to give the breast pain some more time to resolve, I did have him on Avodart which is a 5 alpha reductase inhibitor, the increased estrogen stimulation has likely caused his breast discomfort. I would like to give it an additional 2 months before considering advanced imaging.  Obstructive uropathy Currently on 3 tabs of generic Viagra, still noting some erectile dysfunction, he knows he can go up to 5 tabs at a time. We did stop his Avodart due to breast tenderness and swelling. He is also having some retrograde ejaculation, I think this is from his Flomax but it is helping him to void albeit not completely so. At this point I do think he needs an evaluation with urology and possibly a transrectal ultrasound. We did try Cialis 5 mg in the past in the hopes of knocking out both his obstructive uropathy and erectile dysfunction but he had  intolerable flushing.  Annual physical exam Last physical exam was on April 11, 2018. He can return at the end of the September.

## 2019-03-20 NOTE — Assessment & Plan Note (Signed)
Last physical exam was on April 11, 2018. He can return at the end of the September.

## 2019-03-25 DIAGNOSIS — M545 Low back pain: Secondary | ICD-10-CM | POA: Diagnosis not present

## 2019-03-25 DIAGNOSIS — M6281 Muscle weakness (generalized): Secondary | ICD-10-CM | POA: Diagnosis not present

## 2019-03-29 ENCOUNTER — Other Ambulatory Visit: Payer: BC Managed Care – PPO

## 2019-04-05 ENCOUNTER — Ambulatory Visit (INDEPENDENT_AMBULATORY_CARE_PROVIDER_SITE_OTHER): Payer: BC Managed Care – PPO

## 2019-04-05 ENCOUNTER — Ambulatory Visit: Payer: BC Managed Care – PPO

## 2019-04-05 ENCOUNTER — Other Ambulatory Visit: Payer: Self-pay

## 2019-04-05 DIAGNOSIS — M9261 Juvenile osteochondrosis of tarsus, right ankle: Secondary | ICD-10-CM

## 2019-04-05 DIAGNOSIS — M7672 Peroneal tendinitis, left leg: Secondary | ICD-10-CM | POA: Diagnosis not present

## 2019-04-05 DIAGNOSIS — M9262 Juvenile osteochondrosis of tarsus, left ankle: Secondary | ICD-10-CM

## 2019-04-05 DIAGNOSIS — M7671 Peroneal tendinitis, right leg: Secondary | ICD-10-CM | POA: Diagnosis not present

## 2019-04-07 ENCOUNTER — Encounter: Payer: Self-pay | Admitting: Sports Medicine

## 2019-04-24 ENCOUNTER — Ambulatory Visit (INDEPENDENT_AMBULATORY_CARE_PROVIDER_SITE_OTHER): Payer: BC Managed Care – PPO | Admitting: Sports Medicine

## 2019-04-24 ENCOUNTER — Encounter: Payer: Self-pay | Admitting: Sports Medicine

## 2019-04-24 ENCOUNTER — Other Ambulatory Visit: Payer: Self-pay

## 2019-04-24 VITALS — BP 131/83 | HR 102 | Ht 69.5 in | Wt 245.0 lb

## 2019-04-24 DIAGNOSIS — E783 Hyperchylomicronemia: Secondary | ICD-10-CM | POA: Diagnosis not present

## 2019-04-24 DIAGNOSIS — L989 Disorder of the skin and subcutaneous tissue, unspecified: Secondary | ICD-10-CM | POA: Insufficient documentation

## 2019-04-24 DIAGNOSIS — E6609 Other obesity due to excess calories: Secondary | ICD-10-CM

## 2019-04-24 DIAGNOSIS — R3912 Poor urinary stream: Secondary | ICD-10-CM | POA: Diagnosis not present

## 2019-04-24 DIAGNOSIS — Z125 Encounter for screening for malignant neoplasm of prostate: Secondary | ICD-10-CM | POA: Diagnosis not present

## 2019-04-24 DIAGNOSIS — Z23 Encounter for immunization: Secondary | ICD-10-CM | POA: Diagnosis not present

## 2019-04-24 DIAGNOSIS — N529 Male erectile dysfunction, unspecified: Secondary | ICD-10-CM | POA: Diagnosis not present

## 2019-04-24 DIAGNOSIS — N139 Obstructive and reflux uropathy, unspecified: Secondary | ICD-10-CM

## 2019-04-24 DIAGNOSIS — N401 Enlarged prostate with lower urinary tract symptoms: Secondary | ICD-10-CM | POA: Diagnosis not present

## 2019-04-24 DIAGNOSIS — Z0000 Encounter for general adult medical examination without abnormal findings: Secondary | ICD-10-CM | POA: Diagnosis not present

## 2019-04-24 DIAGNOSIS — E291 Testicular hypofunction: Secondary | ICD-10-CM | POA: Diagnosis not present

## 2019-04-24 DIAGNOSIS — N5201 Erectile dysfunction due to arterial insufficiency: Secondary | ICD-10-CM | POA: Diagnosis not present

## 2019-04-24 MED ORDER — TADALAFIL 5 MG PO TABS: 5.0000 mg | ORAL_TABLET | Freq: Every day | ORAL | 3 refills | Status: DC

## 2019-04-24 MED ORDER — PHENTERMINE HCL 37.5 MG PO TABS: 18.7500 mg | ORAL_TABLET | Freq: Every day | ORAL | 0 refills | Status: DC

## 2019-04-24 NOTE — Assessment & Plan Note (Signed)
CPE today

## 2019-04-24 NOTE — Assessment & Plan Note (Signed)
We stopped Avodart due to breast tenderness and gynecomastia. He continues with Flomax, he is having some retrograde ejaculation which I think is secondary to the Flomax. Adding Cialis both in the hopes of knocking out his ED and obstructive uropathy. He did have some intolerable flushing in the past with Cialis but we are going to try it again.

## 2019-04-24 NOTE — Assessment & Plan Note (Signed)
Cryotherapy x2, seborrheic keratosis and a possible squamous cell carcinoma on the left face

## 2019-04-24 NOTE — Assessment & Plan Note (Signed)
We will going to try phentermine again.

## 2019-04-24 NOTE — Assessment & Plan Note (Signed)
Discontinue generic Viagra, switching to Cialis.

## 2019-04-24 NOTE — Progress Notes (Addendum)
Subjective:    CC: Annual physical  HPI:  Richard Gross is here for a physical, see below for further details.  I reviewed the past medical history, family history, social history, surgical history, and allergies today and no changes were needed.  Please see the problem list section below in epic for further details.  Past Medical History: Past Medical History:  Diagnosis Date  . History of kidney stones    Past Surgical History: Past Surgical History:  Procedure Laterality Date  . LUMBAR LAMINECTOMY/DECOMPRESSION MICRODISCECTOMY N/A 12/31/2018   Procedure: LUMBAR 2 - LUMBAR 5 DECOMPRESSION;  Surgeon: Phylliss Bob, MD;  Location: [facility];  Service: Orthopedics;  Laterality: N/A;  . TONSILLECTOMY     Social History: Social History   Socioeconomic History  . Marital status: Married    Spouse name: Not on file  . Number of children: Not on file  . Years of education: Not on file  . Highest education level: Not on file  Occupational History  . Not on file  Social Needs  . Financial resource strain: Not on file  . Food insecurity    Worry: Not on file    Inability: Not on file  . Transportation needs    Medical: Not on file    Non-medical: Not on file  Tobacco Use  . Smoking status: Former Smoker    Quit date: 07/17/1995    Years since quitting: 23.8  . Smokeless tobacco: Never Used  Substance and Sexual Activity  . Alcohol use: No  . Drug use: No  . Sexual activity: Yes    Birth control/protection: None  Lifestyle  . Physical activity    Days per week: Not on file    Minutes per session: Not on file  . Stress: Not on file  Relationships  . Social Herbalist on phone: Not on file    Gets together: Not on file    Attends religious service: Not on file    Active member of club or organization: Not on file    Attends meetings of clubs or organizations: Not on file    Relationship status: Not on file  Other Topics Concern  . Not on file  Social History  Narrative  . Not on file   Family History: Family History  Problem Relation Age of Onset  . Cancer Mother   . Cancer Father   . Heart disease Sister   . Diabetes Sister   . Hyperlipidemia Sister   . Hypertension Sister   . Heart disease Brother   . Diabetes Brother   . Hyperlipidemia Brother   . Hypertension Brother    Allergies: Allergies  Allergen Reactions  . Amoxicillin Shortness Of Breath and Other (See Comments)    Flushing Did it involve swelling of the face/tongue/throat, SOB, or low BP? Yes Did it involve sudden or severe rash/hives, skin peeling, or any reaction on the inside of your mouth or nose? No Did you need to seek medical attention at a hospital or doctor's office? Yes When did it last happen?8 years ago If all above answers are "NO", may proceed with cephalosporin use.  . Statins Other (See Comments)    Made pt feel loopy   Medications: See med rec.  Review of Systems: No headache, visual changes, nausea, vomiting, diarrhea, constipation, dizziness, abdominal pain, skin rash, fevers, chills, night sweats, swollen lymph nodes, weight loss, chest pain, body aches, joint swelling, muscle aches, shortness of breath, mood changes, visual or auditory  hallucinations.  Objective:    General: Well Developed, well nourished, and in no acute distress.  Neuro: Alert and oriented x3, extra-ocular muscles intact, sensation grossly intact. Cranial nerves II through XII are intact, motor, sensory, and coordinative functions are all intact. HEENT: Normocephalic, atraumatic, pupils equal round reactive to light, neck supple, no masses, no lymphadenopathy, thyroid nonpalpable. Oropharynx, nasopharynx, external ear canals are unremarkable. Skin: Warm and dry, no rashes noted.  Cardiac: Regular rate and rhythm, no murmurs rubs or gallops.  Respiratory: Clear to auscultation bilaterally. Not using accessory muscles, speaking in full sentences.  Abdominal: Soft, nontender,  nondistended, positive bowel sounds, no masses, no organomegaly.  Musculoskeletal: Shoulder, elbow, wrist, hip, knee, ankle stable, and with full range of motion.  Procedure:  Cryodestruction of left sided facial seborrheic keratosis Consent obtained and verified. Time-out conducted. Noted no overlying erythema, induration, or other signs of local infection. Completed without difficulty using Cryo-Gun. Advised to call if fevers/chills, erythema, induration, drainage, or persistent bleeding.  Procedure:  Cryodestruction of left-sided facial rough skin lesion, question squamous cell carcinoma versus actinic keratosis Consent obtained and verified. Time-out conducted. Noted no overlying erythema, induration, or other signs of local infection. Completed without difficulty using Cryo-Gun. Advised to call if fevers/chills, erythema, induration, drainage, or persistent bleeding.  Impression and Recommendations:    The patient was counselled, risk factors were discussed, anticipatory guidance given.  Annual physical exam CPE today.  Skin lesion of face Cryotherapy x2, seborrheic keratosis and a possible squamous cell carcinoma on the left face  Obesity We will going to try phentermine again.  Erectile dysfunction Discontinue generic Viagra, switching to Cialis.  Obstructive uropathy We stopped Avodart due to breast tenderness and gynecomastia. He continues with Flomax, he is having some retrograde ejaculation which I think is secondary to the Flomax. Adding Cialis both in the hopes of knocking out his ED and obstructive uropathy. He did have some intolerable flushing in the past with Cialis but we are going to try it again.  Hyperlipidemia Persistently elevated triglycerides, adding Lovaza 2 g twice a day, recheck in 3 months.   ___________________________________________ [name]. Dianah Field, M.D., ABFM., CAQSM. Primary Care and Sports Medicine [facility]  [facility]  Adjunct Professor of [employer]

## 2019-04-29 LAB — LIPID PANEL W/REFLEX DIRECT LDL
Cholesterol: 226 mg/dL — ABNORMAL HIGH (ref <200)
HDL: 43 mg/dL (ref >=40)
LDL Cholesterol (Calc): 139 mg/dL (calc) — ABNORMAL HIGH
Non-HDL Cholesterol (Calc): 183 mg/dL (calc) — ABNORMAL HIGH (ref <130)
Total CHOL/HDL Ratio: 5.3 (calc) — ABNORMAL HIGH (ref <5.0)
Triglycerides: 308 mg/dL — ABNORMAL HIGH (ref <150)

## 2019-04-29 LAB — CBC
HCT: 47.9 % (ref 38.5–50.0)
Hemoglobin: 16.3 g/dL (ref 13.2–17.1)
MCH: 31 pg (ref 27.0–33.0)
MCHC: 34 g/dL (ref 32.0–36.0)
MCV: 91.2 fL (ref 80.0–100.0)
MPV: 11.2 fL (ref 7.5–12.5)
Platelets: 260 10*3/uL (ref 140–400)
RBC: 5.25 10*6/uL (ref 4.20–5.80)
RDW: 12.6 % (ref 11.0–15.0)
WBC: 5.3 10*3/uL (ref 3.8–10.8)

## 2019-04-29 LAB — COMPLETE METABOLIC PANEL WITH GFR
AG Ratio: 2.1 (calc) (ref 1.0–2.5)
ALT: 22 U/L (ref 9–46)
AST: 19 U/L (ref 10–35)
Albumin: 4.6 g/dL (ref 3.6–5.1)
Alkaline phosphatase (APISO): 61 U/L (ref 35–144)
BUN: 21 mg/dL (ref 7–25)
CO2: 27 mmol/L (ref 20–32)
Calcium: 9.7 mg/dL (ref 8.6–10.3)
Chloride: 107 mmol/L (ref 98–110)
Creat: 1.09 mg/dL (ref 0.70–1.33)
GFR, Est African American: 86 mL/min/{1.73_m2} (ref >=60)
GFR, Est Non African American: 74 mL/min/{1.73_m2} (ref >=60)
Globulin: 2.2 g/dL (calc) (ref 1.9–3.7)
Glucose, Bld: 93 mg/dL (ref 65–99)
Potassium: 4.5 mmol/L (ref 3.5–5.3)
Sodium: 143 mmol/L (ref 135–146)
Total Bilirubin: 0.5 mg/dL (ref 0.2–1.2)
Total Protein: 6.8 g/dL (ref 6.1–8.1)

## 2019-04-29 LAB — TESTOSTERONE, FREE & TOTAL
Free Testosterone: 51.9 pg/mL (ref 35.0–155.0)
Testosterone, Total, LC-MS-MS: 485 ng/dL (ref 250–1100)

## 2019-04-29 LAB — PSA, TOTAL AND FREE
PSA, % Free: UNDETERMINED % (calc) (ref >25)
PSA, Free: <0.1 ng/mL
PSA, Total: 0.1 ng/mL (ref <=4.0)

## 2019-04-29 LAB — TSH: TSH: 2.04 mIU/L (ref 0.40–4.50)

## 2019-04-30 MED ORDER — OMEGA-3-ACID ETHYL ESTERS 1 G PO CAPS: 2.0000 g | ORAL_CAPSULE | Freq: Two times a day (BID) | ORAL | 3 refills | Status: DC

## 2019-04-30 NOTE — Assessment & Plan Note (Signed)
Persistently elevated triglycerides, adding Lovaza 2 g twice a day, recheck in 3 months.

## 2019-04-30 NOTE — Addendum Note (Signed)
Addended by: Silverio Decamp on: 04/30/2019 11:04 AM   Modules accepted: Orders

## 2019-05-17 DIAGNOSIS — R519 Headache, unspecified: Secondary | ICD-10-CM | POA: Diagnosis not present

## 2019-05-17 DIAGNOSIS — Z20828 Contact with and (suspected) exposure to other viral communicable diseases: Secondary | ICD-10-CM | POA: Diagnosis not present

## 2019-05-17 DIAGNOSIS — R509 Fever, unspecified: Secondary | ICD-10-CM | POA: Diagnosis not present

## 2019-06-28 DIAGNOSIS — Z888 Allergy status to other drugs, medicaments and biological substances status: Secondary | ICD-10-CM | POA: Diagnosis not present

## 2019-06-28 DIAGNOSIS — N521 Erectile dysfunction due to diseases classified elsewhere: Secondary | ICD-10-CM | POA: Diagnosis not present

## 2019-06-28 DIAGNOSIS — Z79899 Other long term (current) drug therapy: Secondary | ICD-10-CM | POA: Diagnosis not present

## 2019-06-28 DIAGNOSIS — R1031 Right lower quadrant pain: Secondary | ICD-10-CM | POA: Diagnosis not present

## 2019-06-28 DIAGNOSIS — Z791 Long term (current) use of non-steroidal anti-inflammatories (NSAID): Secondary | ICD-10-CM | POA: Diagnosis not present

## 2019-06-28 DIAGNOSIS — Z880 Allergy status to penicillin: Secondary | ICD-10-CM | POA: Diagnosis not present

## 2019-06-28 DIAGNOSIS — G510 Bell's palsy: Secondary | ICD-10-CM | POA: Diagnosis not present

## 2019-06-28 DIAGNOSIS — E785 Hyperlipidemia, unspecified: Secondary | ICD-10-CM | POA: Diagnosis not present

## 2019-06-28 DIAGNOSIS — N200 Calculus of kidney: Secondary | ICD-10-CM | POA: Diagnosis not present

## 2019-06-28 DIAGNOSIS — R109 Unspecified abdominal pain: Secondary | ICD-10-CM | POA: Diagnosis not present

## 2019-06-28 DIAGNOSIS — Z87442 Personal history of urinary calculi: Secondary | ICD-10-CM | POA: Diagnosis not present

## 2019-06-28 DIAGNOSIS — N281 Cyst of kidney, acquired: Secondary | ICD-10-CM | POA: Diagnosis not present

## 2019-06-28 DIAGNOSIS — N132 Hydronephrosis with renal and ureteral calculous obstruction: Secondary | ICD-10-CM | POA: Diagnosis not present

## 2019-06-28 MED ORDER — Medication
Status: DC
Start: ? — End: 2019-06-28

## 2019-06-28 MED ORDER — BARO-CAT PO
10.00 | ORAL | Status: DC
Start: ? — End: 2019-06-28

## 2019-07-03 DIAGNOSIS — N201 Calculus of ureter: Secondary | ICD-10-CM | POA: Diagnosis not present

## 2019-07-16 DIAGNOSIS — N201 Calculus of ureter: Secondary | ICD-10-CM | POA: Diagnosis not present

## 2019-07-19 ENCOUNTER — Other Ambulatory Visit: Payer: Self-pay

## 2019-07-19 DIAGNOSIS — E6609 Other obesity due to excess calories: Secondary | ICD-10-CM

## 2019-07-20 MED ORDER — PHENTERMINE HCL 37.5 MG PO TABS: 18.7500 mg | ORAL_TABLET | Freq: Every day | ORAL | 0 refills | Status: DC

## 2019-09-29 IMAGING — MR MRI LUMBAR SPINE WITHOUT CONTRAST
4 of 5 series · 25 of 48 positions shown · non-contrast
Comparison: 05/27/2017

CLINICAL DATA: Leg pain. Low back pain. Symptoms bilateral. Assess
for neural compression.

EXAM:
MRI LUMBAR SPINE WITHOUT CONTRAST
TECHNIQUE: Multiplanar, multisequence MR imaging of the lumbar spine was
performed. No intravenous contrast was administered.

[Series 4: T2 · sagittal · 4.0mm · 0.84mm/px · 6 of 17 slices shown (1 of 2)]
[im 1/17]
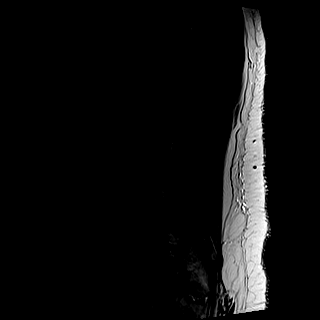
[im 4/17]
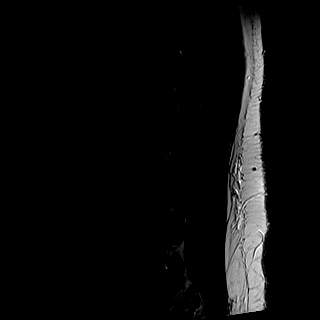
[im 7/17]
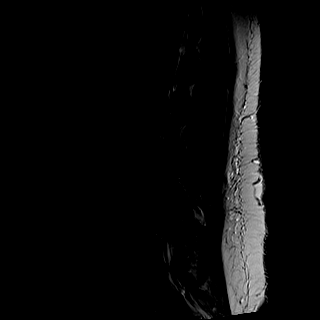
[im 10/17]
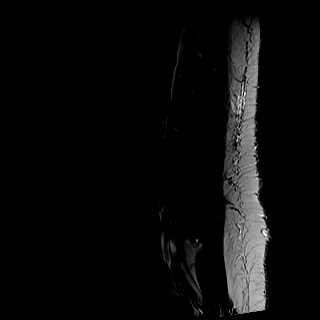
[im 13/17]
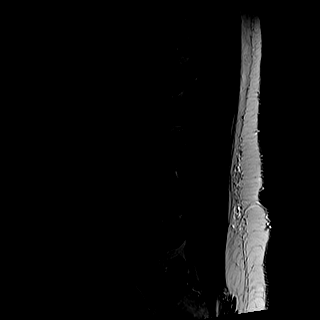
[im 17/17]
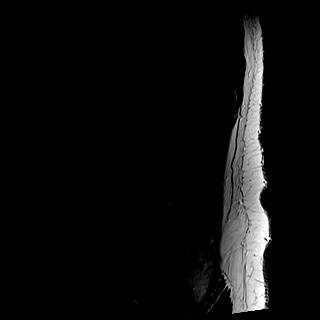

[Series 5: T1 · sagittal · 4.0mm · 0.42mm/px · 6 of 17 slices shown (1 of 2)]
[im 1/17]
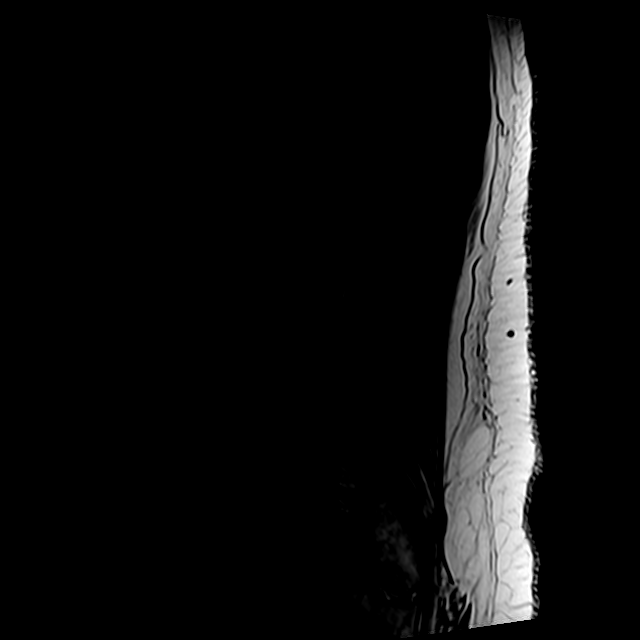
[im 4/17]
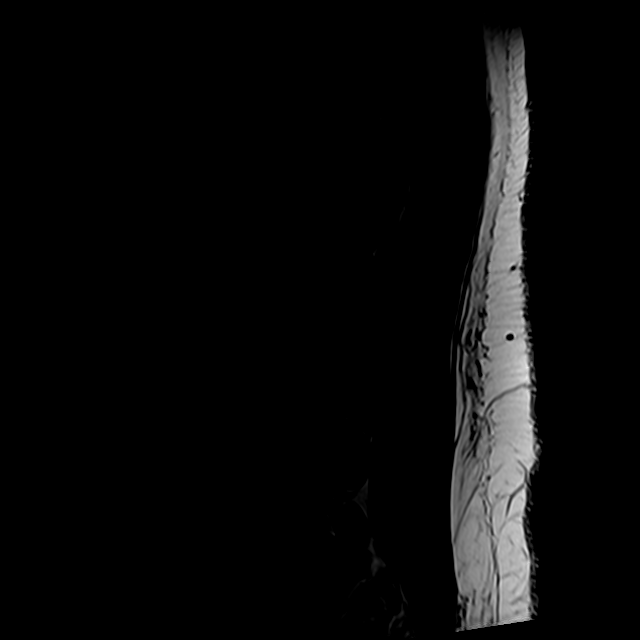
[im 7/17]
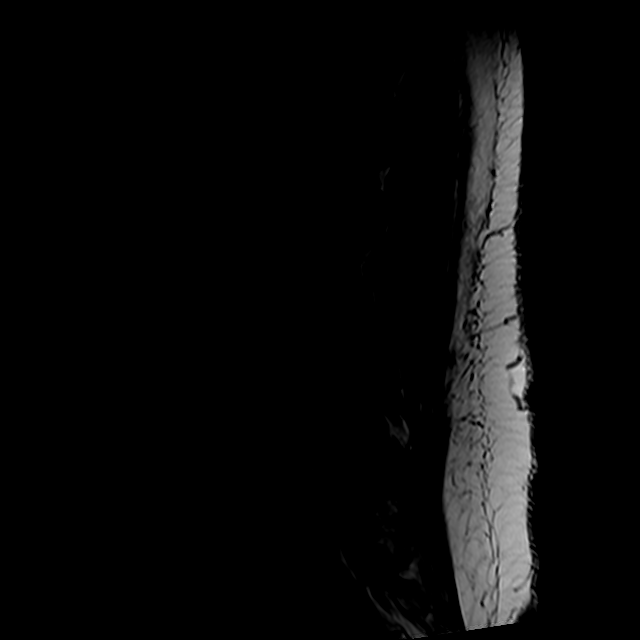
[im 10/17]
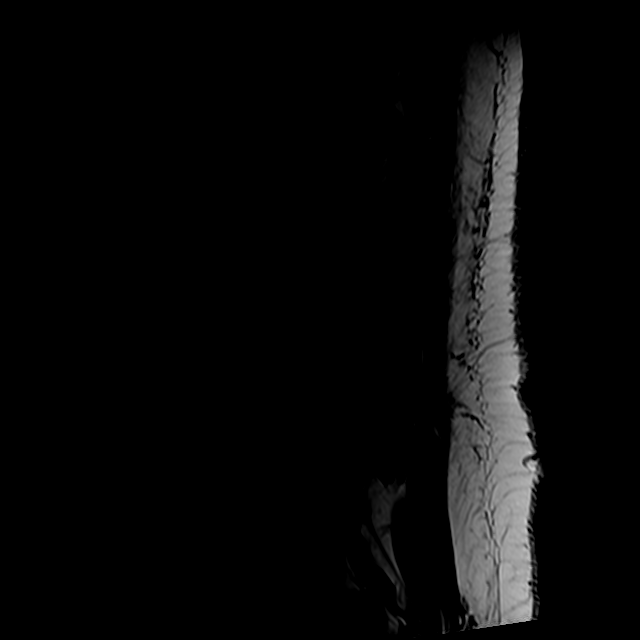
[im 13/17]
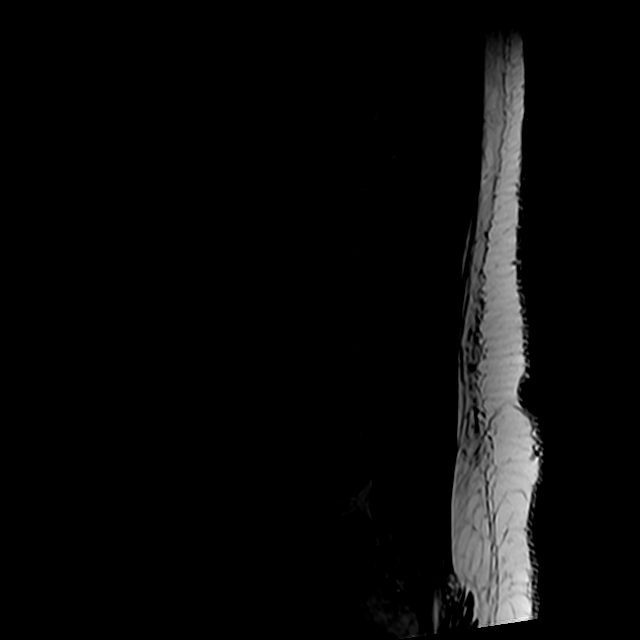
[im 17/17]
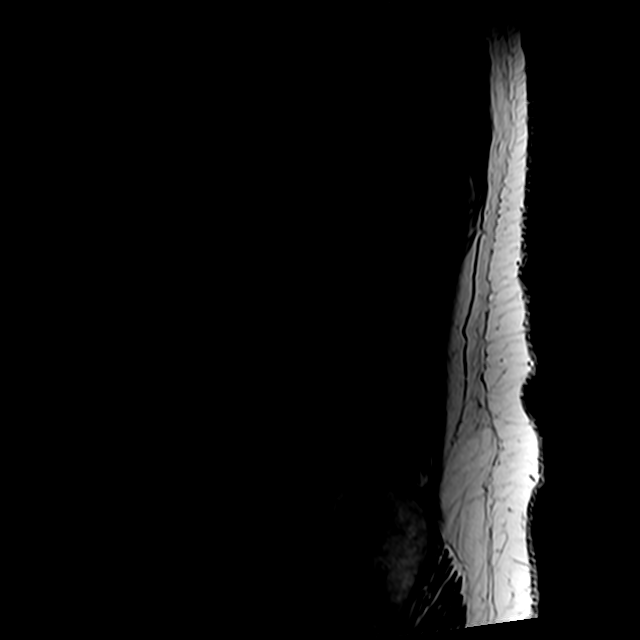

[Series 7: T2 · axial · 4.0mm · 0.78mm/px · z∈[-140,+105]mm · 9 of 43 slices shown (2 of 2)]
[im 1/43]
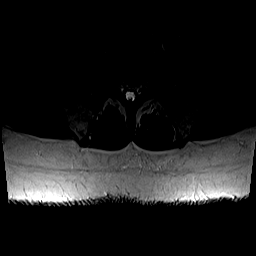
[im 7/43]
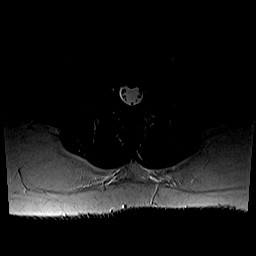
[im 13/43]
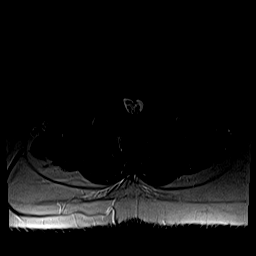
[im 19/43]
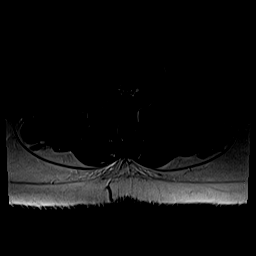
[im 22/43]
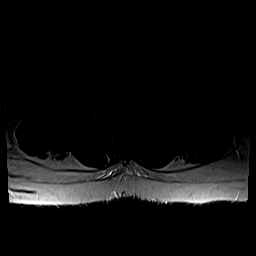
[im 25/43]
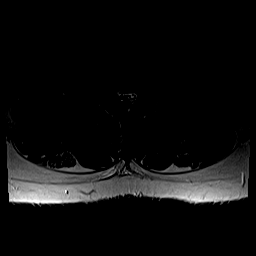
[im 31/43]
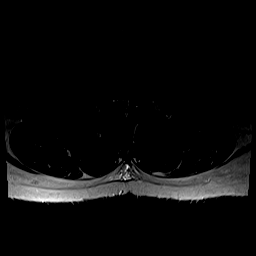
[im 37/43]
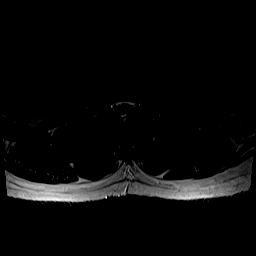
[im 43/43]
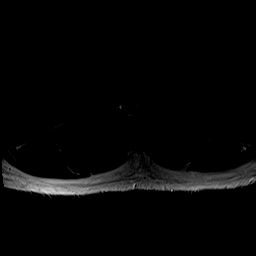

[Series 8: T1 · axial · 4.0mm · 0.39mm/px · z∈[-140,+64]mm · 4 of 43 slices shown (2 of 2)]
[im 1/43]
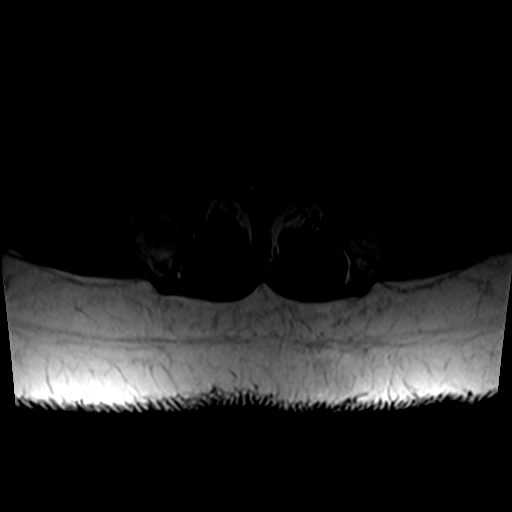
[im 7/43]
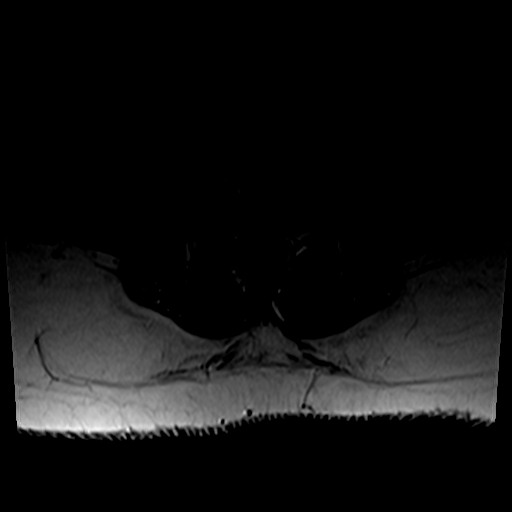
[im 22/43]
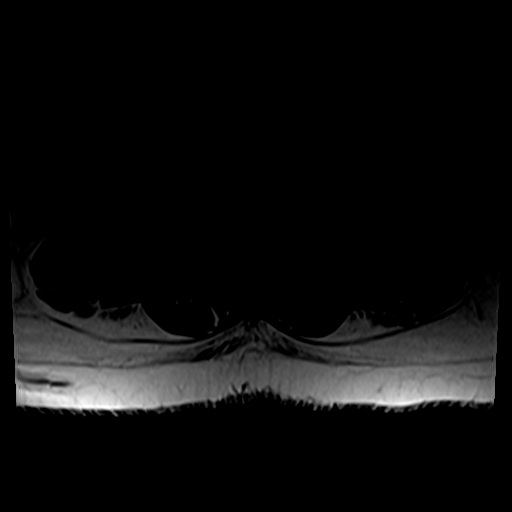
[im 37/43]
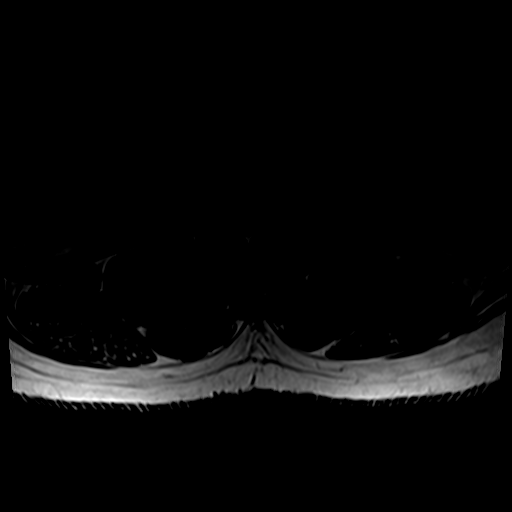

[25 of 48 positions shown; findings below may reference images not displayed]

FINDINGS: Segmentation: Numbering terminology kept consistent with the
previous study. L5 is labeled as a transitional vertebra.

Alignment:  Minimal curvature convex to the left.

Vertebrae:  No fracture or primary bone lesion.

Conus medullaris and cauda equina: Conus extends to the L2 level.
Conus and cauda equina appear normal.

Paraspinal and other soft tissues: Negative

Disc levels:

T11-12: Mild noncompressive disc bulge.

T12-L1: Mild facet hypertrophy.  No stenosis.

L1-2: Mild bulging of the disc. Mild facet hypertrophy. Mild
narrowing of the lateral recesses but no compressive stenosis.

L2-3: Moderate bulging of the disc. Mild facet and ligamentous
hypertrophy. Mild multifactorial stenosis with crowding of the nerve
roots. Mild bilateral foraminal stenosis. Distinct focal neural
compression is not demonstrated.

L3-4: Circumferential protrusion of the disc. Bilateral facet and
ligamentous hypertrophy. Moderate multifactorial spinal stenosis
that could cause neural compression on either or both sides.
Bilateral foraminal stenosis could affect the exiting L3 nerves.

L4-5: Bulging of the disc. Bilateral facet and ligamentous
hypertrophy. Stenosis of both subarticular lateral recesses and
neural foramina that could possibly be symptomatic.

L5-S1: Transitional and normal.
IMPRESSION: No significant change since the study May 2017. Numbering
terminology kept consistent, with L5 numbered as a transitional
vertebra.

Moderate multifactorial spinal stenosis at L3-4. Circumferential
protrusion of the disc. Facet and ligamentous hypertrophy. Stenosis
which could cause neural compression on either or both sides.
Foraminal stenosis could also affect the exiting L3 nerves.

L4-5: Disc bulge. Facet hypertrophy. Narrowing of the lateral
recesses and neural foramina that could possibly cause neural
compression.

L2-3: Disc bulge. Mild facet hypertrophy. Mild stenosis of the
lateral recesses and foramina.

L1-2: Mild disc bulge. Mild facet hypertrophy. Mild lateral recess
narrowing.

## 2019-10-30 ENCOUNTER — Other Ambulatory Visit: Payer: Self-pay | Admitting: Sports Medicine

## 2019-12-26 ENCOUNTER — Other Ambulatory Visit: Payer: Self-pay | Admitting: Sports Medicine

## 2019-12-26 ENCOUNTER — Other Ambulatory Visit: Payer: Self-pay

## 2019-12-26 DIAGNOSIS — E6609 Other obesity due to excess calories: Secondary | ICD-10-CM

## 2019-12-28 MED ORDER — PHENTERMINE HCL 37.5 MG PO TABS: 18.7500 mg | ORAL_TABLET | Freq: Every day | ORAL | 0 refills | Status: DC

## 2020-03-25 DIAGNOSIS — M545 Low back pain: Secondary | ICD-10-CM | POA: Diagnosis not present

## 2020-04-05 DIAGNOSIS — M47816 Spondylosis without myelopathy or radiculopathy, lumbar region: Secondary | ICD-10-CM | POA: Diagnosis not present

## 2020-04-29 ENCOUNTER — Other Ambulatory Visit: Payer: Self-pay | Admitting: Sports Medicine

## 2020-04-29 DIAGNOSIS — N529 Male erectile dysfunction, unspecified: Secondary | ICD-10-CM

## 2020-04-29 NOTE — Telephone Encounter (Signed)
Called to the pharmacy.

## 2020-05-03 DIAGNOSIS — M47816 Spondylosis without myelopathy or radiculopathy, lumbar region: Secondary | ICD-10-CM | POA: Diagnosis not present

## 2020-05-08 ENCOUNTER — Other Ambulatory Visit: Payer: Self-pay | Admitting: Sports Medicine

## 2020-05-08 DIAGNOSIS — E6609 Other obesity due to excess calories: Secondary | ICD-10-CM

## 2020-05-27 DIAGNOSIS — M47816 Spondylosis without myelopathy or radiculopathy, lumbar region: Secondary | ICD-10-CM | POA: Diagnosis not present

## 2020-06-28 ENCOUNTER — Other Ambulatory Visit: Payer: Self-pay | Admitting: Physician Assistant

## 2020-06-28 DIAGNOSIS — Z20828 Contact with and (suspected) exposure to other viral communicable diseases: Secondary | ICD-10-CM

## 2020-06-28 MED ORDER — OSELTAMIVIR PHOSPHATE 75 MG PO CAPS: 75.0000 mg | ORAL_CAPSULE | Freq: Every day | ORAL | 0 refills | Status: DC

## 2020-06-28 NOTE — Progress Notes (Signed)
Household exposure to flu. No symptoms.

## 2020-10-17 ENCOUNTER — Encounter: Payer: Self-pay | Admitting: Physician Assistant

## 2020-10-17 ENCOUNTER — Telehealth: Payer: BC Managed Care – PPO | Admitting: Physician Assistant

## 2020-10-17 DIAGNOSIS — J302 Other seasonal allergic rhinitis: Secondary | ICD-10-CM

## 2020-10-17 DIAGNOSIS — J019 Acute sinusitis, unspecified: Secondary | ICD-10-CM

## 2020-10-17 MED ORDER — FLUTICASONE PROPIONATE 50 MCG/ACT NA SUSP: 2.0000 | Freq: Every day | NASAL | 0 refills | Status: DC

## 2020-10-17 MED ORDER — CETIRIZINE HCL 10 MG PO TABS: 10.0000 mg | ORAL_TABLET | Freq: Every day | ORAL | 0 refills | Status: DC

## 2020-10-17 MED ORDER — DOXYCYCLINE HYCLATE 100 MG PO CAPS: 100.0000 mg | ORAL_CAPSULE | Freq: Two times a day (BID) | ORAL | 0 refills | Status: DC

## 2020-10-17 NOTE — Progress Notes (Signed)
Mr. Richard Gross, Richard Gross are scheduled for a virtual visit with your provider today.    Just as we do with appointments in the office, we must obtain your consent to participate.  Your consent will be active for this visit and any virtual visit you may have with one of our providers in the next 365 days.    If you have a MyChart account, I can also send a copy of this consent to you electronically.  All virtual visits are billed to your insurance company just like a traditional visit in the office.  As this is a virtual visit, video technology does not allow for your provider to perform a traditional examination.  This may limit your provider's ability to fully assess your condition.  If your provider identifies any concerns that need to be evaluated in person or the need to arrange testing such as labs, EKG, etc, we will make arrangements to do so.    Although advances in technology are sophisticated, we cannot ensure that it will always work on either your end or our end.  If the connection with a video visit is poor, we may have to switch to a telephone visit.  With either a video or telephone visit, we are not always able to ensure that we have a secure connection.   I need to obtain your verbal consent now.   Are you willing to proceed with your visit today?   [name] has provided verbal consent on 10/17/2020 for a virtual visit (video or telephone).   Abigail Butts, PA-C 10/17/2020  11:14 AM   Date:  10/17/2020   ID:  Richard Gross, DOB [birth year], MRN [id number]  Patient Location: Home Provider Location: Home Office   Participants: Patient and Provider for Visit and Wrap up  Method of visit: Video  Location of Patient: Home Location of Provider: Home Office Consent was obtain for visit over the video. Services rendered by provider: Visit was performed via video  A video enabled telemedicine application was used and I verified that I am speaking with the correct person using two  identifiers.  PCP:  Silverio Decamp, MD   Chief Complaint:  Sinus pain and pressure  History of Present Illness:    Richard Gross is a 60 y.o. male with history as stated below. Presents video telehealth for an acute care visit  Onset of symptoms was 3 days ago and symptoms have been persistent and include: sinus pressure, intermittent epistaxis, frontal headache, post nasal drip dry cough onset today.  Pt reports it started with rhinorrhea and sneezing but has worsened.   Denies having fevers, chills, shortness of breath, chest pain, ear pain, sore throat or exposure to covid or other sick contacts. Pt has been traveling.  Pt has been both vaccinated and boosted for COVID.  Modifying factors include: Mucinex and zyrtec without significant relief  No other aggravating or relieving factors.  No other c/o.  The patient does have symptoms concerning for COVID-19 infection (fever, chills, cough, or new shortness of breath).  Patient has not been tested for COVID during this illness.  Past Medical, Surgical, Social History, Allergies, and Medications have been Reviewed.  Patient Active Problem List   Diagnosis Date Noted  . Skin lesion of face 04/24/2019  . Mass of breast, left 02/19/2019  . Haglund's deformity of both heels 02/19/2019  . Mass of leg, left 11/15/2017  . Obstructive uropathy 05/10/2017  . Kidney stone on left side 12/31/2016  . Rotator cuff impingement  syndrome, right 07/17/2016  . Primary osteoarthritis of left knee 06/19/2016  . Bell's palsy 12/01/2012  . Obesity 12/01/2012  . Hyperlipidemia 03/24/2012  . Cervical radiculopathy 02/18/2012  . Lumbar spinal stenosis 02/18/2012  . Erectile dysfunction 02/18/2012  . Hypogonadism male 02/18/2012  . Annual physical exam 02/18/2012    Social History   Tobacco Use  . Smoking status: Former Smoker    Quit date: 07/17/1995    Years since quitting: 25.2  . Smokeless tobacco: Never Used  Substance Use Topics  .  Alcohol use: No     Current Outpatient Medications:  .  cetirizine (ZYRTEC ALLERGY) 10 MG tablet, Take 1 tablet (10 mg total) by mouth daily., Disp: 30 tablet, Rfl: 0 .  doxycycline (VIBRAMYCIN) 100 MG capsule, Take 1 capsule (100 mg total) by mouth 2 (two) times daily., Disp: 20 capsule, Rfl: 0 .  fluticasone (FLONASE) 50 MCG/ACT nasal spray, Place 2 sprays into both nostrils daily., Disp: 9.9 g, Rfl: 0 .  fenofibrate 160 MG tablet, TAKE 1 TABLET BY MOUTH EVERY DAY, Disp: 90 tablet, Rfl: 3 .  Multiple Vitamin (MULTIVITAMIN) tablet, Take 1 tablet by mouth daily., Disp: , Rfl:  .  omega-3 acid ethyl esters (LOVAZA) 1 g capsule, Take 2 capsules (2 g total) by mouth 2 (two) times daily., Disp: 360 capsule, Rfl: 3 .  oseltamivir (TAMIFLU) 75 MG capsule, Take 1 capsule (75 mg total) by mouth daily. For 14 days., Disp: 14 capsule, Rfl: 0 .  phentermine (ADIPEX-P) 37.5 MG tablet, Take 0.5 tablets (18.75 mg total) by mouth daily before breakfast., Disp: 45 tablet, Rfl: 0 .  tadalafil (CIALIS) 5 MG tablet, TAKE 1 TABLET BY MOUTH EVERY DAY, Disp: 30 tablet, Rfl: 5 .  tamsulosin (FLOMAX) 0.4 MG CAPS capsule, TAKE 1 CAPSULE BY MOUTH TWICE A DAY, Disp: 180 capsule, Rfl: 1 .  topiramate (TOPAMAX) 50 MG tablet, TAKE 1 TABLET BY MOUTH EVERYDAY AT BEDTIME, Disp: 90 tablet, Rfl: 1   Allergies  Allergen Reactions  . Amoxicillin Shortness Of Breath and Other (See Comments)    Flushing Did it involve swelling of the face/tongue/throat, SOB, or low BP? Yes Did it involve sudden or severe rash/hives, skin peeling, or any reaction on the inside of your mouth or nose? No Did you need to seek medical attention at a hospital or doctor's office? Yes When did it last happen?8 years ago If all above answers are "NO", may proceed with cephalosporin use.  . Statins Other (See Comments)    Made pt feel loopy     Review of Systems  Constitutional: Negative for chills and fever.  HENT: Positive for congestion and  sinus pain. Negative for ear pain and sore throat.   Eyes: Negative for blurred vision and double vision.  Respiratory: Positive for cough. Negative for shortness of breath and wheezing.   Cardiovascular: Negative for chest pain, palpitations and leg swelling.  Gastrointestinal: Negative for abdominal pain, diarrhea, nausea and vomiting.  Genitourinary: Negative for dysuria.  Musculoskeletal: Negative for myalgias.  Skin: Negative for rash.  Neurological: Positive for headaches ( frontal). Negative for loss of consciousness and weakness.  Psychiatric/Behavioral: The patient is not nervous/anxious.    See HPI for history of present illness.  Physical Exam Constitutional:      General: He is not in acute distress.    Appearance: Normal appearance. He is well-developed. He is not ill-appearing.  HENT:     Head: Normocephalic and atraumatic.     Nose: Congestion present.  Mouth/Throat:     Mouth: Mucous membranes are moist.  Eyes:     General: No scleral icterus.    Conjunctiva/sclera: Conjunctivae normal.  Pulmonary:     Effort: Pulmonary effort is normal.     Comments: Speaking in full sentences No accessory muscle usage Musculoskeletal:        General: Normal range of motion.     Cervical back: Normal range of motion.  Skin:    Coloration: Skin is not pale.  Neurological:     General: No focal deficit present.     Mental Status: He is alert.  Psychiatric:        Mood and Affect: Mood normal.               A&P  1. Acute sinusitis, recurrence not specified, unspecified location  - doxycyline - complete the entire course of antibiotics  2. Seasonal allergies  - add Zyrtec and Flonase to your daily medications for the next 30 days   Patient voiced understanding and agreement to plan.   Time:   Today, I have spent 15 minutes with the patient with telehealth technology discussing the above problems, reviewing the chart, previous notes, medications and orders.     Tests Ordered: No orders of the defined types were placed in this encounter.   Medication Changes: Meds ordered this encounter  Medications  . fluticasone (FLONASE) 50 MCG/ACT nasal spray    Sig: Place 2 sprays into both nostrils daily.    Dispense:  9.9 g    Refill:  0  . cetirizine (ZYRTEC ALLERGY) 10 MG tablet    Sig: Take 1 tablet (10 mg total) by mouth daily.    Dispense:  30 tablet    Refill:  0  . doxycycline (VIBRAMYCIN) 100 MG capsule    Sig: Take 1 capsule (100 mg total) by mouth 2 (two) times daily.    Dispense:  20 capsule    Refill:  0     Disposition:  Follow up your PCP in 3-5 days if no improvement.  Leanor Kail Briauna Gilmartin, PA-C  10/17/2020 11:22 AM

## 2020-10-17 NOTE — Patient Instructions (Signed)
1. Acute sinusitis, recurrence not specified, unspecified location  - doxycyline - complete the entire course of antibiotics  2. Seasonal allergies  - add Zyrtec and Flonase to your daily medications for the next 30 days

## 2020-10-20 DIAGNOSIS — J019 Acute sinusitis, unspecified: Secondary | ICD-10-CM

## 2020-10-21 MED ORDER — AZITHROMYCIN 250 MG PO TABS: ORAL_TABLET | ORAL | 0 refills | Status: DC

## 2020-10-21 NOTE — Telephone Encounter (Signed)
I spent 5 total minutes of online digital evaluation and management services. 

## 2020-10-25 ENCOUNTER — Other Ambulatory Visit: Payer: Self-pay | Admitting: Sports Medicine

## 2020-10-25 DIAGNOSIS — N529 Male erectile dysfunction, unspecified: Secondary | ICD-10-CM

## 2020-10-29 ENCOUNTER — Other Ambulatory Visit: Payer: Self-pay | Admitting: Sports Medicine

## 2020-12-11 ENCOUNTER — Other Ambulatory Visit: Payer: Self-pay | Admitting: Sports Medicine

## 2020-12-11 DIAGNOSIS — E6609 Other obesity due to excess calories: Secondary | ICD-10-CM

## 2021-01-13 DIAGNOSIS — E6609 Other obesity due to excess calories: Secondary | ICD-10-CM

## 2021-01-13 MED ORDER — TOPIRAMATE 50 MG PO TABS: 50.0000 mg | ORAL_TABLET | Freq: Every day | ORAL | 1 refills | Status: DC

## 2021-01-18 ENCOUNTER — Encounter: Payer: Self-pay | Admitting: Sports Medicine

## 2021-01-18 ENCOUNTER — Ambulatory Visit (INDEPENDENT_AMBULATORY_CARE_PROVIDER_SITE_OTHER): Payer: Self-pay | Admitting: Sports Medicine

## 2021-01-18 ENCOUNTER — Other Ambulatory Visit: Payer: Self-pay

## 2021-01-18 VITALS — BP 127/83 | HR 88 | Temp 98.1°F | Resp 20 | Ht 69.5 in | Wt 252.0 lb

## 2021-01-18 DIAGNOSIS — Z0000 Encounter for general adult medical examination without abnormal findings: Secondary | ICD-10-CM

## 2021-01-18 DIAGNOSIS — N139 Obstructive and reflux uropathy, unspecified: Secondary | ICD-10-CM

## 2021-01-18 DIAGNOSIS — E6609 Other obesity due to excess calories: Secondary | ICD-10-CM

## 2021-01-18 DIAGNOSIS — Z8249 Family history of ischemic heart disease and other diseases of the circulatory system: Secondary | ICD-10-CM | POA: Insufficient documentation

## 2021-01-18 DIAGNOSIS — Z23 Encounter for immunization: Secondary | ICD-10-CM

## 2021-01-18 DIAGNOSIS — E783 Hyperchylomicronemia: Secondary | ICD-10-CM

## 2021-01-18 DIAGNOSIS — Z1211 Encounter for screening for malignant neoplasm of colon: Secondary | ICD-10-CM

## 2021-01-18 NOTE — Progress Notes (Signed)
Subjective:    CC: Annual Physical Exam  HPI:  This patient is here for their annual physical  I reviewed the past medical history, family history, social history, surgical history, and allergies today and no changes were needed.  Please see the problem list section below in epic for further details.  Past Medical History: Past Medical History:  Diagnosis Date   History of kidney stones    Past Surgical History: Past Surgical History:  Procedure Laterality Date   LUMBAR LAMINECTOMY/DECOMPRESSION MICRODISCECTOMY N/A 12/31/2018   Procedure: LUMBAR 2 - LUMBAR 5 DECOMPRESSION;  Surgeon: Phylliss Bob, MD;  Location: [facility];  Service: Orthopedics;  Laterality: N/A;   TONSILLECTOMY     Social History: Social History   Socioeconomic History   Marital status: Married    Spouse name: Not on file   Number of children: Not on file   Years of education: Not on file   Highest education level: Not on file  Occupational History   Not on file  Tobacco Use   Smoking status: Former    Pack years: 0.00    Types: Cigarettes    Quit date: 07/17/1995    Years since quitting: 25.5   Smokeless tobacco: Never  Substance and Sexual Activity   Alcohol use: No   Drug use: No   Sexual activity: Yes    Birth control/protection: None  Other Topics Concern   Not on file  Social History Narrative   Not on file   Social Determinants of Health   Financial Resource Strain: Not on file  Food Insecurity: Not on file  Transportation Needs: Not on file  Physical Activity: Not on file  Stress: Not on file  Social Connections: Not on file   Family History: Family History  Problem Relation Age of Onset   Cancer Mother    Cancer Father    Heart disease Sister    Diabetes Sister    Hyperlipidemia Sister    Hypertension Sister    Heart disease Brother    Diabetes Brother    Hyperlipidemia Brother    Hypertension Brother    Allergies: Allergies  Allergen Reactions   Amoxicillin Shortness  Of Breath and Other (See Comments)    Flushing Did it involve swelling of the face/tongue/throat, SOB, or low BP? Yes Did it involve sudden or severe rash/hives, skin peeling, or any reaction on the inside of your mouth or nose? No Did you need to seek medical attention at a hospital or doctor's office? Yes When did it last happen? 8 years ago If all above answers are "NO", may proceed with cephalosporin use.   Statins Other (See Comments)    Made pt feel loopy   Medications: See med rec.  Review of Systems: No headache, visual changes, nausea, vomiting, diarrhea, constipation, dizziness, abdominal pain, skin rash, fevers, chills, night sweats, swollen lymph nodes, weight loss, chest pain, body aches, joint swelling, muscle aches, shortness of breath, mood changes, visual or auditory hallucinations.  Objective:    General: Well Developed, well nourished, and in no acute distress.  Neuro: Alert and oriented x3, extra-ocular muscles intact, sensation grossly intact. Cranial nerves II through XII are intact, motor, sensory, and coordinative functions are all intact. HEENT: Normocephalic, atraumatic, pupils equal round reactive to light, neck supple, no masses, no lymphadenopathy, thyroid nonpalpable. Oropharynx, nasopharynx, external ear canals are unremarkable. Skin: Warm and dry, no rashes noted.  There are a few scattered seborrheic keratoses and skin tags on his back. Cardiac: Regular  rate and rhythm, no murmurs rubs or gallops.  Respiratory: Clear to auscultation bilaterally. Not using accessory muscles, speaking in full sentences.  Abdominal: Soft, nontender, nondistended, positive bowel sounds, no masses, no organomegaly.  Musculoskeletal: Shoulder, elbow, wrist, hip, knee, ankle stable, and with full range of motion.  Impression and Recommendations:    The patient was counselled, risk factors were discussed, anticipatory guidance given.  Annual physical exam Annual physical as  above, last colonoscopy in 2009 so we will do Cologuard. Shingrix #1 today, return in 2 to 6 months and a nurse visit for #2. Routine labs ordered.   Family history of abdominal aortic aneurysm Strong family history, ordering aortic ultrasound.   ___________________________________________ [name]. Dianah Field, M.D., ABFM., CAQSM. Primary Care and Sports Medicine [facility]  Adjunct Professor of [employer]

## 2021-01-18 NOTE — Assessment & Plan Note (Signed)
Strong family history, ordering aortic ultrasound.

## 2021-01-18 NOTE — Addendum Note (Signed)
Addended by: Lynnea Ferrier R on: 01/18/2021 10:24 AM   Modules accepted: Orders

## 2021-01-18 NOTE — Assessment & Plan Note (Signed)
Annual physical as above, last colonoscopy in 2009 so we will do Cologuard. Shingrix #1 today, return in 2 to 6 months and a nurse visit for #2. Routine labs ordered.

## 2021-01-19 ENCOUNTER — Other Ambulatory Visit: Payer: Self-pay

## 2021-01-19 ENCOUNTER — Emergency Department (INDEPENDENT_AMBULATORY_CARE_PROVIDER_SITE_OTHER)
Admission: EM | Admit: 2021-01-19 | Discharge: 2021-01-19 | Disposition: A | Discharge status: Home / Self Care | Payer: PRIVATE HEALTH INSURANCE | Source: Home / Self Care | Attending: Family Medicine | Admitting: Family Medicine

## 2021-01-19 ENCOUNTER — Emergency Department (INDEPENDENT_AMBULATORY_CARE_PROVIDER_SITE_OTHER): Payer: PRIVATE HEALTH INSURANCE

## 2021-01-19 ENCOUNTER — Emergency Department: Admit: 2021-01-19 | Discharge status: Absent without leave | Payer: Self-pay

## 2021-01-19 DIAGNOSIS — M7741 Metatarsalgia, right foot: Secondary | ICD-10-CM

## 2021-01-19 DIAGNOSIS — M25571 Pain in right ankle and joints of right foot: Secondary | ICD-10-CM | POA: Diagnosis not present

## 2021-01-19 LAB — COMPREHENSIVE METABOLIC PANEL
AG Ratio: 1.9 (calc) (ref 1.0–2.5)
ALT: 29 U/L (ref 9–46)
AST: 23 U/L (ref 10–35)
Albumin: 4.3 g/dL (ref 3.6–5.1)
Alkaline phosphatase (APISO): 54 U/L (ref 35–144)
BUN/Creatinine Ratio: 21 (calc) (ref 6–22)
BUN: 26 mg/dL — ABNORMAL HIGH (ref 7–25)
CO2: 23 mmol/L (ref 20–32)
Calcium: 9.4 mg/dL (ref 8.6–10.3)
Chloride: 110 mmol/L (ref 98–110)
Creat: 1.26 mg/dL — ABNORMAL HIGH (ref 0.70–1.25)
Globulin: 2.3 g/dL (calc) (ref 1.9–3.7)
Glucose, Bld: 95 mg/dL (ref 65–99)
Potassium: 4.4 mmol/L (ref 3.5–5.3)
Sodium: 142 mmol/L (ref 135–146)
Total Bilirubin: 0.5 mg/dL (ref 0.2–1.2)
Total Protein: 6.6 g/dL (ref 6.1–8.1)

## 2021-01-19 LAB — CBC
HCT: 48.4 % (ref 38.5–50.0)
Hemoglobin: 16.2 g/dL (ref 13.2–17.1)
MCH: 30.9 pg (ref 27.0–33.0)
MCHC: 33.5 g/dL (ref 32.0–36.0)
MCV: 92.2 fL (ref 80.0–100.0)
MPV: 11.1 fL (ref 7.5–12.5)
Platelets: 228 10*3/uL (ref 140–400)
RBC: 5.25 10*6/uL (ref 4.20–5.80)
RDW: 13 % (ref 11.0–15.0)
WBC: 4.9 10*3/uL (ref 3.8–10.8)

## 2021-01-19 LAB — PSA, TOTAL AND FREE
PSA, % Free: 50 % (calc) (ref >25)
PSA, Free: 0.1 ng/mL
PSA, Total: 0.2 ng/mL (ref <=4.0)

## 2021-01-19 LAB — LIPID PANEL
Cholesterol: 214 mg/dL — ABNORMAL HIGH (ref <200)
HDL: 41 mg/dL (ref >=40)
LDL Cholesterol (Calc): 134 mg/dL (calc) — ABNORMAL HIGH
Non-HDL Cholesterol (Calc): 173 mg/dL (calc) — ABNORMAL HIGH (ref <130)
Total CHOL/HDL Ratio: 5.2 (calc) — ABNORMAL HIGH (ref <5.0)
Triglycerides: 256 mg/dL — ABNORMAL HIGH (ref <150)

## 2021-01-19 LAB — TSH: TSH: 2.49 mIU/L (ref 0.40–4.50)

## 2021-01-19 MED ORDER — PREDNISONE 20 MG PO TABS: 20.0000 mg | ORAL_TABLET | Freq: Two times a day (BID) | ORAL | 0 refills | Status: DC

## 2021-01-19 NOTE — ED Triage Notes (Signed)
Pt presents to Urgent Care with c/o R lateral foot pain and swelling x 2-3 days. Does not recall injury, but states he got a cramp in his L lower leg and foot on 01/16/21 while playing with kids in the pool.

## 2021-01-19 NOTE — Discharge Instructions (Addendum)
Walk as little as possible Elevate foot and use ice.  20 minutes every 2-4 hours Stop Mobic while you are on prednisone Take 2 prednisone a day for 5 days.  Take 2 doses today If not improving by next week, consult your family doctor or we can refer you to orthopedics/podiatry

## 2021-01-19 NOTE — ED Provider Notes (Signed)
[facility]    CSN: [id number] Arrival date & time: 01/19/21  1912      History   Chief Complaint Chief Complaint  Patient presents with   Foot Pain    Right    HPI Richard Gross is a 60 y.o. male.   HPI  Patient has an increased amount of walking at work.  He wears work boots.  He has lateral foot pain.  He did not have any accident or injury.  It hurts especially to go up and down stairs.  No prior foot problems.  No gout.  He is on meloxicam 15 mg a day for chronic back pain.  Past Medical History:  Diagnosis Date   History of kidney stones     Patient Active Problem List   Diagnosis Date Noted   Family history of abdominal aortic aneurysm 01/18/2021   Skin lesion of face 04/24/2019   Mass of breast, left 02/19/2019   Haglund's deformity of both heels 02/19/2019   Mass of leg, left 11/15/2017   Lumbar degenerative disc disease 06/03/2017   Obstructive uropathy 05/10/2017   Kidney stone on left side 12/31/2016   Rotator cuff impingement syndrome, right 07/17/2016   Primary osteoarthritis of left knee 06/19/2016   Bell's palsy 12/01/2012   Obesity 12/01/2012   Hyperlipidemia 03/24/2012   Cervical radiculopathy 02/18/2012   Lumbar spinal stenosis 02/18/2012   Erectile dysfunction 02/18/2012   Hypogonadism male 02/18/2012   Annual physical exam 02/18/2012    Past Surgical History:  Procedure Laterality Date   LUMBAR LAMINECTOMY/DECOMPRESSION MICRODISCECTOMY N/A 12/31/2018   Procedure: LUMBAR 2 - LUMBAR 5 DECOMPRESSION;  Surgeon: Phylliss Bob, MD;  Location: [facility];  Service: Orthopedics;  Laterality: N/A;   TONSILLECTOMY         Home Medications    Prior to Admission medications   Medication Sig Start Date End Date Taking? Authorizing Provider  predniSONE (DELTASONE) 20 MG tablet Take 1 tablet (20 mg total) by mouth 2 (two) times daily with a meal. 01/19/21  Yes Raylene Everts, MD  fenofibrate 160 MG tablet TAKE 1 TABLET BY MOUTH EVERY DAY  10/31/20   Silverio Decamp, MD  fluticasone (FLONASE) 50 MCG/ACT nasal spray Place 2 sprays into both nostrils daily. 10/17/20   Muthersbaugh, Jarrett Soho, PA-C  meloxicam (MOBIC) 15 MG tablet Take 1 tablet by mouth daily. 10/06/20   [provider]  Multiple Vitamin (MULTIVITAMIN) tablet Take 1 tablet by mouth daily.    [provider]  tadalafil (CIALIS) 5 MG tablet TAKE 1 TABLET BY MOUTH EVERY DAY 10/25/20   Silverio Decamp, MD  topiramate (TOPAMAX) 50 MG tablet Take 1 tablet (50 mg total) by mouth at bedtime. 01/13/21   Silverio Decamp, MD    Family History Family History  Problem Relation Age of Onset   Cancer Mother    AAA (abdominal aortic aneurysm) Mother    AAA (abdominal aortic aneurysm) Father    Cancer Father    Heart disease Sister    Diabetes Sister    Hyperlipidemia Sister    Hypertension Sister    Heart disease Brother    Diabetes Brother    Hyperlipidemia Brother    Hypertension Brother     Social History Social History   Tobacco Use   Smoking status: Former    Pack years: 0.00    Types: Cigarettes    Quit date: 07/17/1995    Years since quitting: 25.5   Smokeless tobacco: Never  Vaping  Use   Vaping Use: Never used  Substance Use Topics   Alcohol use: No   Drug use: No     Allergies   Amoxicillin and Statins   Review of Systems Review of Systems See HPI  Physical Exam Triage Vital Signs ED Triage Vitals  Enc Vitals Group     BP 01/19/21 1933 131/79     Pulse Rate 01/19/21 1933 90     Resp 01/19/21 1933 20     Temp 01/19/21 1933 98.4 F (36.9 C)     Temp Source 01/19/21 1933 Oral     SpO2 01/19/21 1933 97 %     Weight 01/19/21 1929 250 lb (113.4 kg)     Height 01/19/21 1929 5\' 9"  (1.753 m)     Head Circumference --      Peak Flow --      Pain Score 01/19/21 1929 6     Pain Loc --      Pain Edu? --      Excl. in [place]? --    No data found.  Updated Vital Signs BP 131/79 (BP Location: Right Arm)   Pulse 90    Temp 98.4 F (36.9 C) (Oral)   Resp 20   Ht 5\' 9"  (1.753 m)   Wt 113.4 kg   SpO2 97%   BMI 36.92 kg/m       Physical Exam Constitutional:      General: He is not in acute distress.    Appearance: He is well-developed.  HENT:     Head: Normocephalic and atraumatic.     Nose:     Comments: Mask is in place Eyes:     Conjunctiva/sclera: Conjunctivae normal.     Pupils: Pupils are equal, round, and reactive to light.  Cardiovascular:     Rate and Rhythm: Normal rate.  Pulmonary:     Effort: Pulmonary effort is normal. No respiratory distress.  Abdominal:     General: There is no distension.     Palpations: Abdomen is soft.  Musculoskeletal:        General: Normal range of motion.     Cervical back: Normal range of motion.       Feet:  Feet:     Comments: Point tenderness.  No swelling or discoloration Skin:    General: Skin is warm and dry.  Neurological:     Mental Status: He is alert.     UC Treatments / Results  Labs (all labs ordered are listed, but only abnormal results are displayed) Labs Reviewed - No data to display  EKG   Radiology DG Foot Complete Right  Result Date: 01/19/2021 CLINICAL DATA:  Fifth metatarsal pain. EXAM: RIGHT FOOT COMPLETE - 3+ VIEW COMPARISON:  None. FINDINGS: No evidence of fracture, dislocation, or joint effusion. No evidence of severe arthropathy. No aggressive appearing focal bone abnormality. Soft tissues are unremarkable. IMPRESSION: Negative. Electronically Signed   By: Iven Finn M.D.   On: 01/19/2021 20:08    Procedures Procedures (including critical care time)  Medications Ordered in UC Medications - No data to display  Initial Impression / Assessment and Plan / UC Course  I have reviewed the triage vital signs and the nursing notes.  Pertinent labs & imaging results that were available during my care of the patient were reviewed by me and considered in my medical decision making (see chart for details).      Metatarsal pain.  Discussed occult fracture is possible.  Recommend  limit weightbearing, fracture shoe, prednisone for 5 days.  Hold meloxicam while on prednisone.  See podiatrist orthopedist if fails to improve.  Return as needed Final Clinical Impressions(s) / UC Diagnoses   Final diagnoses:  Metatarsalgia of right foot     Discharge Instructions      Walk as little as possible Elevate foot and use ice.  20 minutes every 2-4 hours Stop Mobic while you are on prednisone Take 2 prednisone a day for 5 days.  Take 2 doses today If not improving by next week, consult your family doctor or we can refer you to orthopedics/podiatry   ED Prescriptions     Medication Sig Dispense Auth. Provider   predniSONE (DELTASONE) 20 MG tablet Take 1 tablet (20 mg total) by mouth 2 (two) times daily with a meal. 10 tablet Raylene Everts, MD      PDMP not reviewed this encounter.   Raylene Everts, MD 01/19/21 2129

## 2021-01-26 ENCOUNTER — Other Ambulatory Visit: Payer: Self-pay

## 2021-01-26 ENCOUNTER — Ambulatory Visit (INDEPENDENT_AMBULATORY_CARE_PROVIDER_SITE_OTHER): Payer: PRIVATE HEALTH INSURANCE

## 2021-01-26 DIAGNOSIS — Z8249 Family history of ischemic heart disease and other diseases of the circulatory system: Secondary | ICD-10-CM

## 2021-02-23 ENCOUNTER — Telehealth: Payer: PRIVATE HEALTH INSURANCE | Admitting: Physician Assistant

## 2021-02-23 ENCOUNTER — Encounter: Payer: Self-pay | Admitting: Physician Assistant

## 2021-02-23 DIAGNOSIS — Z20822 Contact with and (suspected) exposure to covid-19: Secondary | ICD-10-CM

## 2021-02-23 DIAGNOSIS — B9689 Other specified bacterial agents as the cause of diseases classified elsewhere: Secondary | ICD-10-CM

## 2021-02-23 DIAGNOSIS — J019 Acute sinusitis, unspecified: Secondary | ICD-10-CM

## 2021-02-23 MED ORDER — AZITHROMYCIN 250 MG PO TABS: ORAL_TABLET | ORAL | 0 refills | Status: DC

## 2021-02-23 MED ORDER — DOXYCYCLINE HYCLATE 100 MG PO TABS: 100.0000 mg | ORAL_TABLET | Freq: Two times a day (BID) | ORAL | 0 refills | Status: DC

## 2021-02-23 NOTE — Progress Notes (Signed)
Virtual Visit Consent   Richard Gross, you are scheduled for a virtual visit with a [facility] provider today.     Just as with appointments in the office, your consent must be obtained to participate.  Your consent will be active for this visit and any virtual visit you may have with one of our providers in the next 365 days.     If you have a MyChart account, a copy of this consent can be sent to you electronically.  All virtual visits are billed to your insurance company just like a traditional visit in the office.    As this is a virtual visit, video technology does not allow for your provider to perform a traditional examination.  This may limit your provider's ability to fully assess your condition.  If your provider identifies any concerns that need to be evaluated in person or the need to arrange testing (such as labs, EKG, etc.), we will make arrangements to do so.     Although advances in technology are sophisticated, we cannot ensure that it will always work on either your end or our end.  If the connection with a video visit is poor, the visit may have to be switched to a telephone visit.  With either a video or telephone visit, we are not always able to ensure that we have a secure connection.     I need to obtain your verbal consent now.   Are you willing to proceed with your visit today?    Richard Gross has provided verbal consent on 02/23/2021 for a virtual visit (video or telephone).   Mar Daring, PA-C   Date: 02/23/2021 10:44 AM   Virtual Visit via Video Note   I, Mar Daring, connected with  Richard Gross  ([id number], Oct 05, 1960) on 02/23/21 at 10:30 AM EDT by a video-enabled telemedicine application and verified that I am speaking with the correct person using two identifiers.  Location: Patient: Virtual Visit Location Patient: Home Provider: Virtual Visit Location Provider: Home Office   I discussed the limitations of evaluation and management by  telemedicine and the availability of in person appointments. The patient expressed understanding and agreed to proceed.    History of Present Illness: Richard Gross is a 60 y.o. who identifies as a male who was assigned male at birth, and is being seen today for sinus symptoms and cough.  HPI: Sinusitis This is a new problem. Episode onset: two days ago symptoms started with sneezing. Maximum temperature: low grade. The fever has been present for Less than 1 day. The pain is mild. Associated symptoms include congestion, coughing (productive, yellow tinge), headaches, sinus pressure, sneezing and a sore throat (scratchy). Pertinent negatives include no chills, diaphoresis, ear pain or shortness of breath. Treatments tried: Mucinex DM. The treatment provided no relief.      Problems:  Patient Active Problem List   Diagnosis Date Noted   Family history of abdominal aortic aneurysm 01/18/2021   Skin lesion of face 04/24/2019   Mass of breast, left 02/19/2019   Haglund's deformity of both heels 02/19/2019   Mass of leg, left 11/15/2017   Lumbar degenerative disc disease 06/03/2017   Obstructive uropathy 05/10/2017   Kidney stone on left side 12/31/2016   Rotator cuff impingement syndrome, right 07/17/2016   Primary osteoarthritis of left knee 06/19/2016   Bell's palsy 12/01/2012   Obesity 12/01/2012   Hyperlipidemia 03/24/2012   Cervical radiculopathy 02/18/2012   Lumbar spinal stenosis 02/18/2012  Erectile dysfunction 02/18/2012   Hypogonadism male 02/18/2012   Annual physical exam 02/18/2012    Allergies:  Allergies  Allergen Reactions   Amoxicillin Shortness Of Breath and Other (See Comments)    Flushing Did it involve swelling of the face/tongue/throat, SOB, or low BP? Yes Did it involve sudden or severe rash/hives, skin peeling, or any reaction on the inside of your mouth or nose? No Did you need to seek medical attention at a hospital or doctor's office? Yes When did it last  happen? 8 years ago If all above answers are "NO", may proceed with cephalosporin use.   Statins Other (See Comments)    Made pt feel loopy   Medications:  Current Outpatient Medications:    fenofibrate 160 MG tablet, TAKE 1 TABLET BY MOUTH EVERY DAY, Disp: 90 tablet, Rfl: 3   fluticasone (FLONASE) 50 MCG/ACT nasal spray, Place 2 sprays into both nostrils daily., Disp: 9.9 g, Rfl: 0   meloxicam (MOBIC) 15 MG tablet, Take 1 tablet by mouth daily., Disp: , Rfl:    Multiple Vitamin (MULTIVITAMIN) tablet, Take 1 tablet by mouth daily., Disp: , Rfl:    predniSONE (DELTASONE) 20 MG tablet, Take 1 tablet (20 mg total) by mouth 2 (two) times daily with a meal., Disp: 10 tablet, Rfl: 0   tadalafil (CIALIS) 5 MG tablet, TAKE 1 TABLET BY MOUTH EVERY DAY, Disp: 30 tablet, Rfl: 5   topiramate (TOPAMAX) 50 MG tablet, Take 1 tablet (50 mg total) by mouth at bedtime., Disp: 90 tablet, Rfl: 1  Observations/Objective: Patient is well-developed, well-nourished in no acute distress.  Resting comfortably  at home.  Head is normocephalic, atraumatic.  No labored breathing.  Speech is clear and coherent with logical content.  Patient is alert and oriented at baseline.    Assessment and Plan: 1. Suspected COVID-19 virus infection - Advised to take covid test - Call back with testing results and will treat accordingly - Information on covid provided via AVS  Follow Up Instructions: I discussed the assessment and treatment plan with the patient. The patient was provided an opportunity to ask questions and all were answered. The patient agreed with the plan and demonstrated an understanding of the instructions.  A copy of instructions were sent to the patient via MyChart.  The patient was advised to call back or seek an in-person evaluation if the symptoms worsen or if the condition fails to improve as anticipated.  Time:  I spent 12 minutes with the patient via telehealth technology discussing the above  problems/concerns.    Mar Daring, PA-C

## 2021-02-23 NOTE — Patient Instructions (Signed)
COVID-19: What to Do if You Are Sick CDC has updated isolation and quarantine recommendations for the public, and is revising the CDC website to reflect these changes. These recommendations do not apply to healthcare personnel and do not supersede state, local, tribal, or territorial laws, rules, andregulations. If you have a fever, cough or other symptoms, you might have COVID-19. Most people have mild illness and are able to recover at home. If you are sick: Keep track of your symptoms. If you have an emergency warning sign (including trouble breathing), call 911. Steps to help prevent the spread of COVID-19 if you are sick If you are sick with COVID-19 or think you might have COVID-19, follow the steps below to care for yourself and to help protect other peoplein your home and community. Stay home except to get medical care Stay home. Most people with COVID-19 have mild illness and can recover at home without medical care. Do not leave your home, except to get medical care. Do not visit public areas. Take care of yourself. Get rest and stay hydrated. Take over-the-counter medicines, such as acetaminophen, to help you feel better. Stay in touch with your doctor. Call before you get medical care. Be sure to get care if you have trouble breathing, or have any other emergency warning signs, or if you think it is an emergency. Avoid public transportation, ride-sharing, or taxis. Separate yourself from other people As much as possible, stay in a specific room and away from other people and pets in your home. If possible, you should use a separate bathroom. If you need to be around other people or animals in oroutside of the home, wear a mask. Tell your close contactsthat they may have been exposed to COVID-19. An infected person can spread COVID-19 starting 48 hours (or 2 days) before the person has any symptoms or tests positive. By letting your close contacts know they may have been exposed to COVID-19,  you are helping to protect everyone. Additional guidance is available for those living in close quarters and shared housing. See COVID-19 and Animals if you have questions about pets. If you are diagnosed with COVID-19, someone from the health department may call you. Answer the call to slow the spread. Monitor your symptoms Symptoms of COVID-19 include fever, cough, or other symptoms. Follow care instructions from your healthcare provider and local health department. Your local health authorities may give instructions on checking your symptoms and reporting information. When to seek emergency medical attention Look for emergency warning signs* for COVID-19. If someone is showing any of these signs, seek emergency medical care immediately: Trouble breathing Persistent pain or pressure in the chest New confusion Inability to wake or stay awake Pale, gray, or blue-colored skin, lips, or nail beds, depending on skin tone *This list is not all possible symptoms. Please call your medical provider forany other symptoms that are severe or concerning to you. Call 911 or call ahead to your local emergency facility: Notify the operator that you are seeking care for someone who has or may haveCOVID-19. Call ahead before visiting your doctor Call ahead. Many medical visits for routine care are being postponed or done by phone or telemedicine. If you have a medical appointment that cannot be postponed, call your doctor's office, and tell them you have or may have COVID-19. This will help the office protect themselves and other patients. Get tested If you have symptoms of COVID-19, get tested. While waiting for test results, you stay away from others,  including staying apart from those living in your household. Self-tests are one of several options for testing for the virus that causes COVID-19 and may be more convenient than laboratory-based tests and point-of-care tests. Ask your healthcare provider or  your local health department if you need help interpreting your test results. You can visit your state, tribal, local, and territorial health department's website to look for the latest local information on testing sites. If you are sick, wear a mask over your nose and mouth You should wear a mask over your nose and mouth if you must be around other people or animals, including pets (even at home). You don't need to wear the mask if you are alone. If you can't put on a mask (because of trouble breathing, for example), cover your coughs and sneezes in some other way. Try to stay at least 6 feet away from other people. This will help protect the people around you. Masks should not be placed on young children under age 45 years, anyone who has trouble breathing, or anyone who is not able to remove the mask without help. Note: During the COVID-19 pandemic, medical grade facemasks are reserved forhealthcare workers and some first responders. Cover your coughs and sneezes Cover your mouth and nose with a tissue when you cough or sneeze. Throw away used tissues in a lined trash can. Immediately wash your hands with soap and water for at least 20 seconds. If soap and water are not available, clean your hands with an alcohol-based hand sanitizer that contains at least 60% alcohol. Clean your hands often Wash your hands often with soap and water for at least 20 seconds. This is especially important after blowing your nose, coughing, or sneezing; going to the bathroom; and before eating or preparing food. Use hand sanitizer if soap and water are not available. Use an alcohol-based hand sanitizer with at least 60% alcohol, covering all surfaces of your hands and rubbing them together until they feel dry. Soap and water are the best option, especially if hands are visibly dirty. Avoid touching your eyes, nose, and mouth with unwashed hands. Handwashing Tips Avoid sharing personal household items Do not share  dishes, drinking glasses, cups, eating utensils, towels, or bedding with other people in your home. Wash these items thoroughly after using them with soap and water or put in the dishwasher. Clean all "high-touch" surfaces every day Clean and disinfect high-touch surfaces in your "sick room" and bathroom; wear disposable gloves. Let someone else clean and disinfect surfaces in common areas, but you should clean your bedroom and bathroom, if possible. If a caregiver or other person needs to clean and disinfect a sick person's bedroom or bathroom, they should do so on an as-needed basis. The caregiver/other person should wear a mask and disposable gloves prior to cleaning. They should wait as long as possible after the person who is sick has used the bathroom before coming in to clean and use the bathroom. High-touch surfaces include phones, remote controls, counters, tabletops, doorknobs, bathroom fixtures, toilets, keyboards, tablets, and bedside tables. Clean and disinfect areas that may have blood, stool, or body fluids on them. Use household cleaners and disinfectants. Clean the area or item with soap and water or another detergent if it is dirty. Then, use a household disinfectant. Be sure to follow the instructions on the label to ensure safe and effective use of the product. Many products recommend keeping the surface wet for several minutes to ensure germs are killed. Many  also recommend precautions such as wearing gloves and making sure you have good ventilation during use of the product. Use a product from [employer] List N: Disinfectants for Coronavirus ([id number]). Complete Disinfection Guidance When you can be around others after being sick with COVID-19 Deciding when you can be around others is different for different situations. Find out when you can safely end home isolation. For any additional questions about your care,contact your healthcare provider or state or local health  department. 06/22/2020 Content source: [facility] ([facility]), Division of Viral Diseases This information is not intended to replace advice given to you by your health care provider. Make sure you discuss any questions you have with your healthcare provider. Document Revised: 08/19/2020 Document Reviewed: 08/19/2020 Elsevier Patient Education  [place].

## 2021-02-23 NOTE — Addendum Note (Signed)
Addended by: Mar Daring on: 02/23/2021 11:44 AM   Modules accepted: Orders

## 2021-04-14 ENCOUNTER — Ambulatory Visit (INDEPENDENT_AMBULATORY_CARE_PROVIDER_SITE_OTHER): Payer: PRIVATE HEALTH INSURANCE | Admitting: Sports Medicine

## 2021-04-14 ENCOUNTER — Other Ambulatory Visit: Payer: Self-pay

## 2021-04-14 DIAGNOSIS — M1712 Unilateral primary osteoarthritis, left knee: Secondary | ICD-10-CM | POA: Diagnosis not present

## 2021-04-14 DIAGNOSIS — Z23 Encounter for immunization: Secondary | ICD-10-CM | POA: Diagnosis not present

## 2021-04-14 MED ORDER — DICLOFENAC SODIUM 75 MG PO TBEC: 75.0000 mg | DELAYED_RELEASE_TABLET | Freq: Two times a day (BID) | ORAL | 3 refills | Status: DC

## 2021-04-14 NOTE — Progress Notes (Signed)
    Procedures performed today:    None.  Independent interpretation of notes and tests performed by another provider:   None.  Brief History, Exam, Impression, and Recommendations:    Primary osteoarthritis of left knee Left knee osteoarthritis, also has some prepatellar bursitis, historically well controlled after an injection in 2017 and then the switch from meloxicam to Celebrex in 2019. We do need to rotate NSAIDs occasionally, switching to Voltaren. Return to see me as needed.    Chronic process with exacerbation and pharmacologic intervention  ___________________________________________ [name]. Dianah Field, M.D., ABFM., CAQSM. Primary Care and [place] Instructor of [employer] [facility] [employer]

## 2021-04-14 NOTE — Assessment & Plan Note (Signed)
Left knee osteoarthritis, also has some prepatellar bursitis, historically well controlled after an injection in 2017 and then the switch from meloxicam to Celebrex in 2019. We do need to rotate NSAIDs occasionally, switching to Voltaren. Return to see me as needed.

## 2021-04-30 ENCOUNTER — Other Ambulatory Visit: Payer: Self-pay | Admitting: Sports Medicine

## 2021-04-30 DIAGNOSIS — N529 Male erectile dysfunction, unspecified: Secondary | ICD-10-CM

## 2021-07-13 ENCOUNTER — Other Ambulatory Visit: Payer: Self-pay | Admitting: Sports Medicine

## 2021-07-13 DIAGNOSIS — E6609 Other obesity due to excess calories: Secondary | ICD-10-CM

## 2021-07-20 ENCOUNTER — Other Ambulatory Visit: Payer: Self-pay

## 2021-07-20 ENCOUNTER — Encounter: Payer: Self-pay | Admitting: Podiatry

## 2021-07-20 ENCOUNTER — Ambulatory Visit (INDEPENDENT_AMBULATORY_CARE_PROVIDER_SITE_OTHER): Payer: PRIVATE HEALTH INSURANCE | Admitting: Podiatry

## 2021-07-20 DIAGNOSIS — L600 Ingrowing nail: Secondary | ICD-10-CM

## 2021-07-20 DIAGNOSIS — L603 Nail dystrophy: Secondary | ICD-10-CM

## 2021-07-20 NOTE — Progress Notes (Signed)
°  Subjective:  Patient ID: Richard Gross, male    DOB: 1961-04-03,   MRN: [id number]  Chief Complaint  Patient presents with   Nail Problem    np// L great nail problem, pt would like nail removed,     61 y.o. male presents for concern of left great toenail. Relates he recently hit the nail and the toenail has been loose and just hanging on barely with some pain. Relates a history of recurrent ingrown's and wound like to permanently remove this nail.  Denies any other pedal complaints. Denies n/v/f/c.   Past Medical History:  Diagnosis Date   History of kidney stones     Objective:  Physical Exam: Vascular: DP/PT pulses 2/4 bilateral. CFT <3 seconds. Normal hair growth on digits. No edema.  Skin. No lacerations or abrasions bilateral feet. Thickened discolored left great toe with incurvation of the lateral and medial nail borders. Nail loosened and attached at the very proximal aspect of the nail.  Musculoskeletal: MMT 5/5 bilateral lower extremities in DF, PF, Inversion and Eversion. Deceased ROM in DF of ankle joint.  Neurological: Sensation intact to light touch.   Assessment:   1. Onychodystrophy   2. Ingrown nail of great toe of left foot      Plan:  Patient was evaluated and treated and all questions answered. Patient requesting removal of ingrown nail today. Procedure below.  Discussed procedure and post procedure care and patient expressed understanding.  Will follow-up in 2 weeks for nail check or sooner if any problems arise.    Procedure:  Procedure: total Nail Avulsion of left hallux  Surgeon: Lorenda Peck, DPM  Pre-op Dx: Ingrown toenail without infection Post-op: Same  Place of Surgery: Office exam room.  Indications for surgery: Painful and ingrown toenail.    The patient is requesting removal of nail with chemical matrixectomy. Risks and complications were discussed with the patient for which they understand and written consent was obtained. Under sterile  conditions a total of 3 mL of 1:1 mixture 0.5% marcaine plain and 1% lidocaine plain was infiltrated in a hallux block fashion. Once anesthetized, the skin was prepped in sterile fashion. A tourniquet was then applied. Next the left hallux nail was removed.  Next phenol was then applied under standard conditions and copiously irrigated. Silvadene was applied. A dry sterile dressing was applied. After application of the dressing the tourniquet was removed and there is found to be an immediate capillary refill time to the digit. The patient tolerated the procedure well without any complications. Post procedure instructions were discussed the patient for which he verbally understood. Follow-up in two weeks for nail check or sooner if any problems are to arise. Discussed signs/symptoms of infection and directed to call the office immediately should any occur or go directly to the emergency room. In the meantime, encouraged to call the office with any questions, concerns, changes symptoms.   Lorenda Peck, DPM

## 2021-07-20 NOTE — Patient Instructions (Signed)

## 2021-08-03 ENCOUNTER — Ambulatory Visit: Payer: PRIVATE HEALTH INSURANCE | Admitting: Podiatry

## 2021-08-08 ENCOUNTER — Ambulatory Visit (INDEPENDENT_AMBULATORY_CARE_PROVIDER_SITE_OTHER): Payer: PRIVATE HEALTH INSURANCE

## 2021-08-08 ENCOUNTER — Ambulatory Visit (INDEPENDENT_AMBULATORY_CARE_PROVIDER_SITE_OTHER): Payer: PRIVATE HEALTH INSURANCE | Admitting: Sports Medicine

## 2021-08-08 ENCOUNTER — Other Ambulatory Visit: Payer: Self-pay

## 2021-08-08 DIAGNOSIS — M545 Low back pain, unspecified: Secondary | ICD-10-CM | POA: Diagnosis not present

## 2021-08-08 DIAGNOSIS — M21372 Foot drop, left foot: Secondary | ICD-10-CM

## 2021-08-08 DIAGNOSIS — M48062 Spinal stenosis, lumbar region with neurogenic claudication: Secondary | ICD-10-CM | POA: Diagnosis not present

## 2021-08-08 DIAGNOSIS — M21371 Foot drop, right foot: Secondary | ICD-10-CM | POA: Diagnosis not present

## 2021-08-08 MED ORDER — PREDNISONE 50 MG PO TABS: ORAL_TABLET | ORAL | 0 refills | Status: DC

## 2021-08-08 NOTE — Assessment & Plan Note (Signed)
This is a pleasant 61 year old male, he had some severe lumbar spinal stenosis, back in 2020 he had an L2-L5 laminectomy with facetectomy with Dr. Lynann Bologna. He did really well initially, he had no instability/spondylolisthesis before surgery. Unfortunately he has developed some spondylolisthesis on follow-up x-rays, more recently over the past several months he has started to develop increasing discomfort down the right leg with significant weakness of his right ankle. On exam he has moderate foot drop on the right. For this reason we will evaluate him aggressively, 5 days of prednisone, x-rays, lumbar spine MRI urgently, and I would like him to get back in with Dr. Lynann Bologna urgently.

## 2021-08-08 NOTE — Progress Notes (Signed)
° ° °  Procedures performed today:    None.  Independent interpretation of notes and tests performed by another provider:   None.  Brief History, Exam, Impression, and Recommendations:    Lumbar spinal stenosis This is a pleasant 61 year old male, he had some severe lumbar spinal stenosis, back in 2020 he had an L2-L5 laminectomy with facetectomy with Dr. Lynann Bologna. He did really well initially, he had no instability/spondylolisthesis before surgery. Unfortunately he has developed some spondylolisthesis on follow-up x-rays, more recently over the past several months he has started to develop increasing discomfort down the right leg with significant weakness of his right ankle. On exam he has moderate foot drop on the right. For this reason we will evaluate him aggressively, 5 days of prednisone, x-rays, lumbar spine MRI urgently, and I would like him to get back in with Dr. Lynann Bologna urgently.    ___________________________________________ [name]. Dianah Field, M.D., ABFM., CAQSM. Primary Care and [place] Instructor of [place] of [facility] of Medicine

## 2021-08-11 ENCOUNTER — Encounter: Payer: Self-pay | Admitting: Sports Medicine

## 2021-08-12 ENCOUNTER — Other Ambulatory Visit: Payer: Self-pay | Admitting: Sports Medicine

## 2021-08-12 DIAGNOSIS — M1712 Unilateral primary osteoarthritis, left knee: Secondary | ICD-10-CM

## 2021-08-14 ENCOUNTER — Other Ambulatory Visit: Payer: Self-pay | Admitting: Orthopedic Surgery

## 2021-08-28 NOTE — Pre-Procedure Instructions (Signed)
Surgical Instructions    Your procedure is scheduled on Thursday, February 16th.  Report to [facility] Main Entrance "A" at 05:30 A.M., then check in with the Admitting office.  Call this number if you have problems the morning of surgery:  [phone]   If you have any questions prior to your surgery date call [phone]: Open Monday-Friday 8am-4pm    Remember:  Do not eat after midnight the night before your surgery  You may drink clear liquids until 04:30 AM the morning of your surgery.   Clear liquids allowed are: Water, Non-Citrus Juices (without pulp), Carbonated Beverages, Clear Tea, Black Coffee Only (NO MILK, CREAM OR POWDERED CREAMER of any kind), and Gatorade.   Patient Instructions  The night before surgery:  No food after midnight. ONLY clear liquids after midnight  The day of surgery (if you do NOT have diabetes):  Drink ONE (1) Pre-Surgery Clear Ensure by 04:30 AM the morning of surgery. Drink in one sitting. Do not sip.  This drink was given to you during your hospital  pre-op appointment visit.  Nothing else to drink after completing the  Pre-Surgery Clear Ensure.          If you have questions, please contact your surgeons office.      Take these medicines the morning of surgery with A SIP OF WATER  fenofibrate acetaminophen (TYLENOL)- if needed  As of today, STOP taking any Aspirin (unless otherwise instructed by your surgeon) Aleve, Naproxen, Ibuprofen, Motrin, Advil, Goody's, BC's, all herbal medications, fish oil, and all vitamins. This includes your diclofenac (VOLTAREN).                     Do NOT Smoke (Tobacco/Vaping) for 24 hours prior to your procedure.  If you use a CPAP at night, you may bring your mask/headgear for your overnight stay.   Contacts, glasses, piercing's, hearing aid's, dentures or partials may not be worn into surgery, please bring cases for these belongings.    For patients admitted to the hospital, discharge time will  be determined by your treatment team.   Patients discharged the day of surgery will not be allowed to drive home, and someone needs to stay with them for 24 hours.  NO VISITORS WILL BE ALLOWED IN PRE-OP WHERE PATIENTS ARE PREPPED FOR SURGERY.  ONLY 1 SUPPORT PERSON MAY BE PRESENT IN THE WAITING ROOM WHILE YOU ARE IN SURGERY.  IF YOU ARE TO BE ADMITTED, ONCE YOU ARE IN YOUR ROOM YOU WILL BE ALLOWED TWO (2) VISITORS. (1) VISITOR MAY STAY OVERNIGHT BUT MUST ARRIVE TO THE ROOM BY 8pm.  Minor children may have two parents present. Special consideration for safety and communication needs will be reviewed on a case by case basis.   Special instructions:   [place]- Preparing For Surgery  Before surgery, you can play an important role. Because skin is not sterile, your skin needs to be as free of germs as possible. You can reduce the number of germs on your skin by washing with CHG (chlorahexidine gluconate) Soap before surgery.  CHG is an antiseptic cleaner which kills germs and bonds with the skin to continue killing germs even after washing.    Oral Hygiene is also important to reduce your risk of infection.  Remember - BRUSH YOUR TEETH THE MORNING OF SURGERY WITH YOUR REGULAR TOOTHPASTE  Please do not use if you have an allergy to CHG or antibacterial soaps. If your skin becomes reddened/irritated stop using  the CHG.  Do not shave (including legs and underarms) for at least 48 hours prior to first CHG shower. It is OK to shave your face.  Please follow these instructions carefully.   Shower the NIGHT BEFORE SURGERY and the MORNING OF SURGERY  If you chose to wash your hair, wash your hair first as usual with your normal shampoo.  After you shampoo, rinse your hair and body thoroughly to remove the shampoo.  Use CHG Soap as you would any other liquid soap. You can apply CHG directly to the skin and wash gently with a scrungie or a clean washcloth.   Apply the CHG Soap to your body ONLY FROM  THE NECK DOWN.  Do not use on open wounds or open sores. Avoid contact with your eyes, ears, mouth and genitals (private parts). Wash Face and genitals (private parts)  with your normal soap.   Wash thoroughly, paying special attention to the area where your surgery will be performed.  Thoroughly rinse your body with warm water from the neck down.  DO NOT shower/wash with your normal soap after using and rinsing off the CHG Soap.  Pat yourself dry with a CLEAN TOWEL.  Wear CLEAN PAJAMAS to bed the night before surgery  Place CLEAN SHEETS on your bed the night before your surgery  DO NOT SLEEP WITH PETS.   Day of Surgery: Shower with CHG soap. Do not wear jewelry Do not wear lotions, powders, colognes, or deodorant. Do not shave 48 hours prior to surgery.  Men may shave face and neck. Do not bring valuables to the hospital. [facility] is not responsible for any belongings or valuables. Wear Clean/Comfortable clothing the morning of surgery Remember to brush your teeth WITH YOUR REGULAR TOOTHPASTE.   Please read over the following fact sheets that you were given.   3 days prior to your procedure or After your COVID test   You are not required to quarantine however you are required to wear a well-fitting mask when you are out and around people not in your household. If your mask becomes wet or soiled, replace with a new one.   Wash your hands often with soap and water for 20 seconds or clean your hands with an alcohol-based hand sanitizer that contains at least 60% alcohol.   Do not share personal items.   Notify your provider:  o if you are in close contact with someone who has COVID  o or if you develop a fever of 100.4 or greater, sneezing, cough, sore throat, shortness of breath or body aches.

## 2021-08-29 ENCOUNTER — Other Ambulatory Visit: Payer: Self-pay

## 2021-08-29 ENCOUNTER — Encounter (HOSPITAL_COMMUNITY): Payer: Self-pay

## 2021-08-29 ENCOUNTER — Encounter (HOSPITAL_COMMUNITY)
Admission: RE | Admit: 2021-08-29 | Discharge: 2021-08-29 | Disposition: A | Discharge status: Home / Self Care | Payer: PRIVATE HEALTH INSURANCE | Source: Ambulatory Visit | Attending: Orthopedic Surgery | Admitting: Orthopedic Surgery

## 2021-08-29 VITALS — BP 145/80 | HR 88 | Temp 98.2°F | Resp 18 | Ht 69.0 in | Wt 255.9 lb

## 2021-08-29 DIAGNOSIS — Z20822 Contact with and (suspected) exposure to covid-19: Secondary | ICD-10-CM | POA: Insufficient documentation

## 2021-08-29 DIAGNOSIS — Z01812 Encounter for preprocedural laboratory examination: Secondary | ICD-10-CM | POA: Insufficient documentation

## 2021-08-29 DIAGNOSIS — Z01818 Encounter for other preprocedural examination: Secondary | ICD-10-CM

## 2021-08-29 HISTORY — DX: Other complications of anesthesia, initial encounter: T88.59XA

## 2021-08-29 HISTORY — DX: Other specified postprocedural states: R11.2

## 2021-08-29 HISTORY — DX: Other specified postprocedural states: Z98.890

## 2021-08-29 LAB — BASIC METABOLIC PANEL
Anion gap: 7 (ref 5–15)
BUN: 22 mg/dL (ref 8–23)
CO2: 24 mmol/L (ref 22–32)
Calcium: 9.6 mg/dL (ref 8.9–10.3)
Chloride: 111 mmol/L (ref 98–111)
Creatinine, Ser: 1.15 mg/dL (ref 0.61–1.24)
GFR, Estimated: >60 mL/min (ref >60)
Glucose, Bld: 109 mg/dL — ABNORMAL HIGH (ref 70–99)
Potassium: 4.2 mmol/L (ref 3.5–5.1)
Sodium: 142 mmol/L (ref 135–145)

## 2021-08-29 LAB — CBC
HCT: 45 % (ref 39.0–52.0)
Hemoglobin: 15.1 g/dL (ref 13.0–17.0)
MCH: 30.6 pg (ref 26.0–34.0)
MCHC: 33.6 g/dL (ref 30.0–36.0)
MCV: 91.1 fL (ref 80.0–100.0)
Platelets: 201 10*3/uL (ref 150–400)
RBC: 4.94 MIL/uL (ref 4.22–5.81)
RDW: 12.6 % (ref 11.5–15.5)
WBC: 4.4 10*3/uL (ref 4.0–10.5)
nRBC: 0 % (ref 0.0–0.2)

## 2021-08-29 LAB — SARS CORONAVIRUS 2 (TAT 6-24 HRS): SARS Coronavirus 2: NEGATIVE

## 2021-08-29 LAB — SURGICAL PCR SCREEN
MRSA, PCR: NEGATIVE
Staphylococcus aureus: NEGATIVE

## 2021-08-29 LAB — TYPE AND SCREEN
ABO/RH(D): AB POS
Antibody Screen: NEGATIVE

## 2021-08-29 NOTE — Progress Notes (Signed)
PCP: Dr. Aundria Mems Cardiologist: denies  EKG: na.  Patient states BG elevated at doctor offices.   CXR: na ECHO: denies Stress Test: denies Cardiac Cath: denies  Fasting Blood Sugar- na Checks Blood Sugar__na_ times a day  ASA/Blood Thinner: No  OSA/CPAP: No  Covid test 08/29/21 at PAT  Anesthesia Review: No  Patient denies shortness of breath, fever, cough, and chest pain at PAT appointment.  Patient verbalized understanding of instructions provided today at the PAT appointment.  Patient asked to review instructions at home and day of surgery.

## 2021-08-30 MED ORDER — VANCOMYCIN HCL 1500 MG/300ML IV SOLN
1500.0000 mg | INTRAVENOUS | Status: AC
  Administered 2021-08-31: 1500 mg via INTRAVENOUS
  Filled 2021-08-30 (×2): qty 300

## 2021-08-30 NOTE — Anesthesia Preprocedure Evaluation (Addendum)
Anesthesia Evaluation  Patient identified by MRN, date of birth, ID band Patient awake    Reviewed: Allergy & Precautions, NPO status , Patient's Chart, lab work & pertinent test results  History of Anesthesia Complications (+) PONV and history of anesthetic complications  Airway Mallampati: III  TM Distance: >3 FB Neck ROM: Full    Dental no notable dental hx. (+) Dental Advisory Given, Teeth Intact,    Pulmonary former smoker,    Pulmonary exam normal breath sounds clear to auscultation       Cardiovascular negative cardio ROS Normal cardiovascular exam Rhythm:Regular Rate:Normal     Neuro/Psych  Bell's palsy   Neuromuscular disease negative psych ROS   GI/Hepatic negative GI ROS, Neg liver ROS,   Endo/Other   Obesity   Renal/GU Renal disease     Musculoskeletal  (+) Arthritis ,   Abdominal (+) + obese,   Peds  Hematology negative hematology ROS (+)   Anesthesia Other Findings   Reproductive/Obstetrics                            Anesthesia Physical  Anesthesia Plan  ASA: 3  Anesthesia Plan: General   Post-op Pain Management: Tylenol PO (pre-op)* and Minimal or no pain anticipated   Induction: Intravenous  PONV Risk Score and Plan: 4 or greater and Treatment may vary due to age or medical condition, Ondansetron, Midazolam and Dexamethasone  Airway Management Planned: Oral ETT  Additional Equipment: None  Intra-op Plan:   Post-operative Plan: Extubation in OR  Informed Consent: I have reviewed the patients History and Physical, chart, labs and discussed the procedure including the risks, benefits and alternatives for the proposed anesthesia with the patient or authorized representative who has indicated his/her understanding and acceptance.     Dental advisory given  Plan Discussed with: CRNA  Anesthesia Plan Comments:        Anesthesia Quick Evaluation

## 2021-08-31 ENCOUNTER — Inpatient Hospital Stay (HOSPITAL_COMMUNITY)
Admission: RE | Admit: 2021-08-31 | Discharge: 2021-09-01 | DRG: 455 | Disposition: A | Discharge status: Home / Self Care | Payer: PRIVATE HEALTH INSURANCE | Attending: Orthopedic Surgery | Admitting: Orthopedic Surgery

## 2021-08-31 ENCOUNTER — Encounter (HOSPITAL_COMMUNITY): Payer: Self-pay | Admitting: Orthopedic Surgery

## 2021-08-31 ENCOUNTER — Other Ambulatory Visit: Payer: Self-pay

## 2021-08-31 ENCOUNTER — Ambulatory Visit: Payer: PRIVATE HEALTH INSURANCE | Admitting: Sports Medicine

## 2021-08-31 ENCOUNTER — Inpatient Hospital Stay (HOSPITAL_COMMUNITY): Payer: PRIVATE HEALTH INSURANCE

## 2021-08-31 ENCOUNTER — Inpatient Hospital Stay (HOSPITAL_COMMUNITY): Payer: PRIVATE HEALTH INSURANCE | Admitting: Anesthesiology

## 2021-08-31 ENCOUNTER — Inpatient Hospital Stay (HOSPITAL_COMMUNITY)
Admission: RE | Disposition: A | Discharge status: Home / Self Care | Payer: Self-pay | Source: Ambulatory Visit | Attending: Orthopedic Surgery

## 2021-08-31 DIAGNOSIS — Z6836 Body mass index (BMI) 36.0-36.9, adult: Secondary | ICD-10-CM | POA: Diagnosis not present

## 2021-08-31 DIAGNOSIS — M62838 Other muscle spasm: Secondary | ICD-10-CM | POA: Diagnosis present

## 2021-08-31 DIAGNOSIS — M5416 Radiculopathy, lumbar region: Principal | ICD-10-CM | POA: Diagnosis present

## 2021-08-31 DIAGNOSIS — N289 Disorder of kidney and ureter, unspecified: Secondary | ICD-10-CM

## 2021-08-31 DIAGNOSIS — Z20822 Contact with and (suspected) exposure to covid-19: Secondary | ICD-10-CM | POA: Diagnosis present

## 2021-08-31 DIAGNOSIS — M4316 Spondylolisthesis, lumbar region: Secondary | ICD-10-CM | POA: Diagnosis present

## 2021-08-31 DIAGNOSIS — Z8249 Family history of ischemic heart disease and other diseases of the circulatory system: Secondary | ICD-10-CM

## 2021-08-31 DIAGNOSIS — Z833 Family history of diabetes mellitus: Secondary | ICD-10-CM

## 2021-08-31 DIAGNOSIS — M532X6 Spinal instabilities, lumbar region: Secondary | ICD-10-CM | POA: Diagnosis not present

## 2021-08-31 DIAGNOSIS — Z79899 Other long term (current) drug therapy: Secondary | ICD-10-CM | POA: Diagnosis not present

## 2021-08-31 DIAGNOSIS — Z880 Allergy status to penicillin: Secondary | ICD-10-CM | POA: Diagnosis not present

## 2021-08-31 DIAGNOSIS — Z87442 Personal history of urinary calculi: Secondary | ICD-10-CM | POA: Diagnosis not present

## 2021-08-31 DIAGNOSIS — Z419 Encounter for procedure for purposes other than remedying health state, unspecified: Secondary | ICD-10-CM

## 2021-08-31 DIAGNOSIS — Z87891 Personal history of nicotine dependence: Secondary | ICD-10-CM

## 2021-08-31 DIAGNOSIS — Z83438 Family history of other disorder of lipoprotein metabolism and other lipidemia: Secondary | ICD-10-CM

## 2021-08-31 DIAGNOSIS — Z888 Allergy status to other drugs, medicaments and biological substances status: Secondary | ICD-10-CM

## 2021-08-31 DIAGNOSIS — M5410 Radiculopathy, site unspecified: Secondary | ICD-10-CM | POA: Diagnosis present

## 2021-08-31 DIAGNOSIS — E669 Obesity, unspecified: Secondary | ICD-10-CM | POA: Diagnosis present

## 2021-08-31 DIAGNOSIS — M48061 Spinal stenosis, lumbar region without neurogenic claudication: Secondary | ICD-10-CM

## 2021-08-31 HISTORY — PX: ANTERIOR LAT LUMBAR FUSION: SHX1168

## 2021-08-31 LAB — ABO/RH: ABO/RH(D): AB POS

## 2021-08-31 SURGERY — ANTERIOR LATERAL LUMBAR FUSION 1 LEVEL
Anesthesia: General | Site: Back | Laterality: Left

## 2021-08-31 MED ORDER — THROMBIN 20000 UNITS EX SOLR
CUTANEOUS | Status: DC | PRN
  Administered 2021-08-31: 20 mL via TOPICAL

## 2021-08-31 MED ORDER — FENOFIBRATE 160 MG PO TABS
160.0000 mg | ORAL_TABLET | Freq: Every day | ORAL | Status: DC
  Administered 2021-08-31 – 2021-09-01 (×2): 160 mg via ORAL
  Filled 2021-08-31 (×2): qty 1

## 2021-08-31 MED ORDER — ROCURONIUM BROMIDE 10 MG/ML (PF) SYRINGE
PREFILLED_SYRINGE | INTRAVENOUS | Status: DC | PRN
  Administered 2021-08-31: 60 mg via INTRAVENOUS

## 2021-08-31 MED ORDER — VANCOMYCIN HCL 1500 MG/300ML IV SOLN
1500.0000 mg | Freq: Once | INTRAVENOUS | Status: AC
  Administered 2021-08-31: 1500 mg via INTRAVENOUS
  Filled 2021-08-31: qty 300

## 2021-08-31 MED ORDER — FLEET ENEMA 7-19 GM/118ML RE ENEM: 1.0000 | ENEMA | Freq: Once | RECTAL | Status: DC | PRN

## 2021-08-31 MED ORDER — SENNOSIDES-DOCUSATE SODIUM 8.6-50 MG PO TABS: 1.0000 | ORAL_TABLET | Freq: Every evening | ORAL | Status: DC | PRN

## 2021-08-31 MED ORDER — PROPOFOL 10 MG/ML IV BOLUS
INTRAVENOUS | Status: AC
  Filled 2021-08-31: qty 20

## 2021-08-31 MED ORDER — MENTHOL 3 MG MT LOZG: 1.0000 | LOZENGE | OROMUCOSAL | Status: DC | PRN

## 2021-08-31 MED ORDER — POVIDONE-IODINE 7.5 % EX SOLN
Freq: Once | CUTANEOUS | Status: DC
  Filled 2021-08-31: qty 118

## 2021-08-31 MED ORDER — MIDAZOLAM HCL 2 MG/2ML IJ SOLN
INTRAMUSCULAR | Status: DC | PRN
Start: 2021-08-31 — End: 2021-08-31
  Administered 2021-08-31: 2 mg via INTRAVENOUS

## 2021-08-31 MED ORDER — SUCCINYLCHOLINE CHLORIDE 200 MG/10ML IV SOSY
PREFILLED_SYRINGE | INTRAVENOUS | Status: DC | PRN
  Administered 2021-08-31: 120 mg via INTRAVENOUS

## 2021-08-31 MED ORDER — SCOPOLAMINE 1 MG/3DAYS TD PT72
MEDICATED_PATCH | TRANSDERMAL | Status: DC | PRN
  Administered 2021-08-31: 1 via TRANSDERMAL

## 2021-08-31 MED ORDER — PROPOFOL 1000 MG/100ML IV EMUL
INTRAVENOUS | Status: AC
  Filled 2021-08-31: qty 100

## 2021-08-31 MED ORDER — LIDOCAINE 2% (20 MG/ML) 5 ML SYRINGE
INTRAMUSCULAR | Status: DC | PRN
  Administered 2021-08-31: 100 mg via INTRAVENOUS

## 2021-08-31 MED ORDER — MORPHINE SULFATE (PF) 2 MG/ML IV SOLN
1.0000 mg | INTRAVENOUS | Status: DC | PRN
  Administered 2021-08-31 – 2021-09-01 (×2): 2 mg via INTRAVENOUS
  Filled 2021-08-31 (×2): qty 1

## 2021-08-31 MED ORDER — METHOCARBAMOL 500 MG PO TABS: 500.0000 mg | ORAL_TABLET | Freq: Four times a day (QID) | ORAL | 2 refills | Status: DC | PRN

## 2021-08-31 MED ORDER — MEPERIDINE HCL 25 MG/ML IJ SOLN: 6.2500 mg | INTRAMUSCULAR | Status: DC | PRN

## 2021-08-31 MED ORDER — PROMETHAZINE HCL 25 MG/ML IJ SOLN: 6.2500 mg | INTRAMUSCULAR | Status: DC | PRN

## 2021-08-31 MED ORDER — OXYCODONE-ACETAMINOPHEN 5-325 MG PO TABS
1.0000 | ORAL_TABLET | ORAL | Status: DC | PRN
  Administered 2021-08-31 – 2021-09-01 (×5): 2 via ORAL
  Filled 2021-08-31 (×5): qty 2

## 2021-08-31 MED ORDER — 0.9 % SODIUM CHLORIDE (POUR BTL) OPTIME
TOPICAL | Status: DC | PRN
  Administered 2021-08-31: 1000 mL

## 2021-08-31 MED ORDER — ACETAMINOPHEN 325 MG PO TABS: 650.0000 mg | ORAL_TABLET | ORAL | Status: DC | PRN

## 2021-08-31 MED ORDER — FENTANYL CITRATE (PF) 250 MCG/5ML IJ SOLN
INTRAMUSCULAR | Status: AC
  Filled 2021-08-31: qty 5

## 2021-08-31 MED ORDER — PROPOFOL 10 MG/ML IV BOLUS
INTRAVENOUS | Status: DC | PRN
  Administered 2021-08-31: 200 mg via INTRAVENOUS

## 2021-08-31 MED ORDER — ALUM & MAG HYDROXIDE-SIMETH 200-200-20 MG/5ML PO SUSP: 30.0000 mL | Freq: Four times a day (QID) | ORAL | Status: DC | PRN

## 2021-08-31 MED ORDER — HYDROMORPHONE HCL 1 MG/ML IJ SOLN
INTRAMUSCULAR | Status: AC
  Filled 2021-08-31: qty 1

## 2021-08-31 MED ORDER — PHENYLEPHRINE HCL-NACL 20-0.9 MG/250ML-% IV SOLN
INTRAVENOUS | Status: DC | PRN
Start: 2021-08-31 — End: 2021-08-31
  Administered 2021-08-31: 25 ug/min via INTRAVENOUS

## 2021-08-31 MED ORDER — OXYCODONE-ACETAMINOPHEN 5-325 MG PO TABS: 1.0000 | ORAL_TABLET | ORAL | 0 refills | Status: DC | PRN

## 2021-08-31 MED ORDER — ORAL CARE MOUTH RINSE: 15.0000 mL | Freq: Once | OROMUCOSAL | Status: AC

## 2021-08-31 MED ORDER — POTASSIUM CHLORIDE IN NACL 20-0.9 MEQ/L-% IV SOLN: INTRAVENOUS | Status: DC

## 2021-08-31 MED ORDER — THROMBIN 20000 UNITS EX KIT
PACK | CUTANEOUS | Status: AC
  Filled 2021-08-31: qty 1

## 2021-08-31 MED ORDER — BUPIVACAINE LIPOSOME 1.3 % IJ SUSP
INTRAMUSCULAR | Status: DC | PRN
  Administered 2021-08-31: 20 mL

## 2021-08-31 MED ORDER — PROPOFOL 500 MG/50ML IV EMUL
INTRAVENOUS | Status: DC | PRN
  Administered 2021-08-31: 100 ug/kg/min via INTRAVENOUS

## 2021-08-31 MED ORDER — DEXAMETHASONE SODIUM PHOSPHATE 10 MG/ML IJ SOLN
INTRAMUSCULAR | Status: AC
  Filled 2021-08-31: qty 1

## 2021-08-31 MED ORDER — HYDROCODONE-ACETAMINOPHEN 5-325 MG PO TABS: 1.0000 | ORAL_TABLET | ORAL | Status: DC | PRN

## 2021-08-31 MED ORDER — SODIUM CHLORIDE 0.9 % IV SOLN: 250.0000 mL | INTRAVENOUS | Status: DC

## 2021-08-31 MED ORDER — SODIUM CHLORIDE 0.9% FLUSH
3.0000 mL | Freq: Two times a day (BID) | INTRAVENOUS | Status: DC
  Administered 2021-08-31: 3 mL via INTRAVENOUS

## 2021-08-31 MED ORDER — CHLORHEXIDINE GLUCONATE 0.12 % MT SOLN
15.0000 mL | Freq: Once | OROMUCOSAL | Status: AC
  Administered 2021-08-31: 15 mL via OROMUCOSAL
  Filled 2021-08-31: qty 15

## 2021-08-31 MED ORDER — LIDOCAINE 2% (20 MG/ML) 5 ML SYRINGE
INTRAMUSCULAR | Status: AC
  Filled 2021-08-31: qty 5

## 2021-08-31 MED ORDER — ONDANSETRON HCL 4 MG/2ML IJ SOLN
INTRAMUSCULAR | Status: AC
  Filled 2021-08-31: qty 2

## 2021-08-31 MED ORDER — BUPIVACAINE LIPOSOME 1.3 % IJ SUSP
INTRAMUSCULAR | Status: AC
  Filled 2021-08-31: qty 20

## 2021-08-31 MED ORDER — BUPIVACAINE-EPINEPHRINE 0.25% -1:200000 IJ SOLN
INTRAMUSCULAR | Status: DC | PRN
  Administered 2021-08-31: 9 mL
  Administered 2021-08-31: 10 mL

## 2021-08-31 MED ORDER — ONDANSETRON HCL 4 MG PO TABS: 4.0000 mg | ORAL_TABLET | Freq: Four times a day (QID) | ORAL | Status: DC | PRN

## 2021-08-31 MED ORDER — METHOCARBAMOL 500 MG PO TABS
500.0000 mg | ORAL_TABLET | Freq: Four times a day (QID) | ORAL | Status: DC | PRN
  Administered 2021-08-31 – 2021-09-01 (×4): 500 mg via ORAL
  Filled 2021-08-31 (×4): qty 1

## 2021-08-31 MED ORDER — SUGAMMADEX SODIUM 200 MG/2ML IV SOLN
INTRAVENOUS | Status: DC | PRN
Start: 2021-08-31 — End: 2021-08-31
  Administered 2021-08-31: 200 mg via INTRAVENOUS

## 2021-08-31 MED ORDER — HYDROMORPHONE HCL 1 MG/ML IJ SOLN
0.2500 mg | INTRAMUSCULAR | Status: DC | PRN
  Administered 2021-08-31 (×4): 0.5 mg via INTRAVENOUS

## 2021-08-31 MED ORDER — METHOCARBAMOL 1000 MG/10ML IJ SOLN
500.0000 mg | Freq: Four times a day (QID) | INTRAVENOUS | Status: DC | PRN
  Filled 2021-08-31: qty 5

## 2021-08-31 MED ORDER — DOCUSATE SODIUM 100 MG PO CAPS
100.0000 mg | ORAL_CAPSULE | Freq: Two times a day (BID) | ORAL | Status: DC
  Administered 2021-08-31 – 2021-09-01 (×3): 100 mg via ORAL
  Filled 2021-08-31 (×3): qty 1

## 2021-08-31 MED ORDER — ZOLPIDEM TARTRATE 5 MG PO TABS: 5.0000 mg | ORAL_TABLET | Freq: Every evening | ORAL | Status: DC | PRN

## 2021-08-31 MED ORDER — BUPIVACAINE-EPINEPHRINE 0.25% -1:200000 IJ SOLN
INTRAMUSCULAR | Status: DC | PRN
  Administered 2021-08-31: 20 mL

## 2021-08-31 MED ORDER — ROCURONIUM BROMIDE 10 MG/ML (PF) SYRINGE
PREFILLED_SYRINGE | INTRAVENOUS | Status: AC
  Filled 2021-08-31: qty 10

## 2021-08-31 MED ORDER — TOPIRAMATE 25 MG PO TABS
50.0000 mg | ORAL_TABLET | Freq: Every day | ORAL | Status: DC
Start: 2021-08-31 — End: 2021-09-01
  Administered 2021-08-31: 50 mg via ORAL
  Filled 2021-08-31 (×2): qty 2

## 2021-08-31 MED ORDER — LACTATED RINGERS IV SOLN: INTRAVENOUS | Status: DC | PRN

## 2021-08-31 MED ORDER — DIPHENHYDRAMINE HCL 50 MG/ML IJ SOLN
INTRAMUSCULAR | Status: DC | PRN
  Administered 2021-08-31: 12.5 mg via INTRAVENOUS

## 2021-08-31 MED ORDER — ONDANSETRON HCL 4 MG/2ML IJ SOLN
INTRAMUSCULAR | Status: DC | PRN
  Administered 2021-08-31: 4 mg via INTRAVENOUS

## 2021-08-31 MED ORDER — NAPHAZOLINE-PHENIRAMINE 0.025-0.3 % OP SOLN
2.0000 [drp] | Freq: Four times a day (QID) | OPHTHALMIC | Status: DC | PRN
  Filled 2021-08-31: qty 15

## 2021-08-31 MED ORDER — LACTATED RINGERS IV SOLN: INTRAVENOUS | Status: DC

## 2021-08-31 MED ORDER — ACETAMINOPHEN 650 MG RE SUPP: 650.0000 mg | RECTAL | Status: DC | PRN

## 2021-08-31 MED ORDER — BUPIVACAINE-EPINEPHRINE (PF) 0.25% -1:200000 IJ SOLN
INTRAMUSCULAR | Status: AC
  Filled 2021-08-31: qty 30

## 2021-08-31 MED ORDER — SODIUM CHLORIDE 0.9% FLUSH: 3.0000 mL | INTRAVENOUS | Status: DC | PRN

## 2021-08-31 MED ORDER — PHENOL 1.4 % MT LIQD: 1.0000 | OROMUCOSAL | Status: DC | PRN

## 2021-08-31 MED ORDER — MIDAZOLAM HCL 2 MG/2ML IJ SOLN
INTRAMUSCULAR | Status: AC
  Filled 2021-08-31: qty 2

## 2021-08-31 MED ORDER — KETAMINE HCL 50 MG/5ML IJ SOSY
PREFILLED_SYRINGE | INTRAMUSCULAR | Status: AC
  Filled 2021-08-31: qty 5

## 2021-08-31 MED ORDER — FENTANYL CITRATE (PF) 250 MCG/5ML IJ SOLN
INTRAMUSCULAR | Status: DC | PRN
  Administered 2021-08-31: 100 ug via INTRAVENOUS
  Administered 2021-08-31 (×3): 50 ug via INTRAVENOUS

## 2021-08-31 MED ORDER — BISACODYL 5 MG PO TBEC: 5.0000 mg | DELAYED_RELEASE_TABLET | Freq: Every day | ORAL | Status: DC | PRN

## 2021-08-31 MED ORDER — ACETAMINOPHEN 500 MG PO TABS
1000.0000 mg | ORAL_TABLET | Freq: Once | ORAL | Status: AC
  Administered 2021-08-31: 1000 mg via ORAL
  Filled 2021-08-31: qty 2

## 2021-08-31 MED ORDER — ONDANSETRON HCL 4 MG/2ML IJ SOLN: 4.0000 mg | Freq: Four times a day (QID) | INTRAMUSCULAR | Status: DC | PRN

## 2021-08-31 SURGICAL SUPPLY — 76 items
BAG COUNTER SPONGE SURGICOUNT (BAG) ×3 IMPLANT
BATTALION LLIF ITRADISCAL SHIM (MISCELLANEOUS) ×2
BENZOIN TINCTURE PRP APPL 2/3 (GAUZE/BANDAGES/DRESSINGS) ×2 IMPLANT
BLADE CLIPPER SURG (BLADE) ×1 IMPLANT
BLADE SURG 10 STRL SS (BLADE) ×3 IMPLANT
CLIP SPRING STIM LLIF SAFEOP (CLIP) ×1 IMPLANT
COVER BACK TABLE 80X110 HD (DRAPES) ×2 IMPLANT
COVER SURGICAL LIGHT HANDLE (MISCELLANEOUS) ×2 IMPLANT
DILATOR INSULATED LLIF 8-13-18 (NEUROSURGERY SUPPLIES) ×1 IMPLANT
DRAPE C-ARM 42X72 X-RAY (DRAPES) ×3 IMPLANT
DRAPE C-ARMOR (DRAPES) ×3 IMPLANT
DRAPE POUCH INSTRU U-SHP 10X18 (DRAPES) ×2 IMPLANT
DRAPE SURG 17X23 STRL (DRAPES) ×8 IMPLANT
DURAPREP 26ML APPLICATOR (WOUND CARE) ×2 IMPLANT
ELECT BLADE 6.5 EXT (BLADE) ×2 IMPLANT
ELECT CAUTERY BLADE 6.4 (BLADE) ×2 IMPLANT
ELECT KIT SAFEOP SSEP/SURF (KITS) ×2
ELECT REM PT RETURN 9FT ADLT (ELECTROSURGICAL) ×2
ELECTRODE KT SAFEOP SSEP/SURF (KITS) IMPLANT
ELECTRODE REM PT RTRN 9FT ADLT (ELECTROSURGICAL) ×1 IMPLANT
GAUZE 4X4 16PLY ~~LOC~~+RFID DBL (SPONGE) ×2 IMPLANT
GAUZE SPONGE 4X4 12PLY STRL (GAUZE/BANDAGES/DRESSINGS) ×2 IMPLANT
GLOVE SRG 8 PF TXTR STRL LF DI (GLOVE) ×1 IMPLANT
GLOVE SURG ENC MOIS LTX SZ6.5 (GLOVE) ×4 IMPLANT
GLOVE SURG ENC MOIS LTX SZ8 (GLOVE) ×3 IMPLANT
GLOVE SURG UNDER POLY LF SZ7 (GLOVE) ×3 IMPLANT
GLOVE SURG UNDER POLY LF SZ8 (GLOVE) ×2
GOWN STRL REUS W/ TWL LRG LVL3 (GOWN DISPOSABLE) ×1 IMPLANT
GOWN STRL REUS W/ TWL XL LVL3 (GOWN DISPOSABLE) ×2 IMPLANT
GOWN STRL REUS W/TWL LRG LVL3 (GOWN DISPOSABLE) ×2
GOWN STRL REUS W/TWL XL LVL3 (GOWN DISPOSABLE) ×2
GUIDEWIRE LLIF TT 320 (WIRE) ×1 IMPLANT
GUIDEWIRE SHARP VIPER II (WIRE) ×4 IMPLANT
IV CATH 14GX2 1/4 (CATHETERS) ×2 IMPLANT
KIT BASIN OR (CUSTOM PROCEDURE TRAY) ×2 IMPLANT
KIT TURNOVER KIT B (KITS) ×2 IMPLANT
KNIFE ANNULOTOMY (BLADE) ×1 IMPLANT
MARKER SKIN DUAL TIP RULER LAB (MISCELLANEOUS) ×3 IMPLANT
MIX DBX 10CC 35% BONE (Bone Implant) ×1 IMPLANT
NDL HYPO 25GX1X1/2 BEV (NEEDLE) ×1 IMPLANT
NDL SPNL 18GX3.5 QUINCKE PK (NEEDLE) ×1 IMPLANT
NEEDLE HYPO 25GX1X1/2 BEV (NEEDLE) ×2 IMPLANT
NEEDLE SPNL 18GX3.5 QUINCKE PK (NEEDLE) ×4 IMPLANT
NS IRRIG 1000ML POUR BTL (IV SOLUTION) ×4 IMPLANT
PACK LAMINECTOMY ORTHO (CUSTOM PROCEDURE TRAY) ×2 IMPLANT
PACK UNIVERSAL I (CUSTOM PROCEDURE TRAY) ×3 IMPLANT
PAD ARMBOARD 7.5X6 YLW CONV (MISCELLANEOUS) ×4 IMPLANT
PLATE LIF AMP 2/SCRW 08/15 (Plate) ×1 IMPLANT
PLATE LIF AMP 2/SCRW 10/15 (Plate) ×1 IMPLANT
PROBE BALL TIP LLIF SAFEOP (NEUROSURGERY SUPPLIES) ×1 IMPLANT
ROD PRE LORDOSED VIPER 5.5X50 (Rod) ×1 IMPLANT
ROD PREBENT VIPER 5.5X55MM (Rod) ×1 IMPLANT
SCREW LIF AMP 5.5X55 (Screw) ×1 IMPLANT
SCREW POLY X-TAB VIPER 7.0X50 (Screw) ×4 IMPLANT
SCREW SET SINGLE INNER MIS (Screw) ×4 IMPLANT
SHIM ITRADISCAL BATTALION LLIF (MISCELLANEOUS) IMPLANT
SPACER TRANSCEND 12X18X60 0D (Spacer) ×1 IMPLANT
SPONGE INTESTINAL PEANUT (DISPOSABLE) ×6 IMPLANT
SPONGE SURGIFOAM ABS GEL 100 (HEMOSTASIS) ×1 IMPLANT
SPONGE T-LAP 4X18 ~~LOC~~+RFID (SPONGE) ×3 IMPLANT
STAPLER VISISTAT 35W (STAPLE) ×2 IMPLANT
STRIP CLOSURE SKIN 1/2X4 (GAUZE/BANDAGES/DRESSINGS) ×3 IMPLANT
SUT MNCRL AB 4-0 PS2 18 (SUTURE) ×4 IMPLANT
SUT VIC AB 0 CT1 18XCR BRD 8 (SUTURE) ×1 IMPLANT
SUT VIC AB 0 CT1 8-18 (SUTURE) ×1
SUT VIC AB 1 CT1 18XCR BRD 8 (SUTURE) ×1 IMPLANT
SUT VIC AB 1 CT1 8-18 (SUTURE) ×2
SUT VIC AB 2-0 CT2 18 VCP726D (SUTURE) ×3 IMPLANT
SYR BULB IRRIG 60ML STRL (SYRINGE) ×2 IMPLANT
SYR CONTROL 10ML LL (SYRINGE) ×2 IMPLANT
TAP CANN VIPER2 DL 6.0 (TAP) ×2 IMPLANT
TOWEL GREEN STERILE (TOWEL DISPOSABLE) ×3 IMPLANT
TOWEL GREEN STERILE FF (TOWEL DISPOSABLE) ×2 IMPLANT
TRAY FOLEY MTR SLVR 16FR STAT (SET/KITS/TRAYS/PACK) ×2 IMPLANT
WATER STERILE IRR 1000ML POUR (IV SOLUTION) ×2 IMPLANT
YANKAUER SUCT BULB TIP NO VENT (SUCTIONS) ×3 IMPLANT

## 2021-08-31 NOTE — Anesthesia Procedure Notes (Signed)
Procedure Name: Intubation Date/Time: 08/31/2021 7:48 AM Performed by: Terrence Dupont, CRNA Pre-anesthesia Checklist: Patient identified, Emergency Drugs available, Suction available and Patient being monitored Patient Re-evaluated:Patient Re-evaluated prior to induction Oxygen Delivery Method: Circle system utilized Preoxygenation: Pre-oxygenation with 100% oxygen Induction Type: IV induction Ventilation: Mask ventilation without difficulty Laryngoscope Size: Mac and 4 Grade View: Grade I Tube type: Oral Tube size: 7.5 mm Number of attempts: 1 Airway Equipment and Method: Stylet and Oral airway Placement Confirmation: ETT inserted through vocal cords under direct vision, positive ETCO2 and breath sounds checked- equal and bilateral Tube secured with: Tape Dental Injury: Teeth and Oropharynx as per pre-operative assessment

## 2021-08-31 NOTE — H&P (Addendum)
PREOPERATIVE H&P  Chief Complaint: Right leg pain and weakness  HPI: Richard Gross is a 61 y.o. male who presents with ongoing pain and weakness in the right leg  MRI reveals severe stenosis and instability at L3/4  Patient has failed multiple forms of conservative care and continues to have pain (see office notes for additional details regarding the patient's full course of treatment)  Past Medical History:  Diagnosis Date   Complication of anesthesia    History of kidney stones    PONV (postoperative nausea and vomiting)    Past Surgical History:  Procedure Laterality Date   LUMBAR LAMINECTOMY/DECOMPRESSION MICRODISCECTOMY N/A 12/31/2018   Procedure: LUMBAR 2 - LUMBAR 5 DECOMPRESSION;  Surgeon: Phylliss Bob, MD;  Location: [facility];  Service: Orthopedics;  Laterality: N/A;   TONSILLECTOMY     Social History   Socioeconomic History   Marital status: Married    Spouse name: Not on file   Number of children: Not on file   Years of education: Not on file   Highest education level: Not on file  Occupational History   Not on file  Tobacco Use   Smoking status: Former    Types: Cigarettes    Quit date: 07/17/1995    Years since quitting: 26.1   Smokeless tobacco: Never  Vaping Use   Vaping Use: Never used  Substance and Sexual Activity   Alcohol use: No   Drug use: No   Sexual activity: Not on file  Other Topics Concern   Not on file  Social History Narrative   Not on file   Social Determinants of Health   Financial Resource Strain: Not on file  Food Insecurity: Not on file  Transportation Needs: Not on file  Physical Activity: Not on file  Stress: Not on file  Social Connections: Not on file   Family History  Problem Relation Age of Onset   Cancer Mother    AAA (abdominal aortic aneurysm) Mother    AAA (abdominal aortic aneurysm) Father    Cancer Father    Heart disease Sister    Diabetes Sister    Hyperlipidemia Sister    Hypertension Sister    Heart  disease Brother    Diabetes Brother    Hyperlipidemia Brother    Hypertension Brother    Allergies  Allergen Reactions   Amoxicillin Shortness Of Breath and Other (See Comments)    Flushing Did it involve swelling of the face/tongue/throat, SOB, or low BP? Yes Did it involve sudden or severe rash/hives, skin peeling, or any reaction on the inside of your mouth or nose? No Did you need to seek medical attention at a hospital or doctor's office? Yes When did it last happen? 8 years ago If all above answers are NO, may proceed with cephalosporin use.   Statins Other (See Comments)    Made pt feel loopy   Prior to Admission medications   Medication Sig Start Date End Date Taking? Authorizing Provider  acetaminophen (TYLENOL) 500 MG tablet Take 500 mg by mouth every 8 (eight) hours as needed for moderate pain.   Yes [provider]  diclofenac (VOLTAREN) 75 MG EC tablet TAKE 1 TABLET BY MOUTH TWICE A DAY 08/14/21  Yes Silverio Decamp, MD  fenofibrate 160 MG tablet TAKE 1 TABLET BY MOUTH EVERY DAY 10/31/20  Yes Silverio Decamp, MD  Multiple Vitamin (MULTIVITAMIN) tablet Take 1 tablet by mouth daily.   Yes [provider]  tadalafil (CIALIS)  5 MG tablet TAKE 1 TABLET BY MOUTH EVERY DAY 05/01/21  Yes Silverio Decamp, MD  topiramate (TOPAMAX) 50 MG tablet TAKE 1 TABLET BY MOUTH EVERYDAY AT BEDTIME 07/13/21  Yes Silverio Decamp, MD     All other systems have been reviewed and were otherwise negative with the exception of those mentioned in the HPI and as above.  Physical Exam: Vitals:   08/31/21 0604  BP: (!) 144/82  Pulse: 94  Resp: 18  Temp: 97.9 F (36.6 C)  SpO2: 97%    Body mass index is 36.92 kg/m.  General: Alert, no acute distress Cardiovascular: No pedal edema Respiratory: No cyanosis, no use of accessory musculature Skin: No lesions in the area of chief complaint Neurologic: Sensation intact distally Psychiatric: Patient  is competent for consent with normal mood and affect Lymphatic: No axillary or cervical lymphadenopathy   Assessment/Plan: Moderate to severe central and moderate bilateral foraminal stenosis at L3-L, L3/4 anterolisthesis Plan for Procedure(s): LEFT-SIDED LATERAL/POSTERIOR INTERBODY FUSION DECOMPRESSION LUMBAR 3 - LUMBAR 4 WITH INSTRUMENTATION AND ALLOGRAFT   Norva Karvonen, MD 08/31/2021 6:23 AM

## 2021-08-31 NOTE — Anesthesia Postprocedure Evaluation (Signed)
Anesthesia Post Note  Patient: Adult nurse  Procedure(s) Performed: LEFT-SIDED LATERAL INTERBODY FUSION DECOMPRESSION LUMBAR 3 - LUMBAR 4 WITH INSTRUMENTATION AND ALLOGRAFT (Left: Back)     Patient location during evaluation: PACU Anesthesia Type: General Level of consciousness: sedated and patient cooperative Pain management: pain level controlled Vital Signs Assessment: post-procedure vital signs reviewed and stable Respiratory status: spontaneous breathing Cardiovascular status: stable Anesthetic complications: no   No notable events documented.  Last Vitals:  Vitals:   08/31/21 1305 08/31/21 1339  BP: 115/76 130/68  Pulse: 86 88  Resp: 17 18  Temp: 36.6 C 36.6 C  SpO2: 98% (!) 87%    Last Pain:  Vitals:   08/31/21 1427  TempSrc:   PainSc: 10-Worst pain ever                 Nolon Nations

## 2021-08-31 NOTE — Op Note (Signed)
PATIENT NAME: Richard Gross   MEDICAL RECORD NO.:   [id number]    DATE OF BIRTH: [birth year]   DATE OF PROCEDURE: 08/31/2021                                OPERATIVE REPORT   PREOPERATIVE DIAGNOSES: 1.  Left-sided lumbar radiculopathy, resulting in ongoing and progressive right leg pain and weakness 2.  S/p previous L3/4 decompression 3.  L3/4 stenosis and instability   POSTOPERATIVE DIAGNOSES: 1.  Left-sided lumbar radiculopathy, resulting in ongoing and progressive right leg pain and weakness 2.  S/p previous L3/4 decompression 3.  L3/4 stenosis and instability   PROCEDURE:  1.  Left-sided lateral interbody fusion, L3/4  via direct lateral retroperitoneal approach. 2.  Insertion of interbody device x1 (12 x 18 mm x 60 mm Alphatec intervertebral spacer). 3.  Placement of anterior instrumentation, L3/4 (size 10 plate) 4.  Use of morselized allograft -- DBX-mix.   5.  Intraoperative use of fluoroscopy. 6.  Posterior spinal fusion, L3-4 7.  Placement of posterior instrumentation, L3, L4   SURGEON:  Phylliss Bob, MD   ASSISTANT:  Pricilla Holm PA-C.   ANESTHESIA:  General endotracheal anesthesia.   COMPLICATIONS:  None.   DISPOSITION:  Stable.   ESTIMATED BLOOD LOSS:  Minimal.   INDICATIONS:  Briefly, Richard Gross is a very pleasant 61 year old male who did present to me with ongoing pain and weakness in the right leg.  He is noted to be status post a previous decompression at L3-4, which he did well from, but he subsequently did develop recurrent stenosis and instability at L3/4. Given his ongoing pain and dysfunction, we did discuss proceeding with the procedure reflected above.  The patient did wish to proceed, after a full understanding of the risks and benefits of surgery.   DESCRIPTION OF PROCEDURE:  On 08/31/2021, the patient was brought to surgery and general endotracheal anesthesia was administered.  The patient was placed in the lateral decubitus position, with the left side  up.  Neurologic monitoring leads were placed by the monitoring technician.  The patient's torso and lower extremities were secured to the bed. The patient's hips and knees were flexed in order to lessen the tension on the psoas musculature. The left flank was then prepped and draped in the usual sterile fashion.  The bed was flexed, in order to optimize exposure to the L3/4 intervertebral space.  After a timeout procedure was performed, a left-sided transverse incision was made over the left flank overlying the L3/4 intervertebral space.  The retroperitoneal space was encountered, after dissection through the oblique musculature.  The peritoneum was bluntly swept anteriorly, and the psoas was readily identified.  I did use a series of dilators to dock over the L3/4 intervertebral space.  I did use neurologic monitoring while placing the dilators, in order to ensure that there were no neurologic structures in the immediate vicinity of the dilators.  The lumbar plexus was noted to be posterior.  A self-retaining retractor was placed, and was attached to a rigid arm.  The retractor was very gently dilated and a shim was placed into the L3/4 intervertebral space.  I then used a knife to perform an annulotomy at the lateral aspect of the L3/4 intervertebral space.  I then used a series of curettes and pituitary rongeurs in order to perform a thorough and complete L3/4 intervertebral diskectomy.  The contralateral annulus was released.  I then placed a series of intervertebral spacer trials, and I did feel that a 12 x 18 mm x 60 mm spacer would be the most appropriate fit.  The appropriate spacer was then packed with DBX-mix and tamped into position.  I was very pleased with the final resting position of the intervertebral spacer.  Excellent height restoration was noted. At this point, an anterior plate was placed over the lateral aspect of the L3 and L4 vertebral bodies.  I then used an awl to prepare the trajectory of  the L3 and L4 vertebral body screws.  A 55 mm screw was placed into the L3 vertebral body, and a 55 mm screw was placed into the L4 vertebral body.  I was very pleased with the final AP and lateral fluoroscopic images and the excellent restoration of disk height identified on both AP and lateral images.  At this point, the wound was copiously irrigated.  The fascia, internal, and external oblique musculature was closed using #1 Vicryl.  The subcutaneous layer was closed using 2-0 Vicryl and the skin was closed using 4-0 Monocryl. Of note, I did use neurologic monitoring throughout the entire anterior surgery, and there was no abnormal EMG activity noted throughout the surgery.   The patient was then positioned prone on a well-padded flat Jackson bed with a spinal frame.  The back was then prepped and draped in the usual fashion.  A timeout procedure was again performed.  I then made bilateral paramedian incisions on the right and left sides, overlying the L3-4 intervertebral space.  At this point, the posterolateral gutter on the left, at L3-4 was identified and subperiosteally exposed and decorticated.  The remainder of the DBX mix was packed into the posterolateral gutter, in order to achieve a successful L3-4 posterolateral fusion.  I then placed Jamshidis through the pedicles bilaterally at L3 and L4.  Guidewires were placed, and subsequent to this, a 6 mm tap was used to cannulate the pedicles.  Bilaterally, 7 x 50 mm screws were advanced into the L3 and L4 pedicles.  I was very pleased with the purchase of each of the screws.  I then secured 50 mm rods bilaterally into the tulip heads of the L4 and L5 screws.  Caps were then placed and a final locking procedure was performed.  I did liberally use AP and lateral fluoroscopy while placing the guidewires and the implants.  I was very pleased with the final fluoroscopic images. The wounds were then closed using #1 Vicryl followed by 2-0 Vicryl followed by 4-0  Monocryl.  Benzoin and Steri-Strips were applied, followed by a sterile dressing.  All instrument counts were correct at the termination of the procedure.   Of note, Pricilla Holm was my assistant throughout surgery, and did aid in retraction, placement of the hardware, suctioning, and closure.   Phylliss Bob, MD

## 2021-08-31 NOTE — Transfer of Care (Signed)
Immediate Anesthesia Transfer of Care Note  Patient: Richard Gross  Procedure(s) Performed: LEFT-SIDED LATERAL INTERBODY FUSION DECOMPRESSION LUMBAR 3 - LUMBAR 4 WITH INSTRUMENTATION AND ALLOGRAFT (Left)  Patient Location: PACU  Anesthesia Type:General  Level of Consciousness: awake and drowsy  Airway & Oxygen Therapy: Patient Spontanous Breathing and Patient connected to face mask oxygen  Post-op Assessment: Report given to RN and Post -op Vital signs reviewed and stable  Post vital signs: Reviewed and stable  Last Vitals:  Vitals Value Taken Time  BP 100/57 08/31/21 1148  Temp    Pulse 80 08/31/21 1150  Resp 17 08/31/21 1150  SpO2 98 % 08/31/21 1150  Vitals shown include unvalidated device data.  Last Pain:  Vitals:   08/31/21 0642  TempSrc:   PainSc: 0-No pain         Complications: No notable events documented.

## 2021-09-01 MED FILL — Thrombin For Soln Kit 20000 Unit: CUTANEOUS | Qty: 1 | Status: AC

## 2021-09-01 NOTE — Evaluation (Signed)
Physical Therapy Evaluation & Discharge Patient Details Name: Richard Gross MRN: [id number] DOB: 1961/02/11 Today's Date: 09/01/2021  History of Present Illness  61 y/o male admitted on 08/31/21 following L posterolateral fusion L3-4. PMH: hx of L2-5 decompression.  Clinical Impression  Patient admitted following above procedure. Patient functioning at modI level for mobility with no AD. Educated patient on back precautions, brace wear, and progressive walking program, patient verbalized and demonstrated understanding. Patient has mild R ankle DF weakness resulting in mild toe drag during mobility which patient reports having R foot slap during ambulation prior to procedure. No further skilled PT needs identified acutely. No PT follow up recommended at this time, but patient may benefit from OPPT once back precautions are lifted to address R ankle DF strength deficits.        Recommendations for follow up therapy are one component of a multi-disciplinary discharge planning process, led by the attending physician.  Recommendations may be updated based on patient status, additional functional criteria and insurance authorization.  Follow Up Recommendations No PT follow up    Assistance Recommended at Discharge Intermittent Supervision/Assistance  Patient can return home with the following       Equipment Recommendations None recommended by PT  Recommendations for Other Services       Functional Status Assessment Patient has not had a recent decline in their functional status     Precautions / Restrictions Precautions Precautions: Back Precaution Booklet Issued: Yes (comment) Required Braces or Orthoses: Spinal Brace Spinal Brace: Thoracolumbosacral orthotic;Applied in sitting position Restrictions Weight Bearing Restrictions: No      Mobility  Bed Mobility Overal bed mobility: Modified Independent                  Transfers Overall transfer level: Modified  independent Equipment used: None                    Ambulation/Gait Ambulation/Gait assistance: Modified independent (Device/Increase time) Gait Distance (Feet): 400 Feet Assistive device: None Gait Pattern/deviations: Step-through pattern, Decreased dorsiflexion - right   Gait velocity interpretation: >2.62 ft/sec, indicative of community ambulatory      Stairs Stairs: Yes Stairs assistance: Modified independent (Device/Increase time) Stair Management: One rail Left, Alternating pattern, Forwards Number of Stairs: 3    Wheelchair Mobility    Modified Rankin (Stroke Patients Only)       Balance Overall balance assessment: No apparent balance deficits (not formally assessed)                                           Pertinent Vitals/Pain Pain Assessment Pain Assessment: Faces Faces Pain Scale: Hurts little more Pain Location: back Pain Descriptors / Indicators: Grimacing Pain Intervention(s): Monitored during session, Repositioned    Home Living Family/patient expects to be discharged to:: Private residence Living Arrangements: Spouse/significant other Available Help at Discharge: Family Type of Home: House Home Access: Stairs to enter Entrance Stairs-Rails: None Entrance Stairs-Number of Steps: 2   Home Layout: One level Home Equipment: None      Prior Function Prior Level of Function : Independent/Modified Independent;Working/employed;Driving                     Hand Dominance        Extremity/Trunk Assessment   Upper Extremity Assessment Upper Extremity Assessment: Defer to OT evaluation    Lower Extremity Assessment Lower  Extremity Assessment: Generalized weakness;RLE deficits/detail RLE Deficits / Details: mild R ankle DF weakness with toe drag. Patient reports foot slap prior to surgery    Cervical / Trunk Assessment Cervical / Trunk Assessment: Back Surgery  Communication   Communication: No  difficulties  Cognition Arousal/Alertness: Awake/alert Behavior During Therapy: WFL for tasks assessed/performed Overall Cognitive Status: Within Functional Limits for tasks assessed                                          General Comments      Exercises     Assessment/Plan    PT Assessment Patient does not need any further PT services  PT Problem List         PT Treatment Interventions      PT Goals (Current goals can be found in the Care Plan section)  Acute Rehab PT Goals Patient Stated Goal: to go home PT Goal Formulation: All assessment and education complete, DC therapy    Frequency       Co-evaluation               AM-PAC PT "6 Clicks" Mobility  Outcome Measure Help needed turning from your back to your side while in a flat bed without using bedrails?: None Help needed moving from lying on your back to sitting on the side of a flat bed without using bedrails?: None Help needed moving to and from a bed to a chair (including a wheelchair)?: None Help needed standing up from a chair using your arms (e.g., wheelchair or bedside chair)?: None Help needed to walk in hospital room?: None Help needed climbing 3-5 steps with a railing? : None 6 Click Score: 24    End of Session Equipment Utilized During Treatment: Back brace Activity Tolerance: Patient tolerated treatment well Patient left: in bed;with call bell/phone within reach (seated EOB) Nurse Communication: Mobility status PT Visit Diagnosis: Muscle weakness (generalized) (M62.81)    Time: 3254-9826 PT Time Calculation (min) (ACUTE ONLY): 16 min   Charges:   PT Evaluation $PT Eval Low Complexity: 1 Low          Erum Cercone A. Gilford Rile PT, DPT Acute Rehabilitation Services Pager [phone] Office [phone]   Linna Hoff 09/01/2021, 9:31 AM

## 2021-09-01 NOTE — Plan of Care (Signed)

## 2021-09-01 NOTE — Progress Notes (Signed)
Patient awaiting transport via wheelchair by volunteer for discharge home; in no acute distress nor complaints of pain nor discomfort; incision on his back and left flank with gauze dressing and were clean, dry and intact; room was checked and accounted for all his belongings; discharge instructions concerning his medications, incision care, follow up appointment and when to call the doctor as needed were all discussed with patient and his wife and they expressed understanding  on the instructions given.

## 2021-09-01 NOTE — Evaluation (Signed)
Occupational Therapy Evaluation Patient Details Name: Richard Gross MRN: [id number] DOB: 02-15-1961 Today's Date: 09/01/2021   History of Present Illness 61 y/o male admitted on 08/31/21 following L posterolateral fusion L3-4. PMH: hx of L2-5 decompression.   Clinical Impression   Patient evaluated by Occupational Therapy with no further acute OT needs identified. All education has been completed and the patient has no further questions. All education completed.  Wife is able to safely assist with LB ADLs as needed.  See below for any follow-up Occupational Therapy or equipment needs. OT is signing off. Thank you for this referral.       Recommendations for follow up therapy are one component of a multi-disciplinary discharge planning process, led by the attending physician.  Recommendations may be updated based on patient status, additional functional criteria and insurance authorization.   Follow Up Recommendations  No OT follow up    Assistance Recommended at Discharge None  Patient can return home with the following Assist for transportation;Assistance with cooking/housework    Functional Status Assessment  Patient has had a recent decline in their functional status and demonstrates the ability to make significant improvements in function in a reasonable and predictable amount of time.  Equipment Recommendations  None recommended by OT    Recommendations for Other Services       Precautions / Restrictions Precautions Precautions: Back Precaution Booklet Issued: Yes (comment) Precaution Comments: pt able to verbalize precautions and demonstrates good awareness during ADLs Required Braces or Orthoses: Spinal Brace Spinal Brace: Thoracolumbosacral orthotic;Applied in sitting position Restrictions Weight Bearing Restrictions: No      Mobility Bed Mobility Overal bed mobility: Modified Independent                  Transfers Overall transfer level: Modified  independent Equipment used: None                      Balance Overall balance assessment: No apparent balance deficits (not formally assessed)                                         ADL either performed or assessed with clinical judgement   ADL Overall ADL's : Needs assistance/impaired Eating/Feeding: Independent   Grooming: Wash/dry hands;Wash/dry face;Oral care;Supervision/safety;Standing Grooming Details (indicate cue type and reason): demonstrates good technique Upper Body Bathing: Supervision/ safety;Set up;Sitting   Lower Body Bathing: Supervison/ safety   Upper Body Dressing : Set up;Supervision/safety;Sitting   Lower Body Dressing: Moderate assistance;Sit to/from stand Lower Body Dressing Details (indicate cue type and reason): requires assist for socks and shoes.  able to perform figure 4 with Rt LE, but unable with Lt LE at this time.  Wife will assist as needed Toilet Transfer: Modified Independent;Ambulation;Comfort height toilet;Grab bars   Toileting- Clothing Manipulation and Hygiene: Supervision/safety;Sit to/from stand Toileting - Clothing Manipulation Details (indicate cue type and reason): pt instructed in use and acquisition of toilet aid     Functional mobility during ADLs: Modified independent       Vision Baseline Vision/History: 1 Wears glasses Patient Visual Report: No change from baseline       Perception     Praxis      Pertinent Vitals/Pain Pain Assessment Pain Assessment: Faces Faces Pain Scale: Hurts a little bit Pain Location: back Pain Descriptors / Indicators: Grimacing Pain Intervention(s): Monitored during session     Hand  Dominance Right   Extremity/Trunk Assessment Upper Extremity Assessment Upper Extremity Assessment: Overall WFL for tasks assessed   Lower Extremity Assessment Lower Extremity Assessment: Defer to PT evaluation RLE Deficits / Details: mild R ankle DF weakness with toe drag.  Patient reports foot slap prior to surgery   Cervical / Trunk Assessment Cervical / Trunk Assessment: Back Surgery   Communication Communication Communication: No difficulties   Cognition Arousal/Alertness: Awake/alert Behavior During Therapy: WFL for tasks assessed/performed Overall Cognitive Status: Within Functional Limits for tasks assessed                                       General Comments  discussed use of reacher and LH sponge.  Wife present during session    Exercises     Shoulder [place] expects to be discharged to:: Private residence Living Arrangements: Spouse/significant other Available Help at Discharge: Family Type of Home: House Home Access: Stairs to enter Technical brewer of Steps: 2 Entrance Stairs-Rails: None Home Layout: One level     Bathroom Shower/Tub: Occupational psychologist: Handicapped height     [place]: None          Prior Functioning/Environment Prior Level of Function : Independent/Modified Independent;Working/employed;Driving                        OT Problem List: Decreased knowledge of use of DME or AE;Decreased knowledge of precautions;Pain;Obesity      OT Treatment/Interventions:      OT Goals(Current goals can be found in the care plan section) Acute Rehab OT Goals Patient Stated Goal: did not state OT Goal Formulation: All assessment and education complete, DC therapy  OT Frequency:      Co-evaluation              AM-PAC OT "6 Clicks" Daily Activity     Outcome Measure Help from another person eating meals?: None Help from another person taking care of personal grooming?: A Little Help from another person toileting, which includes using toliet, bedpan, or urinal?: A Little Help from another person bathing (including washing, rinsing, drying)?: A Little Help from another person to put on and taking off regular upper body  clothing?: A Little Help from another person to put on and taking off regular lower body clothing?: A Little 6 Click Score: 19   End of Session Equipment Utilized During Treatment: Back brace Nurse Communication: Mobility status  Activity Tolerance: Patient tolerated treatment well Patient left: in bed;with call bell/phone within reach;with family/visitor present (EOB)  OT Visit Diagnosis: Pain Pain - part of body:  (back)                Time: 5945-8592 OT Time Calculation (min): 19 min Charges:  OT General Charges $OT Visit: 1 Visit OT Evaluation $OT Eval Low Complexity: 1 Low  Nilsa Nutting., OTR/L Acute Rehabilitation Services Pager [phone] Office [phone]   Lucille Passy M 09/01/2021, 10:44 AM

## 2021-09-01 NOTE — Progress Notes (Signed)
° ° °  Patient doing well PO day 1 S/P L3/4 LAT/POST fusion. He is doing well with well controlled pain. Expected pain overnight improved with walking. Eating and drinking NL, NL B/B function. Eager to progress home after PT.    Physical Exam: Vitals:   09/01/21 0334 09/01/21 0729  BP: 119/74 113/60  Pulse: 89 97  Resp: 18 18  Temp: 98.6 F (37 C) 99.3 F (37.4 C)  SpO2: 95% 94%    Dressing in place, CDI, pt resting comfortably in bed, TLSO brace at bedside. NVI  POD #1 s/p L3/4 LAT/POST fusion, doing well, improved strength with still some foot weakness no unexpected   - up with PT/OT, encourage ambulation - Percocet for pain, Robaxin for muscle spasms  -Sent to pharm electronically  - likely d/c home today with f/u in 2 weeks

## 2021-09-04 ENCOUNTER — Telehealth: Payer: Self-pay

## 2021-09-04 NOTE — Telephone Encounter (Signed)
Transition Care Management Unsuccessful Follow-up Telephone Call  Date of discharge and from where:  09/01/2021 from [facility]  Attempts:  1st Attempt  Reason for unsuccessful TCM follow-up call:  Unable to leave message

## 2021-09-06 ENCOUNTER — Encounter (HOSPITAL_COMMUNITY): Payer: Self-pay | Admitting: Orthopedic Surgery

## 2021-09-07 NOTE — Telephone Encounter (Signed)
Transition Care Management Unsuccessful Follow-up Telephone Call  Date of discharge and from where:  09/01/2021 from [facility]  Attempts:  2nd Attempt  Reason for unsuccessful TCM follow-up call:  Unable to leave message

## 2021-09-08 NOTE — Telephone Encounter (Signed)
Transition Care Management Follow-up Telephone Call Date of discharge and from where: 09/01/2021-[facility]  How have you been since you were released from the hospital? Pt stated he is doing fine and has scheduled his follow up appointment.  Any questions or concerns? No  Items Reviewed: Did the pt receive and understand the discharge instructions provided? Yes  Medications obtained and verified? Yes  Other? No  Any new allergies since your discharge? No  Dietary orders reviewed? No Do you have support at home? Yes   Home Care and Equipment/Supplies: Were home health services ordered? not applicable If so, what is the name of the agency? N/A  Has the agency set up a time to come to the patient's home? not applicable Were any new equipment or medical supplies ordered?  No What is the name of the medical supply agency? N/A Were you able to get the supplies/equipment? not applicable Do you have any questions related to the use of the equipment or supplies? No  Functional Questionnaire: (I = Independent and D = Dependent) ADLs: I  Bathing/Dressing- I  Meal Prep- I  Eating- I  Maintaining continence- I  Transferring/Ambulation- I  Managing Meds- I  Follow up appointments reviewed:  PCP Hospital f/u appt confirmed? No   Specialist Hospital f/u appt confirmed? No   Are transportation arrangements needed? No  If their condition worsens, is the pt aware to call PCP or go to the Emergency Dept.? Yes Was the patient provided with contact information for the PCP's office or ED? Yes Was to pt encouraged to call back with questions or concerns? Yes

## 2021-09-18 NOTE — Discharge Summary (Signed)
Patient ID: Richard Gross MRN: [id number] DOB/AGE: January 11, 1961 61 y.o.  Admit date: 08/31/2021 Discharge date: 09/01/2021  Admission Diagnoses:  Principal Problem:   Radiculopathy   Discharge Diagnoses:  Same  Past Medical History:  Diagnosis Date   Complication of anesthesia    History of kidney stones    PONV (postoperative nausea and vomiting)     Surgeries: Procedure(s): LEFT-SIDED LATERAL INTERBODY FUSION DECOMPRESSION LUMBAR 3 - LUMBAR 4 WITH INSTRUMENTATION AND ALLOGRAFT on 08/31/2021   Consultants: None  Discharged Condition: Improved  Hospital Course: Richard Gross is an 61 y.o. male who was admitted 08/31/2021 for operative treatment of Radiculopathy. Patient has severe unremitting pain that affects sleep, daily activities, and work/hobbies. After pre-op clearance the patient was taken to the operating room on 08/31/2021 and underwent  Procedure(s): LEFT-SIDED LATERAL INTERBODY FUSION DECOMPRESSION LUMBAR 3 - LUMBAR 4 WITH INSTRUMENTATION AND ALLOGRAFT.    Patient was given perioperative antibiotics:  Anti-infectives (From admission, onward)    Start     Dose/Rate Route Frequency Ordered Stop   08/31/21 1830  vancomycin (VANCOREADY) IVPB 1500 mg/300 mL        1,500 mg 150 mL/hr over 120 Minutes Intravenous  Once 08/31/21 1438 08/31/21 1949   08/31/21 0700  vancomycin (VANCOREADY) IVPB 1500 mg/300 mL        1,500 mg 150 mL/hr over 120 Minutes Intravenous On call to O.R. 08/30/21 1214 08/31/21 0815        Patient was given sequential compression devices, early ambulation to prevent DVT.  Patient benefited maximally from hospital stay and there were no complications.    Recent vital signs: BP 113/60 (BP Location: Right Arm)    Pulse 97    Temp 99.3 F (37.4 C) (Oral)    Resp 18    Ht '5\' 9"'$  (1.753 m)    Wt 113.4 kg    SpO2 94%    BMI 36.92 kg/m    Discharge Medications:   Allergies as of 09/01/2021       Reactions   Amoxicillin Shortness Of Breath, Other (See  Comments)   Flushing Did it involve swelling of the face/tongue/throat, SOB, or low BP? Yes Did it involve sudden or severe rash/hives, skin peeling, or any reaction on the inside of your mouth or nose? No Did you need to seek medical attention at a hospital or doctor's office? Yes When did it last happen? 8 years ago If all above answers are NO, may proceed with cephalosporin use.   Statins Other (See Comments)   Made pt feel loopy        Medication List     TAKE these medications    acetaminophen 500 MG tablet Commonly known as: TYLENOL Take 500 mg by mouth every 8 (eight) hours as needed for moderate pain.   fenofibrate 160 MG tablet TAKE 1 TABLET BY MOUTH EVERY DAY   methocarbamol 500 MG tablet Commonly known as: ROBAXIN Take 1 tablet (500 mg total) by mouth every 6 (six) hours as needed for muscle spasms.   multivitamin tablet Take 1 tablet by mouth daily.   oxyCODONE-acetaminophen 5-325 MG tablet Commonly known as: PERCOCET/ROXICET Take 1-2 tablets by mouth every 4 (four) hours as needed for severe pain.   tadalafil 5 MG tablet Commonly known as: CIALIS TAKE 1 TABLET BY MOUTH EVERY DAY   topiramate 50 MG tablet Commonly known as: TOPAMAX TAKE 1 TABLET BY MOUTH EVERYDAY AT BEDTIME        Diagnostic Studies: DG  Lumbar Spine Complete  Result Date: 08/31/2021 CLINICAL DATA:  Status post surgical fusion of L3-4. EXAM: LUMBAR SPINE - COMPLETE 4+ VIEW; DG C-ARM 1-60 MIN-NO REPORT COMPARISON:  August 08, 2021. FINDINGS: Four intraoperative fluoroscopic images were obtained of the lower lumbar spine. These demonstrate lateral and posterior fusion of L3-4 with bilateral intrapedicular screw placement. IMPRESSION: Fluoroscopic guidance provided during lower lumbar fusion. Electronically Signed   By: Marijo Conception M.D.   On: 08/31/2021 12:29   DG C-Arm 1-60 Min-No Report  Result Date: 08/31/2021 Fluoroscopy was utilized by the requesting physician.  No  radiographic interpretation.   DG C-Arm 1-60 Min-No Report  Result Date: 08/31/2021 Fluoroscopy was utilized by the requesting physician.  No radiographic interpretation.   DG C-Arm 1-60 Min-No Report  Result Date: 08/31/2021 Fluoroscopy was utilized by the requesting physician.  No radiographic interpretation.   DG C-Arm 1-60 Min-No Report  Result Date: 08/31/2021 Fluoroscopy was utilized by the requesting physician.  No radiographic interpretation.    Disposition: Discharge disposition: 01-Home or Self Care       Discharge Instructions     Discharge patient   Complete by: As directed    Discharge disposition: 01-Home or Self Care   Discharge patient date: 09/01/2021      POD #1 s/p L3/4 LAT/POST fusion, doing well, improved strength with still some foot weakness no unexpected    - up with PT/OT, encourage ambulation - Percocet for pain, Robaxin for muscle spasms -Scripts for pain sent to pharmacy electronically  -D/C instructions sheet printed and in chart -D/C today  -F/U in office 2 weeks   Signed: Lennie Muckle Azzan Butler 09/18/2021, 5:05 PM

## 2021-11-26 ENCOUNTER — Other Ambulatory Visit: Payer: Self-pay | Admitting: Sports Medicine

## 2021-12-11 ENCOUNTER — Other Ambulatory Visit: Payer: Self-pay | Admitting: Sports Medicine

## 2021-12-26 ENCOUNTER — Other Ambulatory Visit: Payer: Self-pay | Admitting: Orthopedic Surgery

## 2021-12-26 DIAGNOSIS — G8929 Other chronic pain: Secondary | ICD-10-CM

## 2022-01-10 ENCOUNTER — Other Ambulatory Visit: Payer: Self-pay | Admitting: Sports Medicine

## 2022-01-23 ENCOUNTER — Encounter: Payer: PRIVATE HEALTH INSURANCE | Admitting: Sports Medicine

## 2022-01-25 ENCOUNTER — Ambulatory Visit
Admission: RE | Admit: 2022-01-25 | Discharge: 2022-01-25 | Disposition: A | Discharge status: Home / Self Care | Payer: PRIVATE HEALTH INSURANCE | Source: Ambulatory Visit | Attending: Orthopedic Surgery | Admitting: Orthopedic Surgery

## 2022-01-25 DIAGNOSIS — M545 Low back pain, unspecified: Secondary | ICD-10-CM

## 2022-02-08 ENCOUNTER — Other Ambulatory Visit: Payer: Self-pay | Admitting: Sports Medicine

## 2022-03-13 ENCOUNTER — Other Ambulatory Visit: Payer: Self-pay | Admitting: Sports Medicine

## 2022-03-17 ENCOUNTER — Other Ambulatory Visit: Payer: Self-pay | Admitting: Sports Medicine

## 2022-03-17 DIAGNOSIS — N529 Male erectile dysfunction, unspecified: Secondary | ICD-10-CM

## 2022-03-17 DIAGNOSIS — M1712 Unilateral primary osteoarthritis, left knee: Secondary | ICD-10-CM

## 2022-03-20 ENCOUNTER — Encounter: Payer: Self-pay | Admitting: Sports Medicine

## 2022-03-20 DIAGNOSIS — Z1211 Encounter for screening for malignant neoplasm of colon: Secondary | ICD-10-CM

## 2022-03-20 DIAGNOSIS — M1712 Unilateral primary osteoarthritis, left knee: Secondary | ICD-10-CM

## 2022-03-21 MED ORDER — DICLOFENAC SODIUM 75 MG PO TBEC: 75.0000 mg | DELAYED_RELEASE_TABLET | Freq: Two times a day (BID) | ORAL | 3 refills | Status: DC

## 2022-03-23 ENCOUNTER — Other Ambulatory Visit: Payer: Self-pay | Admitting: Sports Medicine

## 2022-03-23 DIAGNOSIS — N529 Male erectile dysfunction, unspecified: Secondary | ICD-10-CM

## 2022-03-23 NOTE — Addendum Note (Signed)
Addended by: Narda Rutherford on: 03/23/2022 10:15 AM   Modules accepted: Orders

## 2022-04-03 ENCOUNTER — Other Ambulatory Visit: Payer: Self-pay | Admitting: Orthopedic Surgery

## 2022-04-03 DIAGNOSIS — M545 Low back pain, unspecified: Secondary | ICD-10-CM

## 2022-04-05 ENCOUNTER — Encounter: Payer: Self-pay | Admitting: Gastroenterology

## 2022-04-06 ENCOUNTER — Encounter: Payer: Self-pay | Admitting: Gastroenterology

## 2022-04-06 ENCOUNTER — Ambulatory Visit (INDEPENDENT_AMBULATORY_CARE_PROVIDER_SITE_OTHER): Payer: PRIVATE HEALTH INSURANCE | Admitting: Gastroenterology

## 2022-04-06 VITALS — BP 144/74 | HR 95 | Ht 68.0 in | Wt 249.1 lb

## 2022-04-06 DIAGNOSIS — Z1211 Encounter for screening for malignant neoplasm of colon: Secondary | ICD-10-CM

## 2022-04-06 DIAGNOSIS — R195 Other fecal abnormalities: Secondary | ICD-10-CM

## 2022-04-06 DIAGNOSIS — R152 Fecal urgency: Secondary | ICD-10-CM | POA: Diagnosis not present

## 2022-04-06 NOTE — Progress Notes (Signed)
Chief Complaint: Change in bowel habits, mucus-like stools   Referring Provider:     Silverio Decamp, MD    HPI:     Richard Gross is a 61 y.o. male referred to the Gastroenterology Clinic for evaluation of change in bowel habits and mucus-like stools.  He reports having mucus like stools in the last 6 months. No hematochezia, abdominal pain. +post prandial urgency, but unrelated to food types. Increased stool frequency now as well, from baseline had been 4-5, and now 5-6. No nocturnal stools.   No upper GI sxs, weight loss.   Last colonoscopy was 2009 in [place], [place] and normal per patient. ColoGuard negative 5 years ago. EGD in 2009 which was normal per patient. No reports available for review.   No known family history of CRC, GI malignancy, liver disease, pancreatic disease, or IBD.        Latest Ref Rng & Units 08/29/2021    8:26 AM 01/18/2021   12:00 AM 04/24/2019   10:33 AM  CBC  WBC 4.0 - 10.5 K/uL 4.4  4.9  5.3   Hemoglobin 13.0 - 17.0 g/dL 15.1  16.2  16.3   Hematocrit 39.0 - 52.0 % 45.0  48.4  47.9   Platelets 150 - 400 K/uL 201  228  260       Latest Ref Rng & Units 08/29/2021    8:26 AM 01/18/2021   12:00 AM 04/24/2019   10:33 AM  BMP  Glucose 70 - 99 mg/dL 109  95  93   BUN 8 - 23 mg/dL '22  26  21   '$ Creatinine 0.61 - 1.24 mg/dL 1.15  1.26  1.09   BUN/Creat Ratio 6 - 22 (calc)  21  NOT APPLICABLE   Sodium 027 - 145 mmol/L 142  142  143   Potassium 3.5 - 5.1 mmol/L 4.2  4.4  4.5   Chloride 98 - 111 mmol/L 111  110  107   CO2 22 - 32 mmol/L '24  23  27   '$ Calcium 8.9 - 10.3 mg/dL 9.6  9.4  9.7      Past Medical History:  Diagnosis Date   Complication of anesthesia    History of kidney stones    PONV (postoperative nausea and vomiting)      Past Surgical History:  Procedure Laterality Date   ANTERIOR LAT LUMBAR FUSION Left 08/31/2021   Procedure: LEFT-SIDED LATERAL INTERBODY FUSION DECOMPRESSION LUMBAR 3 - LUMBAR 4 WITH  INSTRUMENTATION AND ALLOGRAFT;  Surgeon: Phylliss Bob, MD;  Location: [place];  Service: Orthopedics;  Laterality: Left;   LUMBAR LAMINECTOMY/DECOMPRESSION MICRODISCECTOMY N/A 12/31/2018   Procedure: LUMBAR 2 - LUMBAR 5 DECOMPRESSION;  Surgeon: Phylliss Bob, MD;  Location: [place];  Service: Orthopedics;  Laterality: N/A;   TONSILLECTOMY     widsom teeth     Family History  Problem Relation Age of Onset   Cancer Mother    AAA (abdominal aortic aneurysm) Mother    AAA (abdominal aortic aneurysm) Father    Cancer Father    Heart disease Sister    Diabetes Sister    Hyperlipidemia Sister    Hypertension Sister    Heart disease Brother    Diabetes Brother    Hyperlipidemia Brother    Hypertension Brother    Social History   Tobacco Use   Smoking status: Former    Types: Cigarettes    Quit date: 07/17/1995  Years since quitting: 26.7   Smokeless tobacco: Never  Vaping Use   Vaping Use: Never used  Substance Use Topics   Alcohol use: No   Drug use: No   Current Outpatient Medications  Medication Sig Dispense Refill   acetaminophen (TYLENOL) 500 MG tablet Take 500 mg by mouth every 8 (eight) hours as needed for moderate pain.     diclofenac (VOLTAREN) 75 MG EC tablet Take 1 tablet (75 mg total) by mouth 2 (two) times daily. 180 tablet 3   fenofibrate 160 MG tablet Take 1 tablet (160 mg total) by mouth daily. NO MORE REFILLS UNTIL SEEN IN OFFICE. 30 tablet 0   Multiple Vitamin (MULTIVITAMIN) tablet Take 1 tablet by mouth daily.     tadalafil (CIALIS) 5 MG tablet TAKE 1 TABLET BY MOUTH EVERY DAY 30 tablet 5   methocarbamol (ROBAXIN) 500 MG tablet Take 1 tablet (500 mg total) by mouth every 6 (six) hours as needed for muscle spasms. 30 tablet 2   oxyCODONE-acetaminophen (PERCOCET/ROXICET) 5-325 MG tablet Take 1-2 tablets by mouth every 4 (four) hours as needed for severe pain. 30 tablet 0   topiramate (TOPAMAX) 50 MG tablet TAKE 1 TABLET BY MOUTH EVERYDAY AT BEDTIME (Patient not  taking: Reported on 04/06/2022) 30 tablet 5   No current facility-administered medications for this visit.   Allergies  Allergen Reactions   Amoxicillin Shortness Of Breath and Other (See Comments)    Flushing Did it involve swelling of the face/tongue/throat, SOB, or low BP? Yes Did it involve sudden or severe rash/hives, skin peeling, or any reaction on the inside of your mouth or nose? No Did you need to seek medical attention at a hospital or doctor's office? Yes When did it last happen? 8 years ago If all above answers are "NO", may proceed with cephalosporin use.   Statins Other (See Comments)    Made pt feel loopy     Review of Systems: All systems reviewed and negative except where noted in HPI.     Physical Exam:    Wt Readings from Last 3 Encounters:  04/06/22 249 lb 2 oz (113 kg)  08/31/21 250 lb (113.4 kg)  08/29/21 255 lb 14.4 oz (116.1 kg)    BP (!) 144/74   Pulse 95   Ht '5\' 8"'$  (1.727 m)   Wt 249 lb 2 oz (113 kg)   BMI 37.88 kg/m  Constitutional:  Pleasant, in no acute distress. Psychiatric: Normal mood and affect. Behavior is normal. Cardiovascular: Normal rate, regular rhythm. No edema Pulmonary/chest: Effort normal and breath sounds normal. No wheezing, rales or rhonchi. Abdominal: Soft, nondistended, nontender. Bowel sounds active throughout. There are no masses palpable. No hepatomegaly. Neurological: Alert and oriented to person place and time. Skin: Skin is warm and dry. No rashes noted.   ASSESSMENT AND PLAN;   1) Change in bowel habits 2) Mucus-like stool 3) Fecal urgency - Colonoscopy to evaluate for mucosal/luminal pathology with random and directed biopsies to r/o Microscopic Colitis, IBD, etc. - Trial course of probiotics - Trial of low FODMAP diet - Depending on response and colonoscopy findings, can also trial fiber supplement  4) Colon cancer screening - Due for ongoing CRC screening - Colonoscopy as above  The indications,  risks, and benefits of colonoscopy were explained to the patient in detail. Risks include but are not limited to bleeding, perforation, adverse reaction to medications, and cardiopulmonary compromise. Sequelae include but are not limited to the possibility of surgery, hospitalization, and  mortality. The patient verbalized understanding and wished to proceed. All questions answered, referred to the scheduler and bowel prep ordered. Further recommendations pending results of the exam.     Lavena Bullion, DO, FACG  04/06/2022, 2:31 PM   Silverio Decamp,*

## 2022-04-06 NOTE — Patient Instructions (Addendum)
_______________________________________________________  If you are age 62 or older, your body mass index should be between 23-30. Your Body mass index is 37.88 kg/m. If this is out of the aforementioned range listed, please consider follow up with your Primary Care Provider.  If you are age 85 or younger, your body mass index should be between 19-25. Your Body mass index is 37.88 kg/m. If this is out of the aformentioned range listed, please consider follow up with your Primary Care Provider.   ________________________________________________________  The [facility] providers would like to encourage you to use [facility] to communicate with providers for non-urgent requests or questions.  Due to long hold times on the telephone, sending your provider a message by [facility] may be a faster and more efficient way to get a response.  Please allow 48 business hours for a response.  Please remember that this is for non-urgent requests.  _______________________________________________________  Dennis Bast have been scheduled for a colonoscopy. Please follow written instructions given to you at your visit today.  Please pick up your prep supplies at the pharmacy within the next 1-3 days. If you use inhalers (even only as needed), please bring them with you on the day of your procedure.  We have given you a sample of Clenpiq for your prep.  Take Probiotics ( Align) for 4 weeks.  Thank you for choosing me and [facility].  Vito Cirigliano, D.O.

## 2022-04-17 ENCOUNTER — Telehealth: Payer: Self-pay | Admitting: Gastroenterology

## 2022-04-17 NOTE — Telephone Encounter (Signed)
Agreed -

## 2022-04-17 NOTE — Telephone Encounter (Signed)
Returned pt's call. Instructed him to continue to hold medication until after procedure. Ok to proceed with colonoscopy tomorrow.

## 2022-04-17 NOTE — Telephone Encounter (Signed)
Inbound call from patient , he have a procedure 04/18/2022 , he was supposed to stop taking "Diclofenac"7 days prior Colonoscopy but he's been taking it 2 time at day, last time he took it was yesterday at 6:45 am, not sure if he should keep appointment. Please advise

## 2022-04-18 ENCOUNTER — Encounter: Payer: Self-pay | Admitting: Gastroenterology

## 2022-04-18 ENCOUNTER — Ambulatory Visit (AMBULATORY_SURGERY_CENTER): Payer: PRIVATE HEALTH INSURANCE | Admitting: Gastroenterology

## 2022-04-18 VITALS — BP 124/61 | HR 70 | Temp 97.5°F | Resp 13

## 2022-04-18 DIAGNOSIS — K640 First degree hemorrhoids: Secondary | ICD-10-CM

## 2022-04-18 DIAGNOSIS — D125 Benign neoplasm of sigmoid colon: Secondary | ICD-10-CM

## 2022-04-18 DIAGNOSIS — D123 Benign neoplasm of transverse colon: Secondary | ICD-10-CM | POA: Diagnosis not present

## 2022-04-18 DIAGNOSIS — K635 Polyp of colon: Secondary | ICD-10-CM | POA: Diagnosis not present

## 2022-04-18 DIAGNOSIS — D120 Benign neoplasm of cecum: Secondary | ICD-10-CM

## 2022-04-18 DIAGNOSIS — K649 Unspecified hemorrhoids: Secondary | ICD-10-CM | POA: Diagnosis not present

## 2022-04-18 DIAGNOSIS — R195 Other fecal abnormalities: Secondary | ICD-10-CM | POA: Diagnosis present

## 2022-04-18 DIAGNOSIS — D122 Benign neoplasm of ascending colon: Secondary | ICD-10-CM

## 2022-04-18 DIAGNOSIS — K5140 Inflammatory polyps of colon without complications: Secondary | ICD-10-CM

## 2022-04-18 DIAGNOSIS — K5730 Diverticulosis of large intestine without perforation or abscess without bleeding: Secondary | ICD-10-CM | POA: Diagnosis not present

## 2022-04-18 DIAGNOSIS — R152 Fecal urgency: Secondary | ICD-10-CM

## 2022-04-18 MED ORDER — SODIUM CHLORIDE 0.9 % IV SOLN: 500.0000 mL | Freq: Once | INTRAVENOUS | Status: DC

## 2022-04-18 NOTE — Progress Notes (Signed)
Called to room to assist during endoscopic procedure.  Patient ID and intended procedure confirmed with present staff. Received instructions for my participation in the procedure from the performing physician.  

## 2022-04-18 NOTE — Progress Notes (Signed)
Sedate, gd SR, tolerated procedure well, VSS, report to RN 

## 2022-04-18 NOTE — Progress Notes (Signed)
GASTROENTEROLOGY PROCEDURE H&P NOTE   Primary Care Physician: Silverio Decamp, MD    Reason for Procedure:   Change in bowel habits, mucus-like stools, CRC screening  Plan:    Colonoscopy  Patient is appropriate for endoscopic procedure(s) in the ambulatory ([place]) setting.  The nature of the procedure, as well as the risks, benefits, and alternatives were carefully and thoroughly reviewed with the patient. Ample time for discussion and questions allowed. The patient understood, was satisfied, and agreed to proceed.     HPI: Richard Gross is a 61 y.o. male who presents for Colonoscopy for evaluation of Change in bowel habits, mucus-like stools and CRC screening.  Patient was most recently seen in the Gastroenterology Clinic on 04/06/2022 by me.  No interval change in medical history since that appointment. Please refer to that note for full details regarding GI history and clinical presentation.   Past Medical History:  Diagnosis Date   Complication of anesthesia    History of kidney stones    PONV (postoperative nausea and vomiting)     Past Surgical History:  Procedure Laterality Date   ANTERIOR LAT LUMBAR FUSION Left 08/31/2021   Procedure: LEFT-SIDED LATERAL INTERBODY FUSION DECOMPRESSION LUMBAR 3 - LUMBAR 4 WITH INSTRUMENTATION AND ALLOGRAFT;  Surgeon: Phylliss Bob, MD;  Location: [place];  Service: Orthopedics;  Laterality: Left;   LUMBAR LAMINECTOMY/DECOMPRESSION MICRODISCECTOMY N/A 12/31/2018   Procedure: LUMBAR 2 - LUMBAR 5 DECOMPRESSION;  Surgeon: Phylliss Bob, MD;  Location: [place];  Service: Orthopedics;  Laterality: N/A;   TONSILLECTOMY     widsom teeth      Prior to Admission medications   Medication Sig Start Date End Date Taking? Authorizing Provider  fenofibrate 160 MG tablet Take 1 tablet (160 mg total) by mouth daily. NO MORE REFILLS UNTIL SEEN IN OFFICE. 02/09/22  Yes Silverio Decamp, MD  Multiple Vitamin (MULTIVITAMIN) tablet Take 1 tablet by  mouth daily.   Yes [provider]  acetaminophen (TYLENOL) 500 MG tablet Take 500 mg by mouth every 8 (eight) hours as needed for moderate pain.    [provider]  diclofenac (VOLTAREN) 75 MG EC tablet Take 1 tablet (75 mg total) by mouth 2 (two) times daily. 03/21/22 03/21/23  Silverio Decamp, MD  methocarbamol (ROBAXIN) 500 MG tablet Take 1 tablet (500 mg total) by mouth every 6 (six) hours as needed for muscle spasms. 08/31/21   Phylliss Bob, MD  oxyCODONE-acetaminophen (PERCOCET/ROXICET) 5-325 MG tablet Take 1-2 tablets by mouth every 4 (four) hours as needed for severe pain. 08/31/21   Phylliss Bob, MD  tadalafil (CIALIS) 5 MG tablet TAKE 1 TABLET BY MOUTH EVERY DAY 05/01/21   Silverio Decamp, MD  topiramate (TOPAMAX) 50 MG tablet TAKE 1 TABLET BY MOUTH EVERYDAY AT BEDTIME Patient not taking: Reported on 04/06/2022 07/13/21   Silverio Decamp, MD    Current Outpatient Medications  Medication Sig Dispense Refill   fenofibrate 160 MG tablet Take 1 tablet (160 mg total) by mouth daily. NO MORE REFILLS UNTIL SEEN IN OFFICE. 30 tablet 0   Multiple Vitamin (MULTIVITAMIN) tablet Take 1 tablet by mouth daily.     acetaminophen (TYLENOL) 500 MG tablet Take 500 mg by mouth every 8 (eight) hours as needed for moderate pain.     diclofenac (VOLTAREN) 75 MG EC tablet Take 1 tablet (75 mg total) by mouth 2 (two) times daily. 180 tablet 3   methocarbamol (ROBAXIN) 500 MG tablet Take 1 tablet (500 mg total)  by mouth every 6 (six) hours as needed for muscle spasms. 30 tablet 2   oxyCODONE-acetaminophen (PERCOCET/ROXICET) 5-325 MG tablet Take 1-2 tablets by mouth every 4 (four) hours as needed for severe pain. 30 tablet 0   tadalafil (CIALIS) 5 MG tablet TAKE 1 TABLET BY MOUTH EVERY DAY 30 tablet 5   topiramate (TOPAMAX) 50 MG tablet TAKE 1 TABLET BY MOUTH EVERYDAY AT BEDTIME (Patient not taking: Reported on 04/06/2022) 30 tablet 5   Current Facility-Administered  Medications  Medication Dose Route Frequency Provider Last Rate Last Admin   0.9 %  sodium chloride infusion  500 mL Intravenous Once Chrisha Vogel V, DO        Allergies as of 04/18/2022 - Review Complete 04/18/2022  Allergen Reaction Noted   Amoxicillin Shortness Of Breath and Other (See Comments) 02/18/2012   Statins Other (See Comments) 12/01/2012    Family History  Problem Relation Age of Onset   Cancer Mother    AAA (abdominal aortic aneurysm) Mother    AAA (abdominal aortic aneurysm) Father    Cancer Father    Heart disease Sister    Diabetes Sister    Hyperlipidemia Sister    Hypertension Sister    Heart disease Brother    Diabetes Brother    Hyperlipidemia Brother    Hypertension Brother     Social History   Socioeconomic History   Marital status: Married    Spouse name: Not on file   Number of children: Not on file   Years of education: Not on file   Highest education level: Not on file  Occupational History   Not on file  Tobacco Use   Smoking status: Former    Types: Cigarettes    Quit date: 07/17/1995    Years since quitting: 26.7   Smokeless tobacco: Never  Vaping Use   Vaping Use: Never used  Substance and Sexual Activity   Alcohol use: No   Drug use: No   Sexual activity: Not on file  Other Topics Concern   Not on file  Social History Narrative   Not on file   Social Determinants of Health   Financial Resource Strain: Not on file  Food Insecurity: Not on file  Transportation Needs: Not on file  Physical Activity: Not on file  Stress: Not on file  Social Connections: Not on file  Intimate Partner Violence: Not on file    Physical Exam: Vital signs in last 24 hours: '@Temp'$  (!) 97.5 F (36.4 C)  GEN: NAD EYE: Sclerae anicteric ENT: MMM CV: Non-tachycardic Pulm: CTA b/l GI: Soft, NT/ND NEURO:  Alert & Oriented x [place], DO [facility]   04/18/2022 8:44 AM

## 2022-04-18 NOTE — Progress Notes (Signed)
Pt's states no medical or surgical changes since previsit or office visit. 

## 2022-04-18 NOTE — Op Note (Signed)
[facility] Patient Name: Richard Gross Procedure Date: 04/18/2022 9:17 AM MRN: [id number] Endoscopist: Gerrit Heck , MD Age: 61 Referring MD:  Date of Birth: [birth year] Gender: Male Account #: [id number] Procedure:                Colonoscopy Indications:              Change in bowel habits (mucus-like stools,                            increased stool frequency, post prandial fecal                            urgency)                           Also due for ongoing colon cancer screening. Last                            colonoscopy was 2009 and normal. Medicines:                Monitored Anesthesia Care Procedure:                Pre-Anesthesia Assessment:                           - Prior to the procedure, a History and Physical                            was performed, and patient medications and                            allergies were reviewed. The patient's tolerance of                            previous anesthesia was also reviewed. The risks                            and benefits of the procedure and the sedation                            options and risks were discussed with the patient.                            All questions were answered, and informed consent                            was obtained. Prior Anticoagulants: The patient has                            taken no previous anticoagulant or antiplatelet                            agents. ASA Grade Assessment: II - A patient with  mild systemic disease. After reviewing the risks                            and benefits, the patient was deemed in                            satisfactory condition to undergo the procedure.                           After obtaining informed consent, the colonoscope                            was passed under direct vision. Throughout the                            procedure, the patient's blood pressure, pulse, and                            oxygen  saturations were monitored continuously. The                            Olympus CF-HQ190L ([id number]) Colonoscope was                            introduced through the anus and advanced to the the                            terminal ileum. The colonoscopy was performed                            without difficulty. The patient tolerated the                            procedure well. The quality of the bowel                            preparation was good. The terminal ileum, ileocecal                            valve, appendiceal orifice, and rectum were                            photographed. Scope In: 9:21:58 AM Scope Out: 9:56:34 AM Scope Withdrawal Time: 0 hours 32 minutes 19 seconds  Total Procedure Duration: 0 hours 34 minutes 36 seconds  Findings:                 The perianal and digital rectal examinations were                            normal.                           A 5 mm polyp was found in the cecum. The polyp was  sessile. The polyp was removed with a cold snare.                            Resection and retrieval were complete. Estimated                            blood loss was minimal.                           A 12 mm polyp was found in the transverse colon.                            The polyp was flat. The polyp was removed with a                            cold snare. Resection and retrieval were complete.                            Estimated blood loss was minimal.                           A 6 mm polyp was found in the sigmoid colon. The                            polyp was sessile and located on the rim of a                            diverticulum. The polyp was removed with a cold                            snare. Given location in relation to a                            diverticulum, cold forceps were then used to resect                            the edges via avulsion technique. Resection and                            retrieval  were complete. Estimated blood loss was                            minimal.                           Multiple small and large-mouthed diverticula were                            found in the sigmoid colon.                           The mucosa was otherwise normal appearing  throughout the colon. No areas of mucosal erythema,                            edema, erosions, or ulceration. Biopsies for                            histology were taken with a cold forceps from the                            right colon and left colon for evaluation of                            microscopic colitis. Estimated blood loss was                            minimal.                           Non-bleeding internal hemorrhoids were found during                            retroflexion. The hemorrhoids were small.                           The terminal ileum appeared normal. Complications:            No immediate complications. Estimated Blood Loss:     Estimated blood loss was minimal. Impression:               - One 5 mm polyp in the cecum, removed with a cold                            snare. Resected and retrieved.                           - One 12 mm polyp in the transverse colon, removed                            with a cold snare. Resected and retrieved.                           - One 6 mm polyp in the sigmoid colon, removed with                            a combination of cold snare and cold forceps.                            Resected and retrieved.                           - Diverticulosis in the sigmoid colon.                           - Normal mucosa in the entire examined colon.  Biopsied.                           - Non-bleeding internal hemorrhoids.                           - The examined portion of the ileum was normal. Recommendation:           - Patient has a contact number available for                            emergencies. The  signs and symptoms of potential                            delayed complications were discussed with the                            patient. Return to normal activities tomorrow.                            Written discharge instructions were provided to the                            patient.                           - Resume previous diet.                           - Continue present medications.                           - Await pathology results.                           - Repeat colonoscopy for surveillance based on                            pathology results.                           - Use fiber, for example Citrucel, Fibercon, Konsyl                            or Metamucil.                           - Return to GI clinic in 3 months or sooner prn. Gerrit Heck, MD 04/18/2022 10:05:41 AM

## 2022-04-18 NOTE — Patient Instructions (Signed)
Information on polyps, diverticulosis and hemorrhoids given toyou today.  Await pathology results.  Resume previous diet and medications.  Use fiber, for example Citrucel, FiberCon, Konsyl or Metamucil.  Return to GI clinic in 3 months or sooner prn.   YOU HAD AN ENDOSCOPIC PROCEDURE TODAY AT [facility]:   Refer to the procedure report that was given to you for any specific questions about what was found during the examination.  If the procedure report does not answer your questions, please call your gastroenterologist to clarify.  If you requested that your care partner not be given the details of your procedure findings, then the procedure report has been included in a sealed envelope for you to review at your convenience later.  YOU SHOULD EXPECT: Some feelings of bloating in the abdomen. Passage of more gas than usual.  Walking can help get rid of the air that was put into your GI tract during the procedure and reduce the bloating. If you had a lower endoscopy (such as a colonoscopy or flexible sigmoidoscopy) you may notice spotting of blood in your stool or on the toilet paper. If you underwent a bowel prep for your procedure, you may not have a normal bowel movement for a few days.  Please Note:  You might notice some irritation and congestion in your nose or some drainage.  This is from the oxygen used during your procedure.  There is no need for concern and it should clear up in a day or so.  SYMPTOMS TO REPORT IMMEDIATELY:  Following lower endoscopy (colonoscopy or flexible sigmoidoscopy):  Excessive amounts of blood in the stool  Significant tenderness or worsening of abdominal pains  Swelling of the abdomen that is new, acute  Fever of 100F or higher   For urgent or emergent issues, a gastroenterologist can be reached at any hour by calling [phone]. Do not use MyChart messaging for urgent concerns.    DIET:  We do recommend a small meal at first, but  then you may proceed to your regular diet.  Drink plenty of fluids but you should avoid alcoholic beverages for 24 hours.  ACTIVITY:  You should plan to take it easy for the rest of today and you should NOT DRIVE or use heavy machinery until tomorrow (because of the sedation medicines used during the test).    FOLLOW UP: Our staff will call the number listed on your records the next business day following your procedure.  We will call around 7:15- 8:00 am to check on you and address any questions or concerns that you may have regarding the information given to you following your procedure. If we do not reach you, we will leave a message.     If any biopsies were taken you will be contacted by phone or by letter within the next 1-3 weeks.  Please call us at [phone] if you have not heard about the biopsies in 3 weeks.    SIGNATURES/CONFIDENTIALITY: You and/or your care partner have signed paperwork which will be entered into your electronic medical record.  These signatures attest to the fact that that the information above on your After Visit Summary has been reviewed and is understood.  Full responsibility of the confidentiality of this discharge information lies with you and/or your care-partner.

## 2022-04-19 ENCOUNTER — Telehealth: Payer: Self-pay | Admitting: *Deleted

## 2022-04-19 NOTE — Telephone Encounter (Signed)
  Follow up Call-     04/18/2022    8:09 AM  Call back number  Post procedure Call Back phone  # [phone]  Permission to leave phone message Yes     Patient questions: Message left to call us if necessary.

## 2022-04-24 ENCOUNTER — Encounter: Payer: Self-pay | Admitting: Gastroenterology

## 2022-04-26 ENCOUNTER — Ambulatory Visit
Admission: RE | Admit: 2022-04-26 | Discharge: 2022-04-26 | Disposition: A | Discharge status: Home / Self Care | Payer: PRIVATE HEALTH INSURANCE | Source: Ambulatory Visit | Attending: Orthopedic Surgery | Admitting: Orthopedic Surgery

## 2022-04-26 DIAGNOSIS — M545 Low back pain, unspecified: Secondary | ICD-10-CM

## 2022-04-27 ENCOUNTER — Ambulatory Visit (INDEPENDENT_AMBULATORY_CARE_PROVIDER_SITE_OTHER): Payer: PRIVATE HEALTH INSURANCE | Admitting: Sports Medicine

## 2022-04-27 ENCOUNTER — Encounter: Payer: Self-pay | Admitting: Sports Medicine

## 2022-04-27 VITALS — BP 142/67 | HR 93 | Ht 68.0 in | Wt 245.0 lb

## 2022-04-27 DIAGNOSIS — H9113 Presbycusis, bilateral: Secondary | ICD-10-CM

## 2022-04-27 DIAGNOSIS — E783 Hyperchylomicronemia: Secondary | ICD-10-CM | POA: Diagnosis not present

## 2022-04-27 DIAGNOSIS — N139 Obstructive and reflux uropathy, unspecified: Secondary | ICD-10-CM | POA: Diagnosis not present

## 2022-04-27 DIAGNOSIS — H9110 Presbycusis, unspecified ear: Secondary | ICD-10-CM | POA: Insufficient documentation

## 2022-04-27 DIAGNOSIS — N529 Male erectile dysfunction, unspecified: Secondary | ICD-10-CM

## 2022-04-27 MED ORDER — TADALAFIL 5 MG PO TABS: 5.0000 mg | ORAL_TABLET | Freq: Every day | ORAL | 3 refills | Status: DC

## 2022-04-27 NOTE — Assessment & Plan Note (Signed)
Refilling medication, symptoms well controlled.

## 2022-04-27 NOTE — Assessment & Plan Note (Signed)
Persistently elevated triglycerides, continue fenofibrate, we have looked at his labs in over a year, ordering again.

## 2022-04-27 NOTE — Progress Notes (Signed)
    Procedures performed today:    None.  Independent interpretation of notes and tests performed by another provider:   None.  Brief History, Exam, Impression, and Recommendations:    Erectile dysfunction Refilling medication, symptoms well controlled.   Hyperlipidemia Persistently elevated triglycerides, continue fenofibrate, we have looked at his labs in over a year, ordering again.  Presbycusis Started to notice difficulty with understanding voices in busy environments. 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 He does wear hearing protection but not 100% of the time. He gets regular screenings with his job and there has been some hearing loss noted over time. He is ready to discuss hearing aids and would like a referral to audiology.    ____________________________________________ [name]. Dianah Field, M.D., ABFM., CAQSM., AME. Primary Care and Sports Medicine [facility]  Adjunct Professor of [place] [employer] [facility] of Medicine  Risk manager

## 2022-04-27 NOTE — Assessment & Plan Note (Signed)
Started to notice difficulty with understanding voices in busy environments. 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 He does wear hearing protection but not 100% of the time. He gets regular screenings with his job and there has been some hearing loss noted over time. He is ready to discuss hearing aids and would like a referral to audiology.

## 2022-05-21 ENCOUNTER — Other Ambulatory Visit: Payer: Self-pay | Admitting: Sports Medicine

## 2022-06-15 ENCOUNTER — Other Ambulatory Visit: Payer: Self-pay | Admitting: Sports Medicine

## 2022-06-19 ENCOUNTER — Other Ambulatory Visit: Payer: Self-pay | Admitting: Sports Medicine

## 2022-07-02 ENCOUNTER — Encounter: Payer: Self-pay | Admitting: Sports Medicine

## 2022-07-06 ENCOUNTER — Encounter: Payer: Self-pay | Admitting: Sports Medicine

## 2022-07-06 DIAGNOSIS — E783 Hyperchylomicronemia: Secondary | ICD-10-CM

## 2022-07-06 LAB — LIPID PANEL
Cholesterol: 224 mg/dL — ABNORMAL HIGH (ref <200)
HDL: 42 mg/dL (ref >=40)
LDL Cholesterol (Calc): 148 mg/dL (calc) — ABNORMAL HIGH
Non-HDL Cholesterol (Calc): 182 mg/dL (calc) — ABNORMAL HIGH (ref <130)
Total CHOL/HDL Ratio: 5.3 (calc) — ABNORMAL HIGH (ref <5.0)
Triglycerides: 206 mg/dL — ABNORMAL HIGH (ref <150)

## 2022-07-06 LAB — COMPLETE METABOLIC PANEL WITH GFR
AG Ratio: 2 (calc) (ref 1.0–2.5)
ALT: 27 U/L (ref 9–46)
AST: 19 U/L (ref 10–35)
Albumin: 4.2 g/dL (ref 3.6–5.1)
Alkaline phosphatase (APISO): 66 U/L (ref 35–144)
BUN: 23 mg/dL (ref 7–25)
CO2: 25 mmol/L (ref 20–32)
Calcium: 9.1 mg/dL (ref 8.6–10.3)
Chloride: 108 mmol/L (ref 98–110)
Creat: 0.91 mg/dL (ref 0.70–1.35)
Globulin: 2.1 g/dL (calc) (ref 1.9–3.7)
Glucose, Bld: 93 mg/dL (ref 65–99)
Potassium: 4.3 mmol/L (ref 3.5–5.3)
Sodium: 140 mmol/L (ref 135–146)
Total Bilirubin: 0.4 mg/dL (ref 0.2–1.2)
Total Protein: 6.3 g/dL (ref 6.1–8.1)
eGFR: 95 mL/min/{1.73_m2} (ref >=60)

## 2022-07-06 LAB — CBC
HCT: 44.7 % (ref 38.5–50.0)
Hemoglobin: 15.6 g/dL (ref 13.2–17.1)
MCH: 31.4 pg (ref 27.0–33.0)
MCHC: 34.9 g/dL (ref 32.0–36.0)
MCV: 89.9 fL (ref 80.0–100.0)
MPV: 10.9 fL (ref 7.5–12.5)
Platelets: 257 10*3/uL (ref 140–400)
RBC: 4.97 10*6/uL (ref 4.20–5.80)
RDW: 12.9 % (ref 11.0–15.0)
WBC: 4.4 10*3/uL (ref 3.8–10.8)

## 2022-07-06 LAB — HEMOGLOBIN A1C
Hgb A1c MFr Bld: 5.6 % of total Hgb (ref <5.7)
Mean Plasma Glucose: 114 mg/dL
eAG (mmol/L): 6.3 mmol/L

## 2022-07-06 LAB — PSA, TOTAL AND FREE
PSA, % Free: 40 % (calc) (ref >25)
PSA, Free: 0.2 ng/mL
PSA, Total: 0.5 ng/mL (ref <=4.0)

## 2022-07-06 LAB — TSH: TSH: 3.02 mIU/L (ref 0.40–4.50)

## 2022-07-10 MED ORDER — EZETIMIBE 10 MG PO TABS: 10.0000 mg | ORAL_TABLET | Freq: Every day | ORAL | 3 refills | Status: DC

## 2022-07-10 NOTE — Assessment & Plan Note (Signed)
Lipids still elevated, continue fenofibrate, adding Zetia, statin intolerant. Recheck lipids in 3 months.

## 2022-07-13 ENCOUNTER — Other Ambulatory Visit: Payer: Self-pay | Admitting: Sports Medicine

## 2022-07-15 ENCOUNTER — Other Ambulatory Visit: Payer: Self-pay | Admitting: Sports Medicine

## 2022-08-08 ENCOUNTER — Encounter: Payer: Self-pay | Admitting: Gastroenterology

## 2022-08-15 ENCOUNTER — Other Ambulatory Visit: Payer: Self-pay | Admitting: Sports Medicine

## 2022-08-19 ENCOUNTER — Other Ambulatory Visit: Payer: Self-pay | Admitting: Sports Medicine

## 2022-09-20 ENCOUNTER — Other Ambulatory Visit: Payer: Self-pay | Admitting: Sports Medicine

## 2022-09-21 ENCOUNTER — Encounter: Payer: Self-pay | Admitting: Sports Medicine

## 2022-09-21 DIAGNOSIS — E783 Hyperchylomicronemia: Secondary | ICD-10-CM

## 2022-09-21 MED ORDER — EZETIMIBE 10 MG PO TABS: 10.0000 mg | ORAL_TABLET | Freq: Every day | ORAL | 9 refills | Status: DC

## 2022-09-24 ENCOUNTER — Other Ambulatory Visit: Payer: Self-pay | Admitting: Sports Medicine

## 2022-09-27 ENCOUNTER — Telehealth: Payer: Self-pay

## 2022-09-27 DIAGNOSIS — E783 Hyperchylomicronemia: Secondary | ICD-10-CM

## 2022-09-27 NOTE — Telephone Encounter (Signed)
Lab orders

## 2022-09-28 LAB — LIPID PANEL
Cholesterol: 201 mg/dL — ABNORMAL HIGH (ref <200)
HDL: 38 mg/dL — ABNORMAL LOW (ref >=40)
LDL Cholesterol (Calc): 123 mg/dL (calc) — ABNORMAL HIGH
Non-HDL Cholesterol (Calc): 163 mg/dL (calc) — ABNORMAL HIGH (ref <130)
Total CHOL/HDL Ratio: 5.3 (calc) — ABNORMAL HIGH (ref <5.0)
Triglycerides: 246 mg/dL — ABNORMAL HIGH (ref <150)

## 2022-09-28 NOTE — Telephone Encounter (Signed)
Message forwarded to Dr. Dianah Field.

## 2022-10-02 ENCOUNTER — Other Ambulatory Visit: Payer: Self-pay | Admitting: Sports Medicine

## 2022-10-25 ENCOUNTER — Other Ambulatory Visit: Payer: Self-pay | Admitting: Sports Medicine

## 2022-10-29 ENCOUNTER — Other Ambulatory Visit: Payer: Self-pay | Admitting: Sports Medicine

## 2022-11-20 ENCOUNTER — Other Ambulatory Visit: Payer: Self-pay | Admitting: Sports Medicine

## 2022-11-24 ENCOUNTER — Other Ambulatory Visit: Payer: Self-pay | Admitting: Sports Medicine

## 2022-12-04 ENCOUNTER — Other Ambulatory Visit: Payer: Self-pay | Admitting: Sports Medicine

## 2022-12-04 DIAGNOSIS — N529 Male erectile dysfunction, unspecified: Secondary | ICD-10-CM

## 2022-12-25 ENCOUNTER — Ambulatory Visit (INDEPENDENT_AMBULATORY_CARE_PROVIDER_SITE_OTHER): Payer: PRIVATE HEALTH INSURANCE

## 2022-12-25 ENCOUNTER — Ambulatory Visit (INDEPENDENT_AMBULATORY_CARE_PROVIDER_SITE_OTHER): Payer: PRIVATE HEALTH INSURANCE | Admitting: Sports Medicine

## 2022-12-25 DIAGNOSIS — E6609 Other obesity due to excess calories: Secondary | ICD-10-CM

## 2022-12-25 DIAGNOSIS — K5790 Diverticulosis of intestine, part unspecified, without perforation or abscess without bleeding: Secondary | ICD-10-CM | POA: Diagnosis not present

## 2022-12-25 DIAGNOSIS — M1712 Unilateral primary osteoarthritis, left knee: Secondary | ICD-10-CM | POA: Diagnosis not present

## 2022-12-25 DIAGNOSIS — K5900 Constipation, unspecified: Secondary | ICD-10-CM | POA: Diagnosis not present

## 2022-12-25 DIAGNOSIS — M7542 Impingement syndrome of left shoulder: Secondary | ICD-10-CM

## 2022-12-25 DIAGNOSIS — N529 Male erectile dysfunction, unspecified: Secondary | ICD-10-CM

## 2022-12-25 DIAGNOSIS — E783 Hyperchylomicronemia: Secondary | ICD-10-CM | POA: Diagnosis not present

## 2022-12-25 DIAGNOSIS — M7541 Impingement syndrome of right shoulder: Secondary | ICD-10-CM

## 2022-12-25 MED ORDER — SENNOSIDES-DOCUSATE SODIUM 8.6-50 MG PO TABS: 2.0000 | ORAL_TABLET | Freq: Two times a day (BID) | ORAL | 0 refills | Status: DC

## 2022-12-25 MED ORDER — TADALAFIL 5 MG PO TABS: 5.0000 mg | ORAL_TABLET | Freq: Every day | ORAL | 3 refills | Status: DC

## 2022-12-25 MED ORDER — FENOFIBRATE 160 MG PO TABS: 160.0000 mg | ORAL_TABLET | Freq: Every day | ORAL | 3 refills | Status: DC

## 2022-12-25 MED ORDER — EZETIMIBE 10 MG PO TABS: 10.0000 mg | ORAL_TABLET | Freq: Every day | ORAL | 3 refills | Status: DC

## 2022-12-25 MED ORDER — TOPIRAMATE 50 MG PO TABS: ORAL_TABLET | ORAL | 3 refills | Status: DC

## 2022-12-25 MED ORDER — DICLOFENAC SODIUM 75 MG PO TBEC
75.0000 mg | DELAYED_RELEASE_TABLET | Freq: Two times a day (BID) | ORAL | 3 refills | Status: AC
Start: 2022-12-25 — End: 2023-12-25

## 2022-12-25 NOTE — Assessment & Plan Note (Signed)
Also complaining of constipation with intermittent obstipation, no melena, hematochezia, no nausea, vomiting, incidentally noted diverticulosis on the CT scan from several years ago. He was able to stool and pass gas today. On exam his belly is soft, we will get belly x-rays, I would also like him to do some stool softeners for the time being. If insufficient improvement over a couple of weeks I would like advanced imaging and potentially referral for another colonoscopy.

## 2022-12-25 NOTE — Progress Notes (Signed)
    Procedures performed today:    None.  Independent interpretation of notes and tests performed by another provider:   None.  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  Diverticulosis Also complaining of constipation with intermittent obstipation, no melena, hematochezia, no nausea, vomiting, incidentally noted diverticulosis on the CT scan from several years ago. He was able to stool and pass gas today. On exam his belly is soft, we will get belly x-rays, I would also like him to do some stool softeners for the time being. If insufficient improvement over a couple of weeks I would like advanced imaging and potentially referral for another colonoscopy.  I spent 30 minutes of total time managing this patient today, this includes chart review, face to face, and non-face to face time.  ____________________________________________ Ihor Austin. Benjamin Stain, M.D., ABFM., CAQSM., AME. Primary Care and Sports Medicine [facility]  Adjunct Professor of Family Medicine  [place] [employer] [facility] [employer] Medicine  Restaurant manager, fast food

## 2022-12-25 NOTE — Assessment & Plan Note (Signed)
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

## 2022-12-30 ENCOUNTER — Other Ambulatory Visit: Payer: Self-pay | Admitting: Sports Medicine

## 2023-01-08 ENCOUNTER — Ambulatory Visit: Payer: PRIVATE HEALTH INSURANCE | Admitting: Sports Medicine

## 2023-01-15 ENCOUNTER — Other Ambulatory Visit: Payer: Self-pay | Admitting: Orthopedic Surgery

## 2023-01-15 DIAGNOSIS — G8929 Other chronic pain: Secondary | ICD-10-CM

## 2023-01-15 DIAGNOSIS — M545 Low back pain, unspecified: Secondary | ICD-10-CM

## 2023-02-06 ENCOUNTER — Ambulatory Visit
Admission: RE | Admit: 2023-02-06 | Discharge: 2023-02-06 | Disposition: A | Discharge status: Home / Self Care | Payer: PRIVATE HEALTH INSURANCE | Source: Ambulatory Visit | Attending: Orthopedic Surgery | Admitting: Orthopedic Surgery

## 2023-02-06 DIAGNOSIS — M545 Low back pain, unspecified: Secondary | ICD-10-CM

## 2023-03-13 ENCOUNTER — Other Ambulatory Visit: Payer: Self-pay | Admitting: Orthopedic Surgery

## 2023-03-13 DIAGNOSIS — M545 Low back pain, unspecified: Secondary | ICD-10-CM

## 2023-04-01 ENCOUNTER — Ambulatory Visit
Admission: RE | Admit: 2023-04-01 | Discharge: 2023-04-01 | Disposition: A | Discharge status: Home / Self Care | Payer: PRIVATE HEALTH INSURANCE | Source: Ambulatory Visit | Attending: Orthopedic Surgery | Admitting: Orthopedic Surgery

## 2023-04-01 DIAGNOSIS — M545 Low back pain, unspecified: Secondary | ICD-10-CM

## 2023-04-16 ENCOUNTER — Encounter: Payer: Self-pay | Admitting: Sports Medicine

## 2023-04-16 ENCOUNTER — Ambulatory Visit (INDEPENDENT_AMBULATORY_CARE_PROVIDER_SITE_OTHER): Payer: PRIVATE HEALTH INSURANCE | Admitting: Sports Medicine

## 2023-04-16 ENCOUNTER — Ambulatory Visit: Payer: PRIVATE HEALTH INSURANCE

## 2023-04-16 DIAGNOSIS — Z0189 Encounter for other specified special examinations: Secondary | ICD-10-CM | POA: Diagnosis not present

## 2023-04-16 DIAGNOSIS — H6192 Disorder of left external ear, unspecified: Secondary | ICD-10-CM | POA: Diagnosis not present

## 2023-04-16 DIAGNOSIS — M48062 Spinal stenosis, lumbar region with neurogenic claudication: Secondary | ICD-10-CM | POA: Diagnosis not present

## 2023-04-16 DIAGNOSIS — M1712 Unilateral primary osteoarthritis, left knee: Secondary | ICD-10-CM

## 2023-04-16 NOTE — Assessment & Plan Note (Addendum)
 Status post L2-L5 laminectomy with facetectomy in 2020 with Dr. Beuford, followed by L3-L4 lumbar fusion with Dr. Beuford. They have been working him up for axial back pain, ultimately he has had SI joint injections, as well as a block which seem to work for a few hours, he is scheduled for minimally invasive SI joint fusion. Today he has some questions regarding the procedure, he does have some numbness and tingling anterior thigh as well as axial back pain, I did let him know that the axial back pain was likely related to his SI joint and I would expect more improvement in the axial back pain then the anterior thigh numbness and tingling from the surgery. He is concerned about the anterior thigh numbness and tingling so we will proceed with nerve conduction and EMG with Dr. Cesario. Hopefully this can help guide Kayla and Dr. Beuford.  Update: Dr. Cesario does not do lower extremity nerve conduction and EMG, switching to [facility] neurology.

## 2023-04-16 NOTE — Assessment & Plan Note (Signed)
Known osteoarthritis, had a fall recently, pain anteriorly, overall this is improving considerably, he is knee is stable, no effusion, we will get some x-rays and then watch this for now.

## 2023-04-16 NOTE — Progress Notes (Signed)
    Procedures performed today:    Procedure:  Cryodestruction of left inner auricle suspicious skin lesion 1 cm. Consent obtained and verified. Time-out conducted. Noted no overlying erythema, induration, or other signs of local infection. Completed without difficulty using Cryo-Gun. Advised to call if fevers/chills, erythema, induration, drainage, or persistent bleeding.  Independent interpretation of notes and tests performed by another provider:   None.  Brief History, Exam, Impression, and Recommendations:    Primary osteoarthritis of left knee Known osteoarthritis, had a fall recently, pain anteriorly, overall this is improving considerably, he is knee is stable, no effusion, we will get some x-rays and then watch this for now.  Lumbar spinal stenosis Status post L2-L5 laminectomy with facetectomy in 2020 with Dr. Yevette Edwards, followed by L3-L4 lumbar fusion with Dr. Yevette Edwards. They have been working him up for axial back pain, ultimately he has had SI joint injections, as well as a block which seem to work for a few hours, he is scheduled for minimally invasive SI joint fusion. Today he has some questions regarding the procedure, he does have some numbness and tingling anterior thigh as well as axial back pain, I did let him know that the axial back pain was likely related to his SI joint and I would expect more improvement in the axial back pain then the anterior thigh numbness and tingling from the surgery. He is concerned about the anterior thigh numbness and tingling so we will proceed with nerve conduction and EMG with Dr. Regino Schultze. Hopefully this can help guide Kayla and Dr. Yevette Edwards.  Update: Dr. Regino Schultze does not do lower extremity nerve conduction and EMG, switching to [facility] neurology.  Skin lesion of left ear Crusty lesion inside of the auricle present for several years, potential squamous cell carcinoma, aggressive cryotherapy today, return in 1 month for repeat if  needed.    ____________________________________________ Richard Gross. Richard Gross, M.D., ABFM., CAQSM., AME. Primary Care and Sports Medicine [facility]  Adjunct Professor of Family Medicine  [place] [employer] [facility] [employer] Medicine  Restaurant manager, fast food

## 2023-04-16 NOTE — Assessment & Plan Note (Signed)
Crusty lesion inside of the auricle present for several years, potential squamous cell carcinoma, aggressive cryotherapy today, return in 1 month for repeat if needed.

## 2023-04-18 NOTE — Addendum Note (Signed)
Addended by: Monica Becton on: 04/18/2023 12:21 PM   Modules accepted: Orders

## 2023-04-26 ENCOUNTER — Other Ambulatory Visit: Payer: Self-pay

## 2023-04-26 ENCOUNTER — Encounter: Payer: Self-pay | Admitting: Neurology

## 2023-04-26 DIAGNOSIS — R202 Paresthesia of skin: Secondary | ICD-10-CM

## 2023-05-01 ENCOUNTER — Encounter (INDEPENDENT_AMBULATORY_CARE_PROVIDER_SITE_OTHER): Payer: PRIVATE HEALTH INSURANCE | Admitting: Sports Medicine

## 2023-05-01 DIAGNOSIS — M1712 Unilateral primary osteoarthritis, left knee: Secondary | ICD-10-CM | POA: Diagnosis not present

## 2023-05-01 NOTE — Telephone Encounter (Signed)

## 2023-05-03 ENCOUNTER — Ambulatory Visit (INDEPENDENT_AMBULATORY_CARE_PROVIDER_SITE_OTHER): Payer: PRIVATE HEALTH INSURANCE | Admitting: Neurology

## 2023-05-03 DIAGNOSIS — M5417 Radiculopathy, lumbosacral region: Secondary | ICD-10-CM

## 2023-05-03 DIAGNOSIS — R202 Paresthesia of skin: Secondary | ICD-10-CM | POA: Diagnosis not present

## 2023-05-03 NOTE — Procedures (Signed)
[facility]  [address] [place], [address]  [place], [place] [address] Tel: [phone] Fax: [phone] Test Date:  05/03/2023  Patient: Richard Gross DOB: 1961-04-17 Physician: Nita Sickle, DO  Sex: Male Height: 5\' 8"  Ref Phys: Rodney Langton, MD  ID#: [id number]   Technician:    History: This is a 62 year old man with prior lumbar surgery referred for evaluation of bilateral thigh paresthesias.  NCV & EMG Findings: Extensive electrodiagnostic testing of the right lower extremity and additional studies of the left shows:  Bilateral sural and superficial peroneal sensory responses are within normal limits. Bilateral peroneal and tibial motor responses are within normal limits. Bilateral tibial H reflex studies are within normal limits. Chronic motor axon loss changes are seen affecting all the tested muscles of the lower extremities involving the L3-S1 myotomes, without accompanying active denervation.  Impression: Chronic multilevel radiculopathies affecting the L3, L4, L5, and S1 nerve root/segments bilaterally.  Overall, these findings are moderate in degree electrically. There is no evidence of a large fiber sensorimotor polyneuropathy affecting the lower extremities.   ___________________________ Nita Sickle, DO    Nerve Conduction Studies   Stim Site NR Peak (ms) Norm Peak (ms) O-P Amp (V) Norm O-P Amp  Left Sup Peroneal Anti Sensory (Ant Lat Mall)  32 C  12 cm    2.1 <4.6 5.3 >3  Right Sup Peroneal Anti Sensory (Ant Lat Mall)  32 C  12 cm    2.1 <4.6 5.1 >3  Left Sural Anti Sensory (Lat Mall)  32 C  Calf    2.7 <4.6 7.9 >3  Right Sural Anti Sensory (Lat Mall)  32 C  Calf    2.5 <4.6 8.2 >3     Stim Site NR Onset (ms) Norm Onset (ms) O-P Amp (mV) Norm O-P Amp Site1 Site2 Delta-0 (ms) Dist (cm) Vel (m/s) Norm Vel (m/s)  Left Peroneal Motor (Ext Dig Brev)  32 C  Ankle    4.1 <6.0 2.6 >2.5 B Fib Ankle 8.3 39.0 47 >40  B Fib    12.4  2.2  Poplt  B Fib 1.5 8.0 53 >40  Poplt    13.9  2.1         Right Peroneal Motor (Ext Dig Brev)  32 C  Ankle    3.0 <6.0 2.7 >2.5 B Fib Ankle 8.5 36.0 42 >40  B Fib    11.5  2.3  Poplt B Fib 1.9 10.0 53 >40  Poplt    13.4  2.2         Left Tibial Motor (Abd Hall Brev)  32 C  Ankle    3.8 <6.0 8.2 >4 Knee Ankle 9.3 42.0 45 >40  Knee    13.1  5.5         Right Tibial Motor (Abd Hall Brev)  32 C  Ankle    3.6 <6.0 6.0 >4 Knee Ankle 8.3 43.0 52 >40  Knee    11.9  4.9          Electromyography   Side Muscle Ins.Act Fibs Fasc Recrt Amp Dur Poly Activation Comment  Right AntTibialis Nml Nml Nml *2- *1+ *1+ *1+ Nml N/A  Right Gastroc Nml Nml Nml *2- *1+ *1+ *1+ Nml N/A  Right Flex Dig Long Nml Nml Nml *2- *1+ *1+ *1+ Nml N/A  Right RectFemoris Nml Nml Nml *2- *1+ *1+ *1+ Nml N/A  Right GluteusMed Nml Nml Nml *2- *1+ *1+ *1+ Nml N/A  Right AdductorLong  Nml Nml Nml *2- *1+ *1+ *1+ Nml N/A  Left BicepsFemS Nml Nml Nml *2- *1+ *1+ *1+ Nml N/A  Left AntTibialis Nml Nml Nml *2- *1+ *1+ *1+ Nml N/A  Left Gastroc Nml Nml Nml *2- *1+ *1+ *1+ Nml N/A  Left RectFemoris Nml Nml Nml *2- *1+ *1+ *1+ Nml N/A  Left GluteusMed Nml Nml Nml *2- *1+ *1+ *1+ Nml N/A  Left Flex Dig Long Nml Nml Nml *2- *1+ *1+ *1+ Nml N/A      Waveforms:

## 2023-05-17 ENCOUNTER — Ambulatory Visit: Payer: PRIVATE HEALTH INSURANCE | Admitting: Sports Medicine

## 2023-05-20 ENCOUNTER — Encounter: Payer: Self-pay | Admitting: Sports Medicine

## 2023-05-20 ENCOUNTER — Ambulatory Visit (INDEPENDENT_AMBULATORY_CARE_PROVIDER_SITE_OTHER): Payer: PRIVATE HEALTH INSURANCE | Admitting: Sports Medicine

## 2023-05-20 DIAGNOSIS — H6192 Disorder of left external ear, unspecified: Secondary | ICD-10-CM

## 2023-05-20 DIAGNOSIS — Z23 Encounter for immunization: Secondary | ICD-10-CM | POA: Diagnosis not present

## 2023-05-20 NOTE — Assessment & Plan Note (Signed)
Crusty skin lesion, initially suspected to be squama cell carcinoma, we did aggressive cryotherapy about a month ago, repeated today although it is almost completely resolved at this point, we will keep an eye on it.

## 2023-05-20 NOTE — Progress Notes (Signed)
    Procedures performed today:    Procedure:  Cryodestruction of left inner auricle skin lesion Consent obtained and verified. Time-out conducted. Noted no overlying erythema, induration, or other signs of local infection. Completed without difficulty using Cryo-Gun. Advised to call if fevers/chills, erythema, induration, drainage, or persistent bleeding.  Independent interpretation of notes and tests performed by another provider:   None.  Brief History, Exam, Impression, and Recommendations:    Skin lesion of left ear Crusty skin lesion, initially suspected to be squama cell carcinoma, we did aggressive cryotherapy about a month ago, repeated today although it is almost completely resolved at this point, we will keep an eye on it.  I spent 30 minutes of total time managing this patient today, this includes chart review, face to face, and non-face to face time.  ____________________________________________ Ihor Austin. Benjamin Stain, M.D., ABFM., CAQSM., AME. Primary Care and Sports Medicine [facility]  Adjunct Professor of Family Medicine  [place] [employer] [facility] [employer]  Restaurant manager, fast food

## 2023-05-20 NOTE — Addendum Note (Signed)
Addended by: Carren Rang A on: 05/20/2023 04:21 PM   Modules accepted: Orders

## 2023-05-31 ENCOUNTER — Encounter: Payer: PRIVATE HEALTH INSURANCE | Admitting: Neurology

## 2023-08-09 ENCOUNTER — Encounter: Payer: Self-pay | Admitting: Physical Therapy

## 2023-08-09 ENCOUNTER — Other Ambulatory Visit: Payer: Self-pay

## 2023-08-09 ENCOUNTER — Ambulatory Visit: Payer: PRIVATE HEALTH INSURANCE | Attending: Orthopedic Surgery | Admitting: Physical Therapy

## 2023-08-09 DIAGNOSIS — M5459 Other low back pain: Secondary | ICD-10-CM | POA: Diagnosis present

## 2023-08-09 DIAGNOSIS — R2689 Other abnormalities of gait and mobility: Secondary | ICD-10-CM

## 2023-08-09 NOTE — Therapy (Signed)
OUTPATIENT PHYSICAL THERAPY THORACOLUMBAR EVALUATION   Patient Name: Ridge Lafond MRN: [id number] DOB:[birth year], 63 y.o., male Today's Date: 08/09/2023  END OF SESSION:  PT End of Session - 08/09/23 0934     Visit Number 1    Number of Visits 17    Date for PT Re-Evaluation 10/04/23    PT Start Time 0932    PT Stop Time 1011    PT Time Calculation (min) 39 min    Activity Tolerance Patient tolerated treatment well             Past Medical History:  Diagnosis Date   Complication of anesthesia    History of kidney stones    PONV (postoperative nausea and vomiting)    Past Surgical History:  Procedure Laterality Date   ANTERIOR LAT LUMBAR FUSION Left 08/31/2021   Procedure: LEFT-SIDED LATERAL INTERBODY FUSION DECOMPRESSION LUMBAR 3 - LUMBAR 4 WITH INSTRUMENTATION AND ALLOGRAFT;  Surgeon: Estill Bamberg, MD;  Location: [facility] OR;  Service: Orthopedics;  Laterality: Left;   LUMBAR LAMINECTOMY/DECOMPRESSION MICRODISCECTOMY N/A 12/31/2018   Procedure: LUMBAR 2 - LUMBAR 5 DECOMPRESSION;  Surgeon: Estill Bamberg, MD;  Location: [facility] OR;  Service: Orthopedics;  Laterality: N/A;   TONSILLECTOMY     widsom teeth     Patient Active Problem List   Diagnosis Date Noted   Skin lesion of left ear 04/16/2023   Diverticulosis 12/25/2022   Presbycusis 04/27/2022   Family history of abdominal aortic aneurysm 01/18/2021   Haglund's deformity of both heels 02/19/2019   Mass of leg, left 11/15/2017   Obstructive uropathy 05/10/2017   Kidney stone on left side 12/31/2016   Impingement syndrome of both shoulders 07/17/2016   Primary osteoarthritis of left knee 06/19/2016   Bell's palsy 12/01/2012   Obesity 12/01/2012   Hyperlipidemia 03/24/2012   Cervical radiculopathy 02/18/2012   Lumbar spinal stenosis 02/18/2012   Erectile dysfunction 02/18/2012   Hypogonadism male 02/18/2012   Annual physical exam 02/18/2012    PCP: Monica Becton, MD   REFERRING PROVIDER: Estill Bamberg,  MD   REFERRING DIAG: S/p R SIJ fusion  Rationale for Evaluation and Treatment: Rehabilitation  THERAPY DIAG:  Other low back pain  Other abnormalities of gait and mobility  ONSET DATE: 06/21/23 R SIJ fusion  SUBJECTIVE:                                                                                                                                                                                           SUBJECTIVE STATEMENT: Pt states he was having stabbing pain in R buttocks, low back, and anterior thigh. Thigh pain and buttock pain has  essentially resolved per pt. States he is still having some central low back pain. Able to perform most self care tasks but has particular difficulty w/ lower body dressing. He notes walking/standing tolerance has improved compared to before surgery but remains limited. Also has difficulty with prolonged sitting. Denies any bowel/bladder changes, no saddle anesthesia. No N/T, does endorse a couple episodes of RLE foot drop (2-3) since surgery but not consistent or worsening, states he has communicated this with surgical team and no concerns. States their follow up last week went well.  Had WB restrictions initially, using crutches. Has since weaned to full WB no AD. On light duty restrictions at work. Pt states he was initially advised to avoid BLT, unsure if these precautions are still in place but has been limiting.    PERTINENT HISTORY:  Prior back surgeries  Post op timeline (DOS 06/21/23) 6 weeks: 08/02/23 8 weeks: 08/16/23 10 weeks: 08/30/23 12 weeks: 09/13/23  PAIN:  Are you having pain: 2-3/10 Location/description: central low back Best-worst over past week: 0-5/10  - aggravating factors: lower body dressing, lying down for prolonged periods, walking longer distance (using cart in grocery store), carrying device for work (around neck) - Easing factors: rest    PRECAUTIONS: s/p R SIJ fusion 06/21/23  WEIGHT BEARING RESTRICTIONS: WBAT  FALLS:   Has patient fallen in last 6 months? 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   PATIENT GOALS: get back to helping with go cart, get back to golfing, get more flexible, be able to be active   NEXT MD VISIT: March 2025  OBJECTIVE:  Note: Objective measures were completed at Evaluation unless otherwise noted.  DIAGNOSTIC FINDINGS:  S/p R SIJ fusion 06/21/23  PATIENT SURVEYS:  FOTO deferred on first visit - not yet set up in system  COGNITION: Overall cognitive status: Within functional limits for tasks assessed     SENSATION: Light touch intact BIL LE   POSTURE: increased trunk flexion in sitting and standing, mild lateral shift to L    LUMBAR ROM:   AROM eval  Flexion   Extension   Right lateral flexion   Left lateral flexion   Right rotation   Left rotation    (Blank rows = not tested) Comments: deferred on eval given proximity to surgery  LOWER EXTREMITY ROM:     Active  Right eval Left eval  Hip flexion 85 deg (standing, with slight pull in anterior hip) 90 deg  Hip extension    Hip abduction    Hip adduction    Hip internal rotation    Hip external rotation    Knee flexion    Knee extension    Ankle dorsiflexion    Ankle plantarflexion    Ankle inversion    Ankle eversion     (Blank rows = not tested)  LOWER EXTREMITY MMT:    MMT Right eval Left eval  Hip flexion    Hip extension    Hip abduction    Hip adduction    Hip internal rotation    Hip external rotation    Knee flexion    Knee extension  Ankle dorsiflexion    Ankle plantarflexion    Ankle inversion    Ankle eversion     (Blank rows = not  tested) Comments: focal MMT deferred given proximity to surgery  LUMBAR SPECIAL TESTS:  Deferred given surgery  FUNCTIONAL TESTS:  5xSTS: 15.03sec no UE support, mild pull  TUG: 9.29sec TUG Staircase assessment: no rails, good mechanics, reciprocal pattern. Mildly reduced stance time on RLE compared to L  GAIT: Distance walked: within clinic  Assistive device utilized: None Level of assistance: Complete Independence Comments: antalgic gait on RLE, reduced hip extension/flexion on RLE, reduced truncal rotation/arm swing R more so than L   TREATMENT DATE:  [facility] Adult PT Treatment:                                                DATE: 08/09/23 Therapeutic Exercise: Heel raises x10 cues for appropriate posture and symmetrical WB Standing march x5 BIL cues for UE support, comfortable ROM STS practice, cues for home setup and pacing HEP handout + education, rationale for interventions, relevant anatomy/physiology                                                                                                                            PATIENT EDUCATION:  Education details: Pt education on PT impairments, prognosis, and POC. Informed consent. Rationale for interventions, safe/appropriate HEP performance Person educated: Patient Education method: Explanation, Demonstration, Tactile cues, Verbal cues Education comprehension: verbalized understanding, returned demonstration, verbal cues required, tactile cues required, and needs further education    HOME EXERCISE PROGRAM: Access Code: [email or web address] URL: [email or web address] Date: 08/09/2023 Prepared by: Fransisco Hertz  Exercises - Sit to Stand with Armchair  - 2-3 x daily - 1 sets - 8 reps - Heel Raises with Counter Support  - 2-3 x daily - 1 sets - 10 reps - Standing March with Counter Support  - 2-3 x daily - 1 sets - 5 reps  ASSESSMENT:  CLINICAL IMPRESSION: Patient is a pleasant 63 y.o. gentleman who was seen today for  physical therapy evaluation and treatment for back pain s/p R SIJ fusion 06/21/23. Pt ~7 weeks post op and states he is doing well overall, most difficulty with prolonged sitting/standing, lower body dressing. Focal MMT/ROM exam deferred today given proximity to surgery and pt reporting he may still be on BLT precautions - 5xSTS time is indicative of fall risk and reduced functional mobility, also demonstrates reduced WB through RLE as expected post op. TUG time WNL but altered mechanics w/ gait as above. Does well overall with stair assessment, mildly impaired kinematics. Mechanics w/ gait/transfers are indicative of reduced hip/core strength and mobility. Pt tolerates HEP/exam well without any increase in pain, no adverse events. Recommend skilled PT to address aforementioned deficits with aim of improving functional tolerance and reducing pain with typical  activities. Pt departs today's session in no acute distress, all voiced concerns/questions addressed appropriately from PT perspective.    OBJECTIVE IMPAIRMENTS: Abnormal gait, decreased activity tolerance, decreased endurance, decreased mobility, difficulty walking, decreased ROM, decreased strength, improper body mechanics, postural dysfunction, and pain.   ACTIVITY LIMITATIONS: carrying, lifting, bending, sitting, standing, stairs, transfers, and locomotion level  PARTICIPATION LIMITATIONS: meal prep, cleaning, laundry, community activity, and occupation  PERSONAL FACTORS: Time since onset of injury/illness/exacerbation and 1 comorbidity: surgical history  are also affecting patient's functional outcome.   REHAB POTENTIAL: Good  CLINICAL DECISION MAKING: Stable/uncomplicated  EVALUATION COMPLEXITY: Low   GOALS:   SHORT TERM GOALS: Target date: 09/06/2023 Pt will demonstrate appropriate understanding and performance of initially prescribed HEP in order to facilitate improved independence with management of symptoms.  Baseline: HEP provided  on eval Goal status: INITIAL   2. Pt will improve at least MCID on FOTO in order to demonstrate improved perception of function due to symptoms.  Baseline: FOTO TBD  Goal status: INITIAL    LONG TERM GOALS: Target date: 10/04/2023     Pt will meet predicted score on FOTO in order to demonstrate improved perception of functional status due to symptoms.  Baseline: FOTO TBD Goal status: INITIAL  2.  Pt will demonstrate at least 75% normal lumbar rotation AROM in order to demonstrate improved tolerance to functional movement patterns.  Baseline: deferred on eval given proximity to surgery Goal status: INITIAL  3.  Pt will demonstrate hip MMT of at least 4+/5 bilat in order to demonstrate improved strength for functional movements.  Baseline: deferred on eval given proximity to surgery Goal status: INITIAL  4. Pt will perform 5xSTS in <10 sec in order to demonstrate reduced fall risk and improved functional independence. (MCID of 2.3sec)  Baseline: 15sec  Goal status: INITIAL   5. Pt will report/demonstrate ability to stand/walk at least 20 min with less than 2 pt increase on NPS or altered mechanics from baseline in order to facilitate improved tolerance to job tasks and community navigation.  Baseline: reports limited ambulation, using shopping cart w/ grocery store  Goal status: INITIAL   PLAN:  PT FREQUENCY: 2x/week  PT DURATION: 8 weeks  PLANNED INTERVENTIONS: 97164- PT Re-evaluation, 97110-Therapeutic exercises, 97530- Therapeutic activity, 97112- Neuromuscular re-education, 97535- Self Care, 54098- Manual therapy, [phone]- Gait training, Patient/Family education, Balance training, Stair training, Taping, Dry Needling, Joint mobilization, Spinal mobilization, Scar mobilization, Cryotherapy, and Moist heat.  PLAN FOR NEXT SESSION: Review/update HEP PRN. Initial emphasis on functional mobility, mechanics, symmetrical WB. Symptom modification strategies as indicated/appropriate.     [name] Murrain PT, DPT 08/09/2023 12:08 PM

## 2023-08-13 ENCOUNTER — Ambulatory Visit: Payer: PRIVATE HEALTH INSURANCE | Admitting: Physical Therapy

## 2023-08-13 ENCOUNTER — Encounter: Payer: Self-pay | Admitting: Physical Therapy

## 2023-08-13 DIAGNOSIS — R2689 Other abnormalities of gait and mobility: Secondary | ICD-10-CM

## 2023-08-13 DIAGNOSIS — M5459 Other low back pain: Secondary | ICD-10-CM

## 2023-08-13 NOTE — Therapy (Signed)
OUTPATIENT PHYSICAL THERAPY THORACOLUMBAR TREATMENT   Patient Name: Richard Gross MRN: [id number] DOB:1961/06/07, 63 y.o., male Today's Date: 08/13/2023  END OF SESSION:  PT End of Session - 08/13/23 0921     Visit Number 2    Number of Visits 17    Date for PT Re-Evaluation 10/04/23    PT Start Time 0843    PT Stop Time 0921    PT Time Calculation (min) 38 min    Activity Tolerance Patient tolerated treatment well    Behavior During Therapy [facility] for tasks assessed/performed              Past Medical History:  Diagnosis Date   Complication of anesthesia    History of kidney stones    PONV (postoperative nausea and vomiting)    Past Surgical History:  Procedure Laterality Date   ANTERIOR LAT LUMBAR FUSION Left 08/31/2021   Procedure: LEFT-SIDED LATERAL INTERBODY FUSION DECOMPRESSION LUMBAR 3 - LUMBAR 4 WITH INSTRUMENTATION AND ALLOGRAFT;  Surgeon: Estill Bamberg, MD;  Location: [facility] OR;  Service: Orthopedics;  Laterality: Left;   LUMBAR LAMINECTOMY/DECOMPRESSION MICRODISCECTOMY N/A 12/31/2018   Procedure: LUMBAR 2 - LUMBAR 5 DECOMPRESSION;  Surgeon: Estill Bamberg, MD;  Location: [facility] OR;  Service: Orthopedics;  Laterality: N/A;   TONSILLECTOMY     widsom teeth     Patient Active Problem List   Diagnosis Date Noted   Skin lesion of left ear 04/16/2023   Diverticulosis 12/25/2022   Presbycusis 04/27/2022   Family history of abdominal aortic aneurysm 01/18/2021   Haglund's deformity of both heels 02/19/2019   Mass of leg, left 11/15/2017   Obstructive uropathy 05/10/2017   Kidney stone on left side 12/31/2016   Impingement syndrome of both shoulders 07/17/2016   Primary osteoarthritis of left knee 06/19/2016   Bell's palsy 12/01/2012   Obesity 12/01/2012   Hyperlipidemia 03/24/2012   Cervical radiculopathy 02/18/2012   Lumbar spinal stenosis 02/18/2012   Erectile dysfunction 02/18/2012   Hypogonadism male 02/18/2012   Annual physical exam 02/18/2012    PCP:  Monica Becton, MD   REFERRING PROVIDER: Estill Bamberg, MD   REFERRING DIAG: S/p R SIJ fusion  Rationale for Evaluation and Treatment: Rehabilitation  THERAPY DIAG:  Other low back pain  Other abnormalities of gait and mobility  ONSET DATE: 06/21/23 R SIJ fusion  SUBJECTIVE:                                                                                                                                                                                           SUBJECTIVE STATEMENT: Pt states he has no pain this morning. He did  have "cramps" during the night but is feeling better now   PERTINENT HISTORY:  Prior back surgeries  Pt states he was having stabbing pain in R buttocks, low back, and anterior thigh. Thigh pain and buttock pain has essentially resolved per pt. States he is still having some central low back pain. Able to perform most self care tasks but has particular difficulty w/ lower body dressing. He notes walking/standing tolerance has improved compared to before surgery but remains limited. Also has difficulty with prolonged sitting. Denies any bowel/bladder changes, no saddle anesthesia. No N/T, does endorse a couple episodes of RLE foot drop (2-3) since surgery but not consistent or worsening, states he has communicated this with surgical team and no concerns. States their follow up last week went well.  Had WB restrictions initially, using crutches. Has since weaned to full WB no AD. On light duty restrictions at work. Pt states he was initially advised to avoid BLT, unsure if these precautions are still in place but has been limiting.   Post op timeline (DOS 06/21/23) 6 weeks: 08/02/23 8 weeks: 08/16/23 10 weeks: 08/30/23 12 weeks: 09/13/23  PAIN:  Are you having pain: 0/10 Location/description: central low back Best-worst over past week: 0-5/10  - aggravating factors: lower body dressing, lying down for prolonged periods, walking longer distance (using cart in  grocery store), carrying device for work (around neck) - Easing factors: rest    PRECAUTIONS: s/p R SIJ fusion 06/21/23  WEIGHT BEARING RESTRICTIONS: WBAT  FALLS:  Has patient fallen in last 6 months? 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   PATIENT GOALS: get back to helping with go cart, get back to golfing, get more flexible, be able to be active   NEXT MD VISIT: March 2025  OBJECTIVE:  Note: Objective measures were completed at Evaluation unless otherwise noted.  DIAGNOSTIC FINDINGS:  S/p R SIJ fusion 06/21/23    SENSATION: Light touch intact BIL LE   POSTURE: increased trunk flexion in sitting and standing, mild lateral shift to L    LUMBAR ROM:   AROM eval  Flexion   Extension   Right lateral flexion   Left lateral flexion   Right rotation   Left rotation    (Blank rows = not tested) Comments: deferred on eval given proximity to surgery  LOWER EXTREMITY ROM:     Active  Right eval Left eval  Hip flexion 85 deg (standing, with slight pull in anterior hip) 90 deg  Hip extension    Hip abduction    Hip adduction    Hip internal rotation    Hip external rotation    Knee flexion    Knee extension    Ankle dorsiflexion    Ankle plantarflexion    Ankle inversion    Ankle eversion     (Blank rows = not tested)  LOWER EXTREMITY MMT:    MMT Right eval Left eval  Hip flexion    Hip extension    Hip abduction    Hip adduction    Hip internal rotation    Hip external rotation    Knee flexion  Knee extension    Ankle dorsiflexion    Ankle plantarflexion    Ankle inversion    Ankle eversion     (Blank rows = not  tested) Comments: focal MMT deferred given proximity to surgery  LUMBAR SPECIAL TESTS:  Deferred given surgery  FUNCTIONAL TESTS:  5xSTS: 15.03sec no UE support, mild pull  TUG: 9.29sec TUG Staircase assessment: no rails, good mechanics, reciprocal pattern. Mildly reduced stance time on RLE compared to L  GAIT: Distance walked: within clinic  Assistive device utilized: None Level of assistance: Complete Independence Comments: antalgic gait on RLE, reduced hip extension/flexion on RLE, reduced truncal rotation/arm swing R more so than L   TREATMENT DATE:  [facility] Adult PT Treatment:                                                DATE: 08/13/23 Therapeutic Exercise: Nustep L6 x 5 min for warm up Heel raises 2 x 10 Rows green TB 2 x 10 Shoulder ext green TB 2 x 10 Pallof green TB x 10 bilat STS x 10 --> x 10 with 5# weight Seated ab isometric with physioball 10 x 2 sec hold Seated oblique isometric physioball 10 x 2 sec hold bilat Resisted walking 10# fwd/bkwd x 10, laterally x 5 bilat Slow march 2 x 10  Therapeutic Activity: Review of HEP, relevant anatomy and rationale for treatment   [facility] Adult PT Treatment:                                                DATE: 08/09/23 Therapeutic Exercise: Heel raises x10 cues for appropriate posture and symmetrical WB Standing march x5 BIL cues for UE support, comfortable ROM STS practice, cues for home setup and pacing HEP handout + education, rationale for interventions, relevant anatomy/physiology                                                                                                                            PATIENT EDUCATION:  Education details: Pt education on PT impairments, prognosis, and POC. Informed consent. Rationale for interventions, safe/appropriate HEP performance Person educated: Patient Education method: Explanation, Demonstration, Tactile cues, Verbal cues Education comprehension: verbalized understanding,  returned demonstration, verbal cues required, tactile cues required, and needs further education    HOME EXERCISE PROGRAM: Access Code: [email or web address] URL: [email or web address] Date: 08/13/2023 Prepared by: Reggy Eye  Exercises - Sit to Stand with Armchair  - 2-3 x daily - 1 sets - 8 reps - Heel Raises with Counter Support  - 2-3 x daily - 1 sets - 10 reps - Standing March with Counter Support  - 2-3 x daily - 1 sets - 5 reps -  Standing Anti-Rotation Press with Anchored Resistance  - 1 x daily - 7 x weekly - 3 sets - 10 reps - Shoulder extension with resistance - Neutral  - 1 x daily - 7 x weekly - 3 sets - 10 reps  ASSESSMENT:  CLINICAL IMPRESSION: Pt with good tolerance to progression of core strengthening. Updated HEP to increase core strength. Pt challenged by lateral resisted walking and may benefit from continued balance training   GOALS:   SHORT TERM GOALS: Target date: 09/06/2023 Pt will demonstrate appropriate understanding and performance of initially prescribed HEP in order to facilitate improved independence with management of symptoms.  Baseline: HEP provided on eval Goal status: INITIAL   2. Pt will improve at least MCID on FOTO in order to demonstrate improved perception of function due to symptoms.  Baseline: FOTO TBD  Goal status: INITIAL    LONG TERM GOALS: Target date: 10/04/2023     Pt will meet predicted score on FOTO in order to demonstrate improved perception of functional status due to symptoms.  Baseline: FOTO TBD Goal status: INITIAL  2.  Pt will demonstrate at least 75% normal lumbar rotation AROM in order to demonstrate improved tolerance to functional movement patterns.  Baseline: deferred on eval given proximity to surgery Goal status: INITIAL  3.  Pt will demonstrate hip MMT of at least 4+/5 bilat in order to demonstrate improved strength for functional movements.  Baseline: deferred on eval given proximity to surgery Goal  status: INITIAL  4. Pt will perform 5xSTS in <10 sec in order to demonstrate reduced fall risk and improved functional independence. (MCID of 2.3sec)  Baseline: 15sec  Goal status: INITIAL   5. Pt will report/demonstrate ability to stand/walk at least 20 min with less than 2 pt increase on NPS or altered mechanics from baseline in order to facilitate improved tolerance to job tasks and community navigation.  Baseline: reports limited ambulation, using shopping cart w/ grocery store  Goal status: INITIAL   PLAN:  PT FREQUENCY: 2x/week  PT DURATION: 8 weeks  PLANNED INTERVENTIONS: 97164- PT Re-evaluation, 97110-Therapeutic exercises, 97530- Therapeutic activity, 97112- Neuromuscular re-education, 97535- Self Care, 16109- Manual therapy, [phone]- Gait training, Patient/Family education, Balance training, Stair training, Taping, Dry Needling, Joint mobilization, Spinal mobilization, Scar mobilization, Cryotherapy, and Moist heat.  PLAN FOR NEXT SESSION: Review/update HEP PRN. Initial emphasis on functional mobility, mechanics, symmetrical WB. Symptom modification strategies as indicated/appropriate.   Reggy Eye, PT,DPT01/28/259:21 AM

## 2023-08-14 NOTE — Therapy (Signed)
OUTPATIENT PHYSICAL THERAPY THORACOLUMBAR TREATMENT   Patient Name: Richard Gross MRN: [id number] DOB:31-Jul-1960, 63 y.o., male Today's Date: 08/15/2023  END OF SESSION:  PT End of Session - 08/15/23 0755     Visit Number 3    Number of Visits 17    Date for PT Re-Evaluation 10/04/23    PT Start Time 0756    PT Stop Time 0838    PT Time Calculation (min) 42 min    Activity Tolerance Patient tolerated treatment well               Past Medical History:  Diagnosis Date   Complication of anesthesia    History of kidney stones    PONV (postoperative nausea and vomiting)    Past Surgical History:  Procedure Laterality Date   ANTERIOR LAT LUMBAR FUSION Left 08/31/2021   Procedure: LEFT-SIDED LATERAL INTERBODY FUSION DECOMPRESSION LUMBAR 3 - LUMBAR 4 WITH INSTRUMENTATION AND ALLOGRAFT;  Surgeon: Estill Bamberg, MD;  Location: [facility] OR;  Service: Orthopedics;  Laterality: Left;   LUMBAR LAMINECTOMY/DECOMPRESSION MICRODISCECTOMY N/A 12/31/2018   Procedure: LUMBAR 2 - LUMBAR 5 DECOMPRESSION;  Surgeon: Estill Bamberg, MD;  Location: [facility] OR;  Service: Orthopedics;  Laterality: N/A;   TONSILLECTOMY     widsom teeth     Patient Active Problem List   Diagnosis Date Noted   Skin lesion of left ear 04/16/2023   Diverticulosis 12/25/2022   Presbycusis 04/27/2022   Family history of abdominal aortic aneurysm 01/18/2021   Haglund's deformity of both heels 02/19/2019   Mass of leg, left 11/15/2017   Obstructive uropathy 05/10/2017   Kidney stone on left side 12/31/2016   Impingement syndrome of both shoulders 07/17/2016   Primary osteoarthritis of left knee 06/19/2016   Bell's palsy 12/01/2012   Obesity 12/01/2012   Hyperlipidemia 03/24/2012   Cervical radiculopathy 02/18/2012   Lumbar spinal stenosis 02/18/2012   Erectile dysfunction 02/18/2012   Hypogonadism male 02/18/2012   Annual physical exam 02/18/2012    PCP: Monica Becton, MD   REFERRING PROVIDER: Estill Bamberg, MD   REFERRING DIAG: S/p R SIJ fusion  Rationale for Evaluation and Treatment: Rehabilitation  THERAPY DIAG:  Other low back pain  Other abnormalities of gait and mobility  ONSET DATE: 06/21/23 R SIJ fusion  SUBJECTIVE:                                                                                                                                                                                           SUBJECTIVE STATEMENT: 08/15/2023 no pain at present, did okay after last session. 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 Otherwise no new updates   PERTINENT HISTORY:  Prior back surgeries  Per eval - Pt states he was having stabbing pain in R buttocks, low back, and anterior thigh. Thigh pain and buttock pain has essentially resolved per pt. States he is still having some central low back pain. Able to perform most self care tasks but has particular difficulty w/ lower body dressing. He notes walking/standing tolerance has improved compared to before surgery but remains limited. Also has difficulty with prolonged sitting. Denies any bowel/bladder changes, no saddle anesthesia. No N/T, does endorse a couple episodes of RLE foot drop (2-3) since surgery but not consistent or worsening, states he has communicated this with surgical team and no concerns. States their follow up last week went well.  Had WB restrictions initially, using crutches. Has since weaned to full WB no AD. On light duty restrictions at work. Pt states he was initially advised to avoid BLT, unsure if these precautions are still in place but has been limiting.   Post op timeline (DOS 06/21/23) 6 weeks: 08/02/23 8 weeks: 08/16/23 10 weeks: 08/30/23 12 weeks: 09/13/23  PAIN:  Are you having pain: none  Per eval -  Location/description: central low back Best-worst over past week: 0-5/10  - aggravating factors: lower body dressing, lying down for prolonged periods, walking  longer distance (using cart in grocery store), carrying device for work (around neck) - Easing factors: rest    PRECAUTIONS: s/p R SIJ fusion 06/21/23  WEIGHT BEARING RESTRICTIONS: WBAT  FALLS:  Has patient fallen in last 6 months? 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   PATIENT GOALS: get back to helping with go cart, get back to golfing, get more flexible, be able to be active   NEXT MD VISIT: March 2025  OBJECTIVE:  Note: Objective measures were completed at Evaluation unless otherwise noted.  DIAGNOSTIC FINDINGS:  S/p R SIJ fusion 06/21/23    SENSATION: Light touch intact BIL LE   POSTURE: increased trunk flexion in sitting and standing, mild lateral shift to L    LUMBAR ROM:   AROM eval  Flexion   Extension   Right lateral flexion   Left lateral flexion   Right rotation   Left rotation    (Blank rows = not tested) Comments: deferred on eval given proximity to surgery  LOWER EXTREMITY ROM:     Active  Right eval Left eval  Hip flexion 85 deg (standing, with slight pull in anterior hip) 90 deg  Hip extension    Hip abduction    Hip adduction    Hip internal rotation    Hip external rotation    Knee flexion    Knee extension    Ankle dorsiflexion    Ankle plantarflexion    Ankle inversion    Ankle eversion     (Blank rows = not tested)  LOWER EXTREMITY MMT:    MMT Right eval Left eval  Hip flexion    Hip extension    Hip abduction    Hip adduction    Hip internal rotation    Hip external  rotation    Knee flexion    Knee extension    Ankle dorsiflexion    Ankle plantarflexion    Ankle inversion    Ankle eversion      (Blank rows = not tested) Comments: focal MMT deferred given proximity to surgery  LUMBAR SPECIAL TESTS:  Deferred given surgery  FUNCTIONAL TESTS:  5xSTS: 15.03sec no UE support, mild pull  TUG: 9.29sec TUG Staircase assessment: no rails, good mechanics, reciprocal pattern. Mildly reduced stance time on RLE compared to L  GAIT: Distance walked: within clinic  Assistive device utilized: None Level of assistance: Complete Independence Comments: antalgic gait on RLE, reduced hip extension/flexion on RLE, reduced truncal rotation/arm swing R more so than L   TREATMENT DATE:  [facility] Adult PT Treatment:                                                DATE: 08/15/23 Therapeutic Exercise: Nu step 6 min during subjective LE/UE; seat at 7, L5, arms at 8 Standing heel raises 2x12 Green band row 2x12 cues for pacing and setup Green band shoulder extension 2x12  Neuromuscular re-ed: Yoga block / namastegg step overs, 2 of each, RLE leading, cues for soft landing, stance limb stability, pacing, 2x3 laps  Airex step ups BIL 2x12 w/ UE support cues for quad activation, single limb stability, posture Retro walking (step to) along counter 2 laps cues for posture, step length Retro walking (step through) 2x2 laps     [facility] Adult PT Treatment:                                                DATE: 08/13/23 Therapeutic Exercise: Nustep L6 x 5 min for warm up Heel raises 2 x 10 Rows green TB 2 x 10 Shoulder ext green TB 2 x 10 Pallof green TB x 10 bilat STS x 10 --> x 10 with 5# weight Seated ab isometric with physioball 10 x 2 sec hold Seated oblique isometric physioball 10 x 2 sec hold bilat Resisted walking 10# fwd/bkwd x 10, laterally x 5 bilat Slow march 2 x 10  Therapeutic Activity: Review of HEP, relevant anatomy and rationale for treatment   [facility] Adult PT Treatment:                                                DATE: 08/09/23 Therapeutic Exercise: Heel raises x10 cues for  appropriate posture and symmetrical WB Standing march x5 BIL cues for UE support, comfortable ROM STS practice, cues for home setup and pacing HEP handout + education, rationale for interventions, relevant anatomy/physiology  PATIENT EDUCATION:  Education details: rationale for interventions, HEP  Person educated: Patient Education method: Explanation, Demonstration, Tactile cues, Verbal cues Education comprehension: verbalized understanding, returned demonstration, verbal cues required, tactile cues required, and needs further education     HOME EXERCISE PROGRAM: Access Code: [email or web address] URL: [email or web address] Date: 08/13/2023 Prepared by: Reggy Eye  Exercises - Sit to Stand with Armchair  - 2-3 x daily - 1 sets - 8 reps - Heel Raises with Counter Support  - 2-3 x daily - 1 sets - 10 reps - Standing March with Counter Support  - 2-3 x daily - 1 sets - 5 reps - Standing Anti-Rotation Press with Anchored Resistance  - 1 x daily - 7 x weekly - 3 sets - 10 reps - Shoulder extension with resistance - Neutral  - 1 x daily - 7 x weekly - 3 sets - 10 reps  ASSESSMENT:  CLINICAL IMPRESSION: Pt arrives w/o pain, reports good response to last session. Today with progressions for volume with familiar exercises, working more on environmental navigation and gait kinematics. Tolerates well without increase in pain or adverse events, cues as above. Does report muscular fatigue/tightness as expected on departure. Recommend continuing along current POC in order to address relevant deficits and improve functional tolerance. Pt departs today's session in no acute distress, all voiced questions/concerns addressed appropriately from PT perspective.     GOALS:   SHORT TERM GOALS: Target date: 09/06/2023 Pt will demonstrate appropriate understanding and  performance of initially prescribed HEP in order to facilitate improved independence with management of symptoms.  Baseline: HEP provided on eval Goal status: INITIAL   2. Pt will improve at least MCID on FOTO in order to demonstrate improved perception of function due to symptoms.  Baseline: FOTO TBD  Goal status: INITIAL    LONG TERM GOALS: Target date: 10/04/2023     Pt will meet predicted score on FOTO in order to demonstrate improved perception of functional status due to symptoms.  Baseline: FOTO TBD Goal status: INITIAL  2.  Pt will demonstrate at least 75% normal lumbar rotation AROM in order to demonstrate improved tolerance to functional movement patterns.  Baseline: deferred on eval given proximity to surgery Goal status: INITIAL  3.  Pt will demonstrate hip MMT of at least 4+/5 bilat in order to demonstrate improved strength for functional movements.  Baseline: deferred on eval given proximity to surgery Goal status: INITIAL  4. Pt will perform 5xSTS in <10 sec in order to demonstrate reduced fall risk and improved functional independence. (MCID of 2.3sec)  Baseline: 15sec  Goal status: INITIAL   5. Pt will report/demonstrate ability to stand/walk at least 20 min with less than 2 pt increase on NPS or altered mechanics from baseline in order to facilitate improved tolerance to job tasks and community navigation.  Baseline: reports limited ambulation, using shopping cart w/ grocery store  Goal status: INITIAL   PLAN:  PT FREQUENCY: 2x/week  PT DURATION: 8 weeks  PLANNED INTERVENTIONS: 97164- PT Re-evaluation, 97110-Therapeutic exercises, 97530- Therapeutic activity, 97112- Neuromuscular re-education, 97535- Self Care, 29528- Manual therapy, [phone]- Gait training, Patient/Family education, Balance training, Stair training, Taping, Dry Needling, Joint mobilization, Spinal mobilization, Scar mobilization, Cryotherapy, and Moist heat.  PLAN FOR NEXT SESSION: Review/update  HEP PRN. Initial emphasis on functional mobility, mechanics, symmetrical WB. Symptom modification strategies as indicated/appropriate.   [name] Murrain PT, DPT 08/15/2023 8:42 AM

## 2023-08-15 ENCOUNTER — Encounter: Payer: Self-pay | Admitting: Physical Therapy

## 2023-08-15 ENCOUNTER — Ambulatory Visit: Payer: PRIVATE HEALTH INSURANCE | Admitting: Physical Therapy

## 2023-08-15 DIAGNOSIS — R2689 Other abnormalities of gait and mobility: Secondary | ICD-10-CM

## 2023-08-15 DIAGNOSIS — M5459 Other low back pain: Secondary | ICD-10-CM

## 2023-08-19 ENCOUNTER — Ambulatory Visit: Payer: PRIVATE HEALTH INSURANCE | Attending: Orthopedic Surgery | Admitting: Physical Therapy

## 2023-08-19 ENCOUNTER — Encounter: Payer: Self-pay | Admitting: Physical Therapy

## 2023-08-19 DIAGNOSIS — R2689 Other abnormalities of gait and mobility: Secondary | ICD-10-CM | POA: Insufficient documentation

## 2023-08-19 DIAGNOSIS — M5459 Other low back pain: Secondary | ICD-10-CM | POA: Diagnosis present

## 2023-08-19 NOTE — Therapy (Signed)
OUTPATIENT PHYSICAL THERAPY THORACOLUMBAR TREATMENT   Patient Name: Richard Gross MRN: [id number] DOB:Apr 17, 1961, 63 y.o., male Today's Date: 08/19/2023  END OF SESSION:  PT End of Session - 08/19/23 0710     Visit Number 4    Number of Visits 17    Date for PT Re-Evaluation 10/04/23    PT Start Time 0710    PT Stop Time 0752    PT Time Calculation (min) 42 min    Activity Tolerance Patient tolerated treatment well                Past Medical History:  Diagnosis Date   Complication of anesthesia    History of kidney stones    PONV (postoperative nausea and vomiting)    Past Surgical History:  Procedure Laterality Date   ANTERIOR LAT LUMBAR FUSION Left 08/31/2021   Procedure: LEFT-SIDED LATERAL INTERBODY FUSION DECOMPRESSION LUMBAR 3 - LUMBAR 4 WITH INSTRUMENTATION AND ALLOGRAFT;  Surgeon: Estill Bamberg, MD;  Location: [facility] OR;  Service: Orthopedics;  Laterality: Left;   LUMBAR LAMINECTOMY/DECOMPRESSION MICRODISCECTOMY N/A 12/31/2018   Procedure: LUMBAR 2 - LUMBAR 5 DECOMPRESSION;  Surgeon: Estill Bamberg, MD;  Location: [facility] OR;  Service: Orthopedics;  Laterality: N/A;   TONSILLECTOMY     widsom teeth     Patient Active Problem List   Diagnosis Date Noted   Skin lesion of left ear 04/16/2023   Diverticulosis 12/25/2022   Presbycusis 04/27/2022   Family history of abdominal aortic aneurysm 01/18/2021   Haglund's deformity of both heels 02/19/2019   Mass of leg, left 11/15/2017   Obstructive uropathy 05/10/2017   Kidney stone on left side 12/31/2016   Impingement syndrome of both shoulders 07/17/2016   Primary osteoarthritis of left knee 06/19/2016   Bell's palsy 12/01/2012   Obesity 12/01/2012   Hyperlipidemia 03/24/2012   Cervical radiculopathy 02/18/2012   Lumbar spinal stenosis 02/18/2012   Erectile dysfunction 02/18/2012   Hypogonadism male 02/18/2012   Annual physical exam 02/18/2012    PCP: Monica Becton, MD   REFERRING PROVIDER: Estill Bamberg, MD   REFERRING DIAG: S/p R SIJ fusion  Rationale for Evaluation and Treatment: Rehabilitation  THERAPY DIAG:  Other low back pain  Other abnormalities of gait and mobility  ONSET DATE: 06/21/23 R SIJ fusion  SUBJECTIVE:                                                                                                                                                                                           SUBJECTIVE STATEMENT: 08/19/2023 no pain at present, mild fatigue after last session. Otherwise no new updates.   PERTINENT HISTORY:  Prior back surgeries  Per eval - Pt states he was having stabbing pain in R buttocks, low back, and anterior thigh. Thigh pain and buttock pain has essentially resolved per pt. States he is still having some central low back pain. Able to perform most self care tasks but has particular difficulty w/ lower body dressing. He notes walking/standing tolerance has improved compared to before surgery but remains limited. Also has difficulty with prolonged sitting. Denies any bowel/bladder changes, no saddle anesthesia. No N/T, does endorse a couple episodes of RLE foot drop (2-3) since surgery but not consistent or worsening, states he has communicated this with surgical team and no concerns. States their follow up last week went well.  Had WB restrictions initially, using crutches. Has since weaned to full WB no AD. On light duty restrictions at work. Pt states he was initially advised to avoid BLT, unsure if these precautions are still in place but has been limiting.   Post op timeline (DOS 06/21/23) 8 weeks: 08/16/23 10 weeks: 08/30/23 12 weeks: 09/13/23  PAIN:  Are you having pain: none   Per eval -  Location/description: central low back Best-worst over past week: 0-5/10  - aggravating factors: lower body dressing, lying down for prolonged periods, walking longer distance (using cart in grocery store), carrying device for work (around neck) - Easing  factors: rest    PRECAUTIONS: s/p R SIJ fusion 06/21/23  WEIGHT BEARING RESTRICTIONS: WBAT  FALLS:  Has patient fallen in last 6 months? 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   PATIENT GOALS: get back to helping with go cart, get back to golfing, get more flexible, be able to be active   NEXT MD VISIT: March 2025  OBJECTIVE:  Note: Objective measures were completed at Evaluation unless otherwise noted.  DIAGNOSTIC FINDINGS:  S/p R SIJ fusion 06/21/23    SENSATION: Light touch intact BIL LE   POSTURE: increased trunk flexion in sitting and standing, mild lateral shift to L    LUMBAR ROM:   AROM eval  Flexion   Extension   Right lateral flexion   Left lateral flexion   Right rotation   Left rotation    (Blank rows = not tested) Comments: deferred on eval given proximity to surgery  LOWER EXTREMITY ROM:     Active  Right eval Left eval  Hip flexion 85 deg (standing, with slight pull in anterior hip) 90 deg  Hip extension    Hip abduction    Hip adduction    Hip internal rotation    Hip external rotation    Knee flexion    Knee extension    Ankle dorsiflexion    Ankle plantarflexion    Ankle inversion    Ankle eversion     (Blank rows = not tested)  LOWER EXTREMITY MMT:    MMT Right eval Left eval  Hip flexion    Hip extension    Hip abduction    Hip adduction    Hip internal rotation    Hip external rotation    Knee flexion    Knee extension    Ankle dorsiflexion  Ankle plantarflexion    Ankle inversion    Ankle eversion     (Blank rows = not tested) Comments: focal MMT deferred given proximity to  surgery  LUMBAR SPECIAL TESTS:  Deferred given surgery  FUNCTIONAL TESTS:  5xSTS: 15.03sec no UE support, mild pull  TUG: 9.29sec TUG Staircase assessment: no rails, good mechanics, reciprocal pattern. Mildly reduced stance time on RLE compared to L  GAIT: Distance walked: within clinic  Assistive device utilized: None Level of assistance: Complete Independence Comments: antalgic gait on RLE, reduced hip extension/flexion on RLE, reduced truncal rotation/arm swing R more so than L   TREATMENT DATE:  [facility] Adult PT Treatment:                                                DATE: 08/19/23 Therapeutic Exercise: Nu step L5, seat 7, arms 8, 5 min during subjective Green paloff 2x10 BIL cues for form Green band row 2x15 for muscular endurance, cues for reduced elbow compensations Green band shoulder ext 2x15 for muscular endurance Deficit heel raise 3x8 w/ UE support   Neuromuscular re-ed: Retro walking at counter 5 laps for posterior chain activation, postural stability; supervision no UE support  Airex step up 2x12 weaning UE support, BIL LE; cues for quad activation, postural control Lateral airex step up x8 RLE only  Therapeutic Activity: STS from mat; 3x10 lowering mat each set, emphasis  Cone weaving (6 cones) for environmental navigation, cues for mechanics and weight shifting; 5 laps    [facility] Adult PT Treatment:                                                DATE: 08/15/23 Therapeutic Exercise: Nu step 6 min during subjective LE/UE; seat at 7, L5, arms at 8 Standing heel raises 2x12 Green band row 2x12 cues for pacing and setup Green band shoulder extension 2x12  Neuromuscular re-ed: Yoga block / namastegg step overs, 2 of each, RLE leading, cues for soft landing, stance limb stability, pacing, 2x3 laps  Airex step ups BIL 2x12 w/ UE support cues for quad activation, single limb stability, posture Retro walking (step to) along counter 2 laps cues for posture, step  length Retro walking (step through) 2x2 laps     [facility] Adult PT Treatment:                                                DATE: 08/13/23 Therapeutic Exercise: Nustep L6 x 5 min for warm up Heel raises 2 x 10 Rows green TB 2 x 10 Shoulder ext green TB 2 x 10 Pallof green TB x 10 bilat STS x 10 --> x 10 with 5# weight Seated ab isometric with physioball 10 x 2 sec hold Seated oblique isometric physioball 10 x 2 sec hold bilat Resisted walking 10# fwd/bkwd x 10, laterally x 5 bilat Slow march 2 x 10  Therapeutic Activity: Review of HEP, relevant anatomy and rationale for treatment  PATIENT EDUCATION:  Education details: rationale for interventions, HEP  Person educated: Patient Education method: Explanation, Demonstration, Tactile cues, Verbal cues Education comprehension: verbalized understanding, returned demonstration, verbal cues required, tactile cues required, and needs further education     HOME EXERCISE PROGRAM: Access Code: [email or web address] URL: [email or web address] Date: 08/19/2023 Prepared by: Fransisco Hertz  Exercises - Sit to Stand with Armchair  - 2-3 x daily - 1 sets - 10 reps - Heel Raises with Counter Support  - 2-3 x daily - 1 sets - 15 reps - Standing March with Counter Support  - 2-3 x daily - 1 sets - 10-15 reps - Standing Anti-Rotation Press with Anchored Resistance  - 2-3 x daily - 1 sets - 10 reps - Shoulder extension with resistance - Neutral  - 2-3 x daily - 1 sets - 12-15 reps - Standing Shoulder Row with Anchored Resistance  - 2-3 x daily - 1 sets - 12-15 reps  ASSESSMENT:  CLINICAL IMPRESSION: Pt arrives without pain, no issues after last session. Today continuing to work on Programmer, systems exercises, progressing for volume without issue. Also working on postural stability training as above. No  adverse events, tolerates well with cues as above. Gait/transfer mechanics improving well. Recommend continuing along current POC in order to address relevant deficits and improve functional tolerance. Pt departs today's session in no acute distress, all voiced questions/concerns addressed appropriately from PT perspective.      GOALS:   SHORT TERM GOALS: Target date: 09/06/2023 Pt will demonstrate appropriate understanding and performance of initially prescribed HEP in order to facilitate improved independence with management of symptoms.  Baseline: HEP provided on eval Goal status: INITIAL   2. Pt will improve at least MCID on FOTO in order to demonstrate improved perception of function due to symptoms.  Baseline: FOTO TBD  Goal status: INITIAL    LONG TERM GOALS: Target date: 10/04/2023     Pt will meet predicted score on FOTO in order to demonstrate improved perception of functional status due to symptoms.  Baseline: FOTO TBD Goal status: INITIAL  2.  Pt will demonstrate at least 75% normal lumbar rotation AROM in order to demonstrate improved tolerance to functional movement patterns.  Baseline: deferred on eval given proximity to surgery Goal status: INITIAL  3.  Pt will demonstrate hip MMT of at least 4+/5 bilat in order to demonstrate improved strength for functional movements.  Baseline: deferred on eval given proximity to surgery Goal status: INITIAL  4. Pt will perform 5xSTS in <10 sec in order to demonstrate reduced fall risk and improved functional independence. (MCID of 2.3sec)  Baseline: 15sec  Goal status: INITIAL   5. Pt will report/demonstrate ability to stand/walk at least 20 min with less than 2 pt increase on NPS or altered mechanics from baseline in order to facilitate improved tolerance to job tasks and community navigation.  Baseline: reports limited ambulation, using shopping cart w/ grocery store  Goal status: INITIAL   PLAN:  PT FREQUENCY:  2x/week  PT DURATION: 8 weeks  PLANNED INTERVENTIONS: 97164- PT Re-evaluation, 97110-Therapeutic exercises, 97530- Therapeutic activity, 97112- Neuromuscular re-education, 97535- Self Care, 54098- Manual therapy, [phone]- Gait training, Patient/Family education, Balance training, Stair training, Taping, Dry Needling, Joint mobilization, Spinal mobilization, Scar mobilization, Cryotherapy, and Moist heat.  PLAN FOR NEXT SESSION: Review/update HEP PRN. Initial emphasis on functional mobility, mechanics, symmetrical WB. Symptom modification strategies as indicated/appropriate.   [name] Murrain PT, DPT 08/19/2023 7:56 AM

## 2023-08-21 ENCOUNTER — Ambulatory Visit: Payer: PRIVATE HEALTH INSURANCE | Admitting: Physical Therapy

## 2023-08-21 ENCOUNTER — Encounter: Payer: Self-pay | Admitting: Physical Therapy

## 2023-08-21 DIAGNOSIS — M5459 Other low back pain: Secondary | ICD-10-CM

## 2023-08-21 DIAGNOSIS — R2689 Other abnormalities of gait and mobility: Secondary | ICD-10-CM

## 2023-08-21 NOTE — Therapy (Signed)
 OUTPATIENT PHYSICAL THERAPY THORACOLUMBAR TREATMENT   Patient Name: Richard Gross MRN: [id number] DOB:Apr 17, 1961, 63 y.o., male Today's Date: 08/21/2023  END OF SESSION:  PT End of Session - 08/21/23 0805     Visit Number 5    Number of Visits 17    Date for PT Re-Evaluation 10/04/23    PT Start Time 0803    PT Stop Time 0844    PT Time Calculation (min) 41 min    Activity Tolerance Patient tolerated treatment well                 Past Medical History:  Diagnosis Date   Complication of anesthesia    History of kidney stones    PONV (postoperative nausea and vomiting)    Past Surgical History:  Procedure Laterality Date   ANTERIOR LAT LUMBAR FUSION Left 08/31/2021   Procedure: LEFT-SIDED LATERAL INTERBODY FUSION DECOMPRESSION LUMBAR 3 - LUMBAR 4 WITH INSTRUMENTATION AND ALLOGRAFT;  Surgeon: Beuford Anes, MD;  Location: [facility] OR;  Service: Orthopedics;  Laterality: Left;   LUMBAR LAMINECTOMY/DECOMPRESSION MICRODISCECTOMY N/A 12/31/2018   Procedure: LUMBAR 2 - LUMBAR 5 DECOMPRESSION;  Surgeon: Beuford Anes, MD;  Location: [facility] OR;  Service: Orthopedics;  Laterality: N/A;   TONSILLECTOMY     widsom teeth     Patient Active Problem List   Diagnosis Date Noted   Skin lesion of left ear 04/16/2023   Diverticulosis 12/25/2022   Presbycusis 04/27/2022   Family history of abdominal aortic aneurysm 01/18/2021   Haglund's deformity of both heels 02/19/2019   Mass of leg, left 11/15/2017   Obstructive uropathy 05/10/2017   Kidney stone on left side 12/31/2016   Impingement syndrome of both shoulders 07/17/2016   Primary osteoarthritis of left knee 06/19/2016   Bell's palsy 12/01/2012   Obesity 12/01/2012   Hyperlipidemia 03/24/2012   Cervical radiculopathy 02/18/2012   Lumbar spinal stenosis 02/18/2012   Erectile dysfunction 02/18/2012   Hypogonadism male 02/18/2012   Annual physical exam 02/18/2012    PCP: Curtis Debby PARAS, MD   REFERRING PROVIDER: Beuford Anes, MD   REFERRING DIAG: S/p R SIJ fusion  Rationale for Evaluation and Treatment: Rehabilitation  THERAPY DIAG:  Other low back pain  Other abnormalities of gait and mobility  ONSET DATE: 06/21/23 R SIJ fusion  SUBJECTIVE:                                                                                                                                                                                           SUBJECTIVE STATEMENT: 08/21/2023 No pain at present, no issues after last session. Otherwise no new updates  PERTINENT HISTORY:  Prior back surgeries  Per eval - Pt states he was having stabbing pain in R buttocks, low back, and anterior thigh. Thigh pain and buttock pain has essentially resolved per pt. States he is still having some central low back pain. Able to perform most self care tasks but has particular difficulty w/ lower body dressing. He notes walking/standing tolerance has improved compared to before surgery but remains limited. Also has difficulty with prolonged sitting. Denies any bowel/bladder changes, no saddle anesthesia. No N/T, does endorse a couple episodes of RLE foot drop (2-3) since surgery but not consistent or worsening, states he has communicated this with surgical team and no concerns. States their follow up last week went well.  Had WB restrictions initially, using crutches. Has since weaned to full WB no AD. On light duty restrictions at work. Pt states he was initially advised to avoid BLT, unsure if these precautions are still in place but has been limiting.   Post op timeline (DOS 06/21/23) 8 weeks: 08/16/23 10 weeks: 08/30/23 12 weeks: 09/13/23  PAIN:  Are you having pain: none    Per eval -  Location/description: central low back Best-worst over past week: 0-5/10  - aggravating factors: lower body dressing, lying down for prolonged periods, walking longer distance (using cart in grocery store), carrying device for work (around neck) - Easing  factors: rest    PRECAUTIONS: s/p R SIJ fusion 06/21/23  WEIGHT BEARING RESTRICTIONS: WBAT  FALLS:  Has patient fallen in last 6 months? 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   PATIENT GOALS: get back to helping with go cart, get back to golfing, get more flexible, be able to be active   NEXT MD VISIT: March 2025  OBJECTIVE:  Note: Objective measures were completed at Evaluation unless otherwise noted.  DIAGNOSTIC FINDINGS:  S/p R SIJ fusion 06/21/23    SENSATION: Light touch intact BIL LE   POSTURE: increased trunk flexion in sitting and standing, mild lateral shift to L    LUMBAR ROM:   AROM eval  Flexion   Extension   Right lateral flexion   Left lateral flexion   Right rotation   Left rotation    (Blank rows = not tested) Comments: deferred on eval given proximity to surgery  LOWER EXTREMITY ROM:     Active  Right eval Left eval Right 08/21/23  Hip flexion 85 deg (standing, with slight pull in anterior hip) 90 deg A: 92 deg  Hip extension     Hip abduction     Hip adduction     Hip internal rotation     Hip external rotation     Knee flexion     Knee extension     Ankle dorsiflexion     Ankle plantarflexion     Ankle inversion     Ankle eversion      (Blank rows = not tested)  LOWER EXTREMITY MMT:    MMT Right eval Left eval  Hip flexion    Hip extension    Hip abduction    Hip adduction    Hip internal rotation  Hip external rotation    Knee flexion    Knee extension    Ankle dorsiflexion    Ankle plantarflexion    Ankle inversion    Ankle eversion     (Blank rows = not tested) Comments: focal MMT deferred  given proximity to surgery  LUMBAR SPECIAL TESTS:  Deferred given surgery  FUNCTIONAL TESTS:  5xSTS: 15.03sec no UE support, mild pull  TUG: 9.29sec TUG Staircase assessment: no rails, good mechanics, reciprocal pattern. Mildly reduced stance time on RLE compared to L  GAIT: Distance walked: within clinic  Assistive device utilized: None Level of assistance: Complete Independence Comments: antalgic gait on RLE, reduced hip extension/flexion on RLE, reduced truncal rotation/arm swing R more so than L   TREATMENT DATE:  [facility] Adult PT Treatment:                                                DATE: 08/21/23 Therapeutic Exercise: Nu step 5 min during subjective Deficit heel raises BIL UE support 2x12  Seated march w/ 2x8 RLE only for hip ROM Standing short lever hip extension AROM 2x8 RLE  Standing hip abduction AROM RLE 2x8   Neuromuscular re-ed: Blue band paloff press for rotational stability 2x8 BIL cues for pacing Blue band row 2x8 for periscapular activation, cues for mechanics Blue band shoulder extension 2x8 cues for low trap activation Airex step up 3x10 BIL cues to mitigate quad avoidance, for improved SL stability    [facility] Adult PT Treatment:                                                DATE: 08/19/23 Therapeutic Exercise: Nu step L5, seat 7, arms 8, 5 min during subjective Green paloff 2x10 BIL cues for form Green band row 2x15 for muscular endurance, cues for reduced elbow compensations Green band shoulder ext 2x15 for muscular endurance Deficit heel raise 3x8 w/ UE support   Neuromuscular re-ed: Retro walking at counter 5 laps for posterior chain activation, postural stability; supervision no UE support  Airex step up 2x12 weaning UE support, BIL LE; cues for quad activation, postural control Lateral airex step up x8 RLE only  Therapeutic Activity: STS from mat; 3x10 lowering mat each set, emphasis  Cone weaving (6 cones) for environmental navigation, cues for  mechanics and weight shifting; 5 laps    [facility] Adult PT Treatment:                                                DATE: 08/15/23 Therapeutic Exercise: Nu step 6 min during subjective LE/UE; seat at 7, L5, arms at 8 Standing heel raises 2x12 Green band row 2x12 cues for pacing and setup Green band shoulder extension 2x12  Neuromuscular re-ed: Yoga block / namastegg step overs, 2 of each, RLE leading, cues for soft landing, stance limb stability, pacing, 2x3 laps  Airex step ups BIL 2x12 w/ UE support cues for quad activation, single limb stability, posture Retro walking (step to) along counter 2 laps cues for posture, step length Retro walking (step through) 2x2 laps  [facility] Adult PT Treatment:                                                DATE: 08/13/23 Therapeutic Exercise: Nustep L6 x 5 min for warm up Heel raises 2 x 10 Rows green TB 2 x 10 Shoulder ext green TB 2 x 10 Pallof green TB x 10 bilat STS x 10 --> x 10 with 5# weight Seated ab isometric with physioball 10 x 2 sec hold Seated oblique isometric physioball 10 x 2 sec hold bilat Resisted walking 10# fwd/bkwd x 10, laterally x 5 bilat Slow march 2 x 10  Therapeutic Activity: Review of HEP, relevant anatomy and rationale for treatment                                                                                                                            PATIENT EDUCATION:  Education details: rationale for interventions, HEP  Person educated: Patient Education method: Explanation, Demonstration, Tactile cues, Verbal cues Education comprehension: verbalized understanding, returned demonstration, verbal cues required, tactile cues required, and needs further education     HOME EXERCISE PROGRAM: Access Code: [email or web address] URL: [email or web address] Date: 08/19/2023 Prepared by: Alm Jenny  Exercises - Sit to Stand with Armchair  - 2-3 x daily - 1 sets - 10 reps - Heel Raises with Counter Support   - 2-3 x daily - 1 sets - 15 reps - Standing March with Counter Support  - 2-3 x daily - 1 sets - 10-15 reps - Standing Anti-Rotation Press with Anchored Resistance  - 2-3 x daily - 1 sets - 10 reps - Shoulder extension with resistance - Neutral  - 2-3 x daily - 1 sets - 12-15 reps - Standing Shoulder Row with Anchored Resistance  - 2-3 x daily - 1 sets - 12-15 reps  ASSESSMENT:  CLINICAL IMPRESSION: Pt arrives w/o pain, no issues after last session. Continuing to work on functional mobility and balance activities. Progressing familiar exercises for volume, emphasis on appropriate mechanics. Also incorporating more active hip mobility within comfortable ROM, pt with improvements in hip flexion compared to initial eval. Tolerates session well, no adverse events. Recommend continuing along current POC in order to address relevant deficits and improve functional tolerance. Pt departs today's session in no acute distress, all voiced questions/concerns addressed appropriately from PT perspective.       GOALS:   SHORT TERM GOALS: Target date: 09/06/2023 Pt will demonstrate appropriate understanding and performance of initially prescribed HEP in order to facilitate improved independence with management of symptoms.  Baseline: HEP provided on eval Goal status: INITIAL   2. Pt will improve at least MCID on FOTO in order to demonstrate improved perception of function due to symptoms.  Baseline: FOTO TBD  Goal status: INITIAL    LONG TERM GOALS: Target date:  10/04/2023     Pt will meet predicted score on FOTO in order to demonstrate improved perception of functional status due to symptoms.  Baseline: FOTO TBD Goal status: INITIAL  2.  Pt will demonstrate at least 75% normal lumbar rotation AROM in order to demonstrate improved tolerance to functional movement patterns.  Baseline: deferred on eval given proximity to surgery Goal status: INITIAL  3.  Pt will demonstrate hip MMT of at least 4+/5  bilat in order to demonstrate improved strength for functional movements.  Baseline: deferred on eval given proximity to surgery Goal status: INITIAL  4. Pt will perform 5xSTS in <10 sec in order to demonstrate reduced fall risk and improved functional independence. (MCID of 2.3sec)  Baseline: 15sec  Goal status: INITIAL   5. Pt will report/demonstrate ability to stand/walk at least 20 min with less than 2 pt increase on NPS or altered mechanics from baseline in order to facilitate improved tolerance to job tasks and community navigation.  Baseline: reports limited ambulation, using shopping cart w/ grocery store  Goal status: INITIAL   PLAN:  PT FREQUENCY: 2x/week  PT DURATION: 8 weeks  PLANNED INTERVENTIONS: 97164- PT Re-evaluation, 97110-Therapeutic exercises, 97530- Therapeutic activity, 97112- Neuromuscular re-education, 97535- Self Care, [id number]- Manual therapy, [phone]- Gait training, Patient/Family education, Balance training, Stair training, Taping, Dry Needling, Joint mobilization, Spinal mobilization, Scar mobilization, Cryotherapy, and Moist heat.  PLAN FOR NEXT SESSION: Review/update HEP PRN. Initial emphasis on functional mobility, mechanics, symmetrical WB. Symptom modification strategies as indicated/appropriate.   Alm DELENA Jenny PT, DPT 08/21/2023 8:47 AM

## 2023-08-23 ENCOUNTER — Other Ambulatory Visit: Payer: Self-pay | Admitting: Sports Medicine

## 2023-08-23 ENCOUNTER — Encounter: Payer: Self-pay | Admitting: Sports Medicine

## 2023-08-23 DIAGNOSIS — E783 Hyperchylomicronemia: Secondary | ICD-10-CM

## 2023-08-23 DIAGNOSIS — N139 Obstructive and reflux uropathy, unspecified: Secondary | ICD-10-CM

## 2023-08-26 ENCOUNTER — Encounter: Payer: Self-pay | Admitting: Physical Therapy

## 2023-08-26 ENCOUNTER — Ambulatory Visit: Payer: PRIVATE HEALTH INSURANCE | Admitting: Physical Therapy

## 2023-08-26 ENCOUNTER — Telehealth: Payer: Self-pay

## 2023-08-26 DIAGNOSIS — M5459 Other low back pain: Secondary | ICD-10-CM

## 2023-08-26 DIAGNOSIS — R2689 Other abnormalities of gait and mobility: Secondary | ICD-10-CM

## 2023-08-26 NOTE — Telephone Encounter (Signed)
 Message not received until after patient seen in office today.

## 2023-08-26 NOTE — Telephone Encounter (Signed)
 Copied from CRM [id number]. Topic: Clinical - Request for Lab/Test Order >> Aug 23, 2023  3:21 PM Antwanette L wrote: Reason for CRM: Patient has an pt appt on 2/10 in the same building as Dr. Sandy Crumb and wants to know if he make an appt on 2/10 at 8am or 8:05am for blood work?Patient can be reached at [phone].

## 2023-08-26 NOTE — Therapy (Signed)
 OUTPATIENT PHYSICAL THERAPY THORACOLUMBAR TREATMENT   Patient Name: Richard Gross MRN: [id number] DOB:1960/08/07, 63 y.o., male Today's Date: 08/26/2023  END OF SESSION:  PT End of Session - 08/26/23 0714     Visit Number 6    Number of Visits 17    Date for PT Re-Evaluation 10/04/23    PT Start Time 0715    PT Stop Time 0755    PT Time Calculation (min) 40 min    Activity Tolerance Patient tolerated treatment well                  Past Medical History:  Diagnosis Date   Complication of anesthesia    History of kidney stones    PONV (postoperative nausea and vomiting)    Past Surgical History:  Procedure Laterality Date   ANTERIOR LAT LUMBAR FUSION Left 08/31/2021   Procedure: LEFT-SIDED LATERAL INTERBODY FUSION DECOMPRESSION LUMBAR 3 - LUMBAR 4 WITH INSTRUMENTATION AND ALLOGRAFT;  Surgeon: Virl Grimes, MD;  Location: [facility] OR;  Service: Orthopedics;  Laterality: Left;   LUMBAR LAMINECTOMY/DECOMPRESSION MICRODISCECTOMY N/A 12/31/2018   Procedure: LUMBAR 2 - LUMBAR 5 DECOMPRESSION;  Surgeon: Virl Grimes, MD;  Location: [facility] OR;  Service: Orthopedics;  Laterality: N/A;   TONSILLECTOMY     widsom teeth     Patient Active Problem List   Diagnosis Date Noted   Skin lesion of left ear 04/16/2023   Diverticulosis 12/25/2022   Presbycusis 04/27/2022   Family history of abdominal aortic aneurysm 01/18/2021   Haglund's deformity of both heels 02/19/2019   Mass of leg, left 11/15/2017   Obstructive uropathy 05/10/2017   Kidney stone on left side 12/31/2016   Impingement syndrome of both shoulders 07/17/2016   Primary osteoarthritis of left knee 06/19/2016   Bell's palsy 12/01/2012   Obesity 12/01/2012   Hyperlipidemia 03/24/2012   Cervical radiculopathy 02/18/2012   Lumbar spinal stenosis 02/18/2012   Erectile dysfunction 02/18/2012   Hypogonadism male 02/18/2012   Annual physical exam 02/18/2012    PCP: Gean Keels, MD   REFERRING PROVIDER:  Virl Grimes, MD   REFERRING DIAG: S/p R SIJ fusion  Rationale for Evaluation and Treatment: Rehabilitation  THERAPY DIAG:  Other low back pain  Other abnormalities of gait and mobility  ONSET DATE: 06/21/23 R SIJ fusion  SUBJECTIVE:                                                                                                                                                                                           SUBJECTIVE STATEMENT: 08/26/2023 No pain at present. Reports some discomfort with heavier activities over the weekend but better  than it was. Some calf soreness after last session but resolved after a day.     PERTINENT HISTORY:  Prior back surgeries  Per eval - Pt states he was having stabbing pain in R buttocks, low back, and anterior thigh. Thigh pain and buttock pain has essentially resolved per pt. States he is still having some central low back pain. Able to perform most self care tasks but has particular difficulty w/ lower body dressing. He notes walking/standing tolerance has improved compared to before surgery but remains limited. Also has difficulty with prolonged sitting. Denies any bowel/bladder changes, no saddle anesthesia. No N/T, does endorse a couple episodes of RLE foot drop (2-3) since surgery but not consistent or worsening, states he has communicated this with surgical team and no concerns. States their follow up last week went well.  Had WB restrictions initially, using crutches. Has since weaned to full WB no AD. On light duty restrictions at work. Pt states he was initially advised to avoid BLT, unsure if these precautions are still in place but has been limiting.   Post op timeline (DOS 06/21/23) 8 weeks: 08/16/23 10 weeks: 08/30/23 12 weeks: 09/13/23  PAIN:  Are you having pain: none     Per eval -  Location/description: central low back Best-worst over past week: 0-5/10  - aggravating factors: lower body dressing, lying down for prolonged  periods, walking longer distance (using cart in grocery store), carrying device for work (around neck) - Easing factors: rest    PRECAUTIONS: s/p R SIJ fusion 06/21/23  WEIGHT BEARING RESTRICTIONS: WBAT  FALLS:  Has patient fallen in last 6 months? 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   PATIENT GOALS: get back to helping with go cart, get back to golfing, get more flexible, be able to be active   NEXT MD VISIT: March 2025  OBJECTIVE:  Note: Objective measures were completed at Evaluation unless otherwise noted.  DIAGNOSTIC FINDINGS:  S/p R SIJ fusion 06/21/23    SENSATION: Light touch intact BIL LE   POSTURE: increased trunk flexion in sitting and standing, mild lateral shift to L    LUMBAR ROM:   AROM eval  Flexion   Extension   Right lateral flexion   Left lateral flexion   Right rotation   Left rotation    (Blank rows = not tested) Comments: deferred on eval given proximity to surgery  LOWER EXTREMITY ROM:     Active  Right eval Left eval Right 08/21/23  Hip flexion 85 deg (standing, with slight pull in anterior hip) 90 deg A: 92 deg  Hip extension     Hip abduction     Hip adduction     Hip internal rotation     Hip external rotation     Knee flexion     Knee extension     Ankle dorsiflexion     Ankle plantarflexion     Ankle inversion     Ankle eversion      (Blank rows = not tested)  LOWER EXTREMITY MMT:    MMT Right eval Left eval  Hip flexion    Hip  extension    Hip abduction    Hip adduction    Hip internal rotation    Hip external rotation    Knee flexion    Knee extension    Ankle dorsiflexion    Ankle  plantarflexion    Ankle inversion    Ankle eversion     (Blank rows = not tested) Comments: focal MMT deferred given proximity to surgery  LUMBAR SPECIAL TESTS:  Deferred given surgery  FUNCTIONAL TESTS:  5xSTS: 15.03sec no UE support, mild pull  TUG: 9.29sec TUG Staircase assessment: no rails, good mechanics, reciprocal pattern. Mildly reduced stance time on RLE compared to L  GAIT: Distance walked: within clinic  Assistive device utilized: None Level of assistance: Complete Independence Comments: antalgic gait on RLE, reduced hip extension/flexion on RLE, reduced truncal rotation/arm swing R more so than L   TREATMENT DATE:  [facility] Adult PT Treatment:                                                DATE: 08/26/23 Therapeutic Exercise: Nu step 5 min during subjective  Slow marches x12 RLE cues for posture Blue band shoulder extension 2x12  Blue band row x12  STS from chair 2x10 Deficit heel raise 2x10 w/ UE support  RLE triple flexion/ext AROM   Neuromuscular re-ed: Airex RLE step up 3x12 cues for SL stability and WB  Airex RLE lateral step up 2x8  Fwd/retro walking 6 laps along counter Cone step overs 4 laps cues for soft impact and postural stability Blue band paloff 2x10 BIL      [facility] Adult PT Treatment:                                                DATE: 08/21/23 Therapeutic Exercise: Nu step 5 min during subjective Deficit heel raises BIL UE support 2x12  Seated march w/ 2x8 RLE only for hip ROM Standing short lever hip extension AROM 2x8 RLE  Standing hip abduction AROM RLE 2x8   Neuromuscular re-ed: Blue band paloff press for rotational stability 2x8 BIL cues for pacing Blue band row 2x8 for periscapular activation, cues for mechanics Blue band shoulder extension 2x8 cues for low trap activation Airex step up 3x10 BIL cues to mitigate quad avoidance, for improved SL stability    [facility] Adult PT Treatment:                                                DATE:  08/19/23 Therapeutic Exercise: Nu step L5, seat 7, arms 8, 5 min during subjective Green paloff 2x10 BIL cues for form Green band row 2x15 for muscular endurance, cues for reduced elbow compensations Green band shoulder ext 2x15 for muscular endurance Deficit heel raise 3x8 w/ UE support   Neuromuscular re-ed: Retro walking at counter 5 laps for posterior chain activation, postural stability; supervision no UE support  Airex step up 2x12 weaning UE support, BIL LE; cues for quad activation, postural control Lateral airex step up x8 RLE only  Therapeutic Activity: STS from mat; 3x10 lowering mat each set, emphasis  Cone weaving (6 cones) for environmental navigation, cues for mechanics and weight shifting; 5 laps    [facility] Adult PT Treatment:                                                DATE: 08/15/23 Therapeutic Exercise: Nu step 6 min during subjective LE/UE; seat at 7, L5, arms at 8 Standing heel raises 2x12 Green band row 2x12 cues for pacing and setup Green band shoulder extension 2x12  Neuromuscular re-ed: Yoga block / namastegg step overs, 2 of each, RLE leading, cues for soft landing, stance limb stability, pacing, 2x3 laps  Airex step ups BIL 2x12 w/ UE support cues for quad activation, single limb stability, posture Retro walking (step to) along counter 2 laps cues for posture, step length Retro walking (step through) 2x2 laps    PATIENT EDUCATION:  Education details: rationale for interventions, HEP  Person educated: Patient Education method: Explanation, Demonstration, Tactile cues, Verbal cues Education comprehension: verbalized understanding, returned demonstration, verbal cues required, tactile cues required, and needs further education     HOME EXERCISE PROGRAM: Access Code: [email or web address] URL: [email or web address] Date: 08/19/2023 Prepared by: Mayme Spearman  Exercises - Sit to Stand with Armchair  - 2-3 x daily - 1 sets - 10 reps - Heel Raises with  Counter Support  - 2-3 x daily - 1 sets - 15 reps - Standing March with Counter Support  - 2-3 x daily - 1 sets - 10-15 reps - Standing Anti-Rotation Press with Anchored Resistance  - 2-3 x daily - 1 sets - 10 reps - Shoulder extension with resistance - Neutral  - 2-3 x daily - 1 sets - 12-15 reps - Standing Shoulder Row with Anchored Resistance  - 2-3 x daily - 1 sets - 12-15 reps  ASSESSMENT:  CLINICAL IMPRESSION: 08/26/2023 Pt arrives w/o pain, no issues after last session. Today continuing to progress balance activities which pt does well with. No adverse events, reports muscular fatigue but no increase in pain. Noted improved postural stability with stepover activities today. Recommend continuing along current POC in order to address relevant deficits and improve functional tolerance. Pt departs today's session in no acute distress, all voiced questions/concerns addressed appropriately from PT perspective.       GOALS:   SHORT TERM GOALS: Target date: 09/06/2023 Pt will demonstrate appropriate understanding and performance of initially prescribed HEP in order to facilitate improved independence with management of symptoms.  Baseline: HEP provided on eval Goal status: INITIAL   2. Pt will improve at least MCID on FOTO in order to demonstrate improved perception of function due to symptoms.  Baseline: FOTO TBD  Goal status: INITIAL    LONG TERM GOALS: Target date: 10/04/2023     Pt will meet predicted score on FOTO in order to demonstrate improved perception of functional status due to symptoms.  Baseline: FOTO TBD Goal status: INITIAL  2.  Pt will demonstrate at least 75% normal lumbar rotation AROM in order to demonstrate improved tolerance to functional movement patterns.  Baseline: deferred on eval given proximity to surgery Goal status: INITIAL  3.  Pt will demonstrate hip MMT of at least 4+/5 bilat in order to demonstrate improved strength for functional movements.   Baseline: deferred on eval given proximity to surgery Goal status: INITIAL  4. Pt will perform 5xSTS in <10 sec  in order to demonstrate reduced fall risk and improved functional independence. (MCID of 2.3sec)  Baseline: 15sec  Goal status: INITIAL   5. Pt will report/demonstrate ability to stand/walk at least 20 min with less than 2 pt increase on NPS or altered mechanics from baseline in order to facilitate improved tolerance to job tasks and community navigation.  Baseline: reports limited ambulation, using shopping cart w/ grocery store  Goal status: INITIAL   PLAN:  PT FREQUENCY: 2x/week  PT DURATION: 8 weeks  PLANNED INTERVENTIONS: 97164- PT Re-evaluation, 97110-Therapeutic exercises, 97530- Therapeutic activity, 97112- Neuromuscular re-education, 97535- Self Care, [id number]- Manual therapy, [phone]- Gait training, Patient/Family education, Balance training, Stair training, Taping, Dry Needling, Joint mobilization, Spinal mobilization, Scar mobilization, Cryotherapy, and Moist heat.  PLAN FOR NEXT SESSION: Review/update HEP PRN. Initial emphasis on functional mobility, mechanics, symmetrical WB. Symptom modification strategies as indicated/appropriate.   Lovett Ruck PT, DPT 08/26/2023 7:57 AM

## 2023-08-27 LAB — CBC
Hematocrit: 48.5 % (ref 37.5–51.0)
Hemoglobin: 16.3 g/dL (ref 13.0–17.7)
MCH: 30.8 pg (ref 26.6–33.0)
MCHC: 33.6 g/dL (ref 31.5–35.7)
MCV: 92 fL (ref 79–97)
Platelets: 257 x10E3/uL (ref 150–450)
RBC: 5.29 x10E6/uL (ref 4.14–5.80)
RDW: 12.7 % (ref 11.6–15.4)
WBC: 5.2 x10E3/uL (ref 3.4–10.8)

## 2023-08-27 LAB — COMPREHENSIVE METABOLIC PANEL WITH GFR
ALT: 22 IU/L (ref 0–44)
AST: 18 IU/L (ref 0–40)
Albumin: 4.5 g/dL (ref 3.9–4.9)
Alkaline Phosphatase: 72 IU/L (ref 44–121)
BUN/Creatinine Ratio: 23 (ref 10–24)
BUN: 24 mg/dL (ref 8–27)
Bilirubin Total: 0.4 mg/dL (ref 0.0–1.2)
CO2: 23 mmol/L (ref 20–29)
Calcium: 9.6 mg/dL (ref 8.6–10.2)
Chloride: 106 mmol/L (ref 96–106)
Creatinine, Ser: 1.04 mg/dL (ref 0.76–1.27)
Globulin, Total: 1.9 g/dL (ref 1.5–4.5)
Glucose: 101 mg/dL — ABNORMAL HIGH (ref 70–99)
Potassium: 4.8 mmol/L (ref 3.5–5.2)
Sodium: 143 mmol/L (ref 134–144)
Total Protein: 6.4 g/dL (ref 6.0–8.5)
eGFR: 81 mL/min/1.73

## 2023-08-27 LAB — HEMOGLOBIN A1C
Est. average glucose Bld gHb Est-mCnc: 120 mg/dL
Hgb A1c MFr Bld: 5.8 % — ABNORMAL HIGH (ref 4.8–5.6)

## 2023-08-27 LAB — TSH: TSH: 2.57 u[IU]/mL (ref 0.450–4.500)

## 2023-08-27 LAB — LIPID PANEL
Chol/HDL Ratio: 5.1 ratio — ABNORMAL HIGH (ref 0.0–5.0)
Cholesterol, Total: 204 mg/dL — ABNORMAL HIGH (ref 100–199)
HDL: 40 mg/dL
LDL Chol Calc (NIH): 118 mg/dL — ABNORMAL HIGH (ref 0–99)
Triglycerides: 261 mg/dL — ABNORMAL HIGH (ref 0–149)
VLDL Cholesterol Cal: 46 mg/dL — ABNORMAL HIGH (ref 5–40)

## 2023-08-27 LAB — PSA, TOTAL AND FREE
PSA, Free Pct: 31.4 %
PSA, Free: 0.22 ng/mL
Prostate Specific Ag, Serum: 0.7 ng/mL (ref 0.0–4.0)

## 2023-08-27 NOTE — Therapy (Signed)
OUTPATIENT PHYSICAL THERAPY THORACOLUMBAR TREATMENT   Patient Name: Richard Gross MRN: [id number] DOB:09-09-1960, 63 y.o., male Today's Date: 08/28/2023  END OF SESSION:  PT End of Session - 08/28/23 0709     Visit Number 7    Number of Visits 17    Date for PT Re-Evaluation 10/04/23    PT Start Time 0712    PT Stop Time 0756    PT Time Calculation (min) 44 min    Activity Tolerance Patient tolerated treatment well               Past Medical History:  Diagnosis Date   Complication of anesthesia    History of kidney stones    PONV (postoperative nausea and vomiting)    Past Surgical History:  Procedure Laterality Date   ANTERIOR LAT LUMBAR FUSION Left 08/31/2021   Procedure: LEFT-SIDED LATERAL INTERBODY FUSION DECOMPRESSION LUMBAR 3 - LUMBAR 4 WITH INSTRUMENTATION AND ALLOGRAFT;  Surgeon: Estill Bamberg, MD;  Location: [facility] OR;  Service: Orthopedics;  Laterality: Left;   LUMBAR LAMINECTOMY/DECOMPRESSION MICRODISCECTOMY N/A 12/31/2018   Procedure: LUMBAR 2 - LUMBAR 5 DECOMPRESSION;  Surgeon: Estill Bamberg, MD;  Location: [facility] OR;  Service: Orthopedics;  Laterality: N/A;   TONSILLECTOMY     widsom teeth     Patient Active Problem List   Diagnosis Date Noted   Skin lesion of left ear 04/16/2023   Diverticulosis 12/25/2022   Presbycusis 04/27/2022   Family history of abdominal aortic aneurysm 01/18/2021   Haglund's deformity of both heels 02/19/2019   Mass of leg, left 11/15/2017   Obstructive uropathy 05/10/2017   Kidney stone on left side 12/31/2016   Impingement syndrome of both shoulders 07/17/2016   Primary osteoarthritis of left knee 06/19/2016   Bell's palsy 12/01/2012   Obesity 12/01/2012   Hyperlipidemia 03/24/2012   Cervical radiculopathy 02/18/2012   Lumbar spinal stenosis 02/18/2012   Erectile dysfunction 02/18/2012   Hypogonadism male 02/18/2012   Annual physical exam 02/18/2012    PCP: Monica Becton, MD   REFERRING PROVIDER: Estill Bamberg, MD   REFERRING DIAG: S/p R SIJ fusion  Rationale for Evaluation and Treatment: Rehabilitation  THERAPY DIAG:  Other low back pain  Other abnormalities of gait and mobility  ONSET DATE: 06/21/23 R SIJ fusion  SUBJECTIVE:                                                                                                                                                                                           SUBJECTIVE STATEMENT: 08/28/2023 Pt states he did a lot of activity at work yesterday and felt okay. No issues after last  session.    PERTINENT HISTORY:  Prior back surgeries  Per eval - Pt states he was having stabbing pain in R buttocks, low back, and anterior thigh. Thigh pain and buttock pain has essentially resolved per pt. States he is still having some central low back pain. Able to perform most self care tasks but has particular difficulty w/ lower body dressing. He notes walking/standing tolerance has improved compared to before surgery but remains limited. Also has difficulty with prolonged sitting. Denies any bowel/bladder changes, no saddle anesthesia. No N/T, does endorse a couple episodes of RLE foot drop (2-3) since surgery but not consistent or worsening, states he has communicated this with surgical team and no concerns. States their follow up last week went well.  Had WB restrictions initially, using crutches. Has since weaned to full WB no AD. On light duty restrictions at work. Pt states he was initially advised to avoid BLT, unsure if these precautions are still in place but has been limiting.   Post op timeline (DOS 06/21/23) 8 weeks: 08/16/23 10 weeks: 08/30/23 12 weeks: 09/13/23  PAIN:  Are you having pain: none     Per eval -  Location/description: central low back Best-worst over past week: 0-5/10  - aggravating factors: lower body dressing, lying down for prolonged periods, walking longer distance (using cart in grocery store), carrying device for work  (around neck) - Easing factors: rest    PRECAUTIONS: s/p R SIJ fusion 06/21/23 Per 08/27/23 office communication w/ physician office - no lifting <10#, no stairs, no ladders, no ROM restrictions  WEIGHT BEARING RESTRICTIONS: WBAT  FALLS:  Has patient fallen in last 6 months? 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   PATIENT GOALS: get back to helping with go cart, get back to golfing, get more flexible, be able to be active   NEXT MD VISIT: March 2025  OBJECTIVE:  Note: Objective measures were completed at Evaluation unless otherwise noted.  DIAGNOSTIC FINDINGS:  S/p R SIJ fusion 06/21/23    SENSATION: Light touch intact BIL LE   POSTURE: increased trunk flexion in sitting and standing, mild lateral shift to L    LUMBAR ROM:   AROM eval  Flexion   Extension   Right lateral flexion   Left lateral flexion   Right rotation   Left rotation    (Blank rows = not tested) Comments: deferred on eval given proximity to surgery  LOWER EXTREMITY ROM:     Active  Right eval Left eval Right 08/21/23  Hip flexion 85 deg (standing, with slight pull in anterior hip) 90 deg A: 92 deg  Hip extension     Hip abduction     Hip adduction     Hip internal rotation     Hip external rotation     Knee flexion     Knee extension     Ankle dorsiflexion     Ankle plantarflexion     Ankle inversion     Ankle eversion      (Blank rows = not tested)  LOWER EXTREMITY MMT:    MMT Right eval Left eval  Hip flexion  Hip extension    Hip abduction    Hip adduction    Hip internal rotation    Hip external rotation    Knee flexion    Knee extension     Ankle dorsiflexion    Ankle plantarflexion    Ankle inversion    Ankle eversion     (Blank rows = not tested) Comments: focal MMT deferred given proximity to surgery  LUMBAR SPECIAL TESTS:  Deferred given surgery  FUNCTIONAL TESTS:  5xSTS: 15.03sec no UE support, mild pull  TUG: 9.29sec TUG Staircase assessment: no rails, good mechanics, reciprocal pattern. Mildly reduced stance time on RLE compared to L  GAIT: Distance walked: within clinic  Assistive device utilized: None Level of assistance: Complete Independence Comments: antalgic gait on RLE, reduced hip extension/flexion on RLE, reduced truncal rotation/arm swing R more so than L   TREATMENT DATE:  [facility] Adult PT Treatment:                                                DATE: 08/28/23 Therapeutic Exercise: Nu step L5 5 min during subjective, LE/UE LTR 3x5 BIL Seated swiss ball fwd flexion 2x8  HEP update + education  Neuromuscular re-ed: Airex blue band row 2x12 Airex shoulder extension, blue band 2x12 Cone step overs (5) for postural stability, cues for soft landing ; 4 laps  3 laps dual tasking practice ; vertical ball toss, last lap adding in cognitive dual task   [facility] Adult PT Treatment:                                                DATE: 08/26/23 Therapeutic Exercise: Nu step 5 min during subjective  Slow marches x12 RLE cues for posture Blue band shoulder extension 2x12  Blue band row x12  STS from chair 2x10 Deficit heel raise 2x10 w/ UE support  RLE triple flexion/ext AROM   Neuromuscular re-ed: Airex RLE step up 3x12 cues for SL stability and WB  Airex RLE lateral step up 2x8  Fwd/retro walking 6 laps along counter Cone step overs 4 laps cues for soft impact and postural stability Blue band paloff 2x10 BIL    [facility] Adult PT Treatment:                                                DATE: 08/21/23 Therapeutic Exercise: Nu step 5 min during subjective Deficit heel raises BIL UE support 2x12  Seated  march w/ 2x8 RLE only for hip ROM Standing short lever hip extension AROM 2x8 RLE  Standing hip abduction AROM RLE 2x8   Neuromuscular re-ed: Blue band paloff press for rotational stability 2x8 BIL cues for pacing Blue band row 2x8 for periscapular activation, cues for mechanics Blue band shoulder extension 2x8 cues for low trap activation Airex step up 3x10 BIL cues to mitigate quad avoidance, for improved SL stability    [facility] Adult PT Treatment:  DATE: 08/19/23 Therapeutic Exercise: Nu step L5, seat 7, arms 8, 5 min during subjective Green paloff 2x10 BIL cues for form Green band row 2x15 for muscular endurance, cues for reduced elbow compensations Green band shoulder ext 2x15 for muscular endurance Deficit heel raise 3x8 w/ UE support   Neuromuscular re-ed: Retro walking at counter 5 laps for posterior chain activation, postural stability; supervision no UE support  Airex step up 2x12 weaning UE support, BIL LE; cues for quad activation, postural control Lateral airex step up x8 RLE only  Therapeutic Activity: STS from mat; 3x10 lowering mat each set, emphasis  Cone weaving (6 cones) for environmental navigation, cues for mechanics and weight shifting; 5 laps  PATIENT EDUCATION:  Education details: rationale for interventions, HEP  Person educated: Patient Education method: Explanation, Demonstration, Tactile cues, Verbal cues Education comprehension: verbalized understanding, returned demonstration, verbal cues required, tactile cues required, and needs further education     HOME EXERCISE PROGRAM: Access Code: [email or web address] URL: [email or web address] Date: 08/28/2023 Prepared by: Fransisco Hertz  Exercises - Sit to Stand with Armchair  - 2-3 x daily - 1 sets - 10 reps - Standing Anti-Rotation Press with Anchored Resistance  - 2-3 x daily - 1 sets - 10 reps - Shoulder extension with resistance - Neutral  - 2-3 x daily -  1 sets - 12-15 reps - Standing Shoulder Row with Anchored Resistance  - 2-3 x daily - 1 sets - 12-15 reps - Seated Flexion Stretch with Swiss Ball  - 2-3 x daily - 1 sets - 8 reps  ASSESSMENT:  CLINICAL IMPRESSION: 08/28/2023 Pt arrives w/o pain, no issues after last session. Today introducing gentle ROM activities for low back which pt tolerates well, no pain. Also progressing core/periscapular stability work which pt tolerates well. No adverse events, cues as above. Continues with mild alterations in gait kinematics but these are improving ; they are challenged with addition of dual tasking today but no LOB. Recommend continuing along current POC in order to address relevant deficits and improve functional tolerance. Pt departs today's session in no acute distress, all voiced questions/concerns addressed appropriately from PT perspective.     GOALS:   SHORT TERM GOALS: Target date: 09/06/2023 Pt will demonstrate appropriate understanding and performance of initially prescribed HEP in order to facilitate improved independence with management of symptoms.  Baseline: HEP provided on eval Goal status: INITIAL   2. Pt will improve at least MCID on FOTO in order to demonstrate improved perception of function due to symptoms.  Baseline: FOTO TBD  Goal status: INITIAL    LONG TERM GOALS: Target date: 10/04/2023     Pt will meet predicted score on FOTO in order to demonstrate improved perception of functional status due to symptoms.  Baseline: FOTO TBD Goal status: INITIAL  2.  Pt will demonstrate at least 75% normal lumbar rotation AROM in order to demonstrate improved tolerance to functional movement patterns.  Baseline: deferred on eval given proximity to surgery Goal status: INITIAL  3.  Pt will demonstrate hip MMT of at least 4+/5 bilat in order to demonstrate improved strength for functional movements.  Baseline: deferred on eval given proximity to surgery Goal status: INITIAL  4. Pt  will perform 5xSTS in <10 sec in order to demonstrate reduced fall risk and improved functional independence. (MCID of 2.3sec)  Baseline: 15sec  Goal status: INITIAL   5. Pt will report/demonstrate ability to stand/walk at least 20 min with less than 2 pt increase on NPS  or altered mechanics from baseline in order to facilitate improved tolerance to job tasks and community navigation.  Baseline: reports limited ambulation, using shopping cart w/ grocery store  Goal status: INITIAL   PLAN:  PT FREQUENCY: 2x/week  PT DURATION: 8 weeks  PLANNED INTERVENTIONS: 97164- PT Re-evaluation, 97110-Therapeutic exercises, 97530- Therapeutic activity, 97112- Neuromuscular re-education, 97535- Self Care, 16109- Manual therapy, [id number]- Gait training, Patient/Family education, Balance training, Stair training, Taping, Dry Needling, Joint mobilization, Spinal mobilization, Scar mobilization, Cryotherapy, and Moist heat.  PLAN FOR NEXT SESSION: Review/update HEP PRN. Initial emphasis on functional mobility, mechanics, symmetrical WB. Symptom modification strategies as indicated/appropriate.   [name] Murrain PT, DPT 08/28/2023 7:58 AM

## 2023-08-28 ENCOUNTER — Encounter: Payer: Self-pay | Admitting: Physical Therapy

## 2023-08-28 ENCOUNTER — Ambulatory Visit (INDEPENDENT_AMBULATORY_CARE_PROVIDER_SITE_OTHER): Payer: PRIVATE HEALTH INSURANCE | Admitting: Sports Medicine

## 2023-08-28 ENCOUNTER — Encounter: Payer: Self-pay | Admitting: Sports Medicine

## 2023-08-28 ENCOUNTER — Ambulatory Visit: Payer: PRIVATE HEALTH INSURANCE | Admitting: Physical Therapy

## 2023-08-28 VITALS — BP 158/82 | HR 98 | Ht 68.0 in | Wt 260.0 lb

## 2023-08-28 DIAGNOSIS — E783 Hyperchylomicronemia: Secondary | ICD-10-CM

## 2023-08-28 DIAGNOSIS — R2689 Other abnormalities of gait and mobility: Secondary | ICD-10-CM

## 2023-08-28 DIAGNOSIS — E6609 Other obesity due to excess calories: Secondary | ICD-10-CM | POA: Diagnosis not present

## 2023-08-28 DIAGNOSIS — N139 Obstructive and reflux uropathy, unspecified: Secondary | ICD-10-CM | POA: Diagnosis not present

## 2023-08-28 DIAGNOSIS — M5459 Other low back pain: Secondary | ICD-10-CM | POA: Diagnosis not present

## 2023-08-28 MED ORDER — ZEPBOUND 2.5 MG/0.5ML ~~LOC~~ SOAJ
2.5000 mg | SUBCUTANEOUS | 0 refills | Status: DC
Start: 2023-08-28 — End: 2023-09-30

## 2023-08-28 NOTE — Assessment & Plan Note (Signed)
Hypertriglyceridemia on fenofibrate and Zetia. He is statin intolerant. We will try to get his weight down to improve his triglycerides but if this is not successful we will certainly consider niacin.

## 2023-08-28 NOTE — Progress Notes (Signed)
    Procedures performed today:    None.  Independent interpretation of notes and tests performed by another provider:   None.  Brief History, Exam, Impression, and Recommendations:    Obesity Pleasant 63 year old male, he has gained a lot of weight, he does have high blood pressure, hypertriglyceridemia, arthritic changes in multiple sites. For this reason we will go ahead and get him started on Zepbound. If this is not covered we can try [facility]. Return to see me after the first month of medication to evaluate tolerance.  For insurance purposes he will be enrolled in a multidisciplinary weight loss program with calorie counting, and exercise prescription, he will be on no other weight loss medication and he has no contraindications to a GLP-1.  Hyperlipidemia Hypertriglyceridemia on fenofibrate and Zetia. He is statin intolerant. We will try to get his weight down to improve his triglycerides but if this is not successful we will certainly consider niacin.  I spent 30 minutes of total time managing this patient today, this includes chart review, face to face, and non-face to face time.  ____________________________________________ Ihor Austin. Benjamin Stain, M.D., ABFM., CAQSM., AME. Primary Care and Sports Medicine [place] [facility]  Adjunct Professor of Family Medicine  [employer]  Restaurant manager, fast food

## 2023-08-28 NOTE — Assessment & Plan Note (Signed)
Pleasant 63 year old male, he has gained a lot of weight, he does have high blood pressure, hypertriglyceridemia, arthritic changes in multiple sites. For this reason we will go ahead and get him started on Zepbound. If this is not covered we can try [facility]. Return to see me after the first month of medication to evaluate tolerance.  For insurance purposes he will be enrolled in a multidisciplinary weight loss program with calorie counting, and exercise prescription, he will be on no other weight loss medication and he has no contraindications to a GLP-1.

## 2023-08-29 ENCOUNTER — Telehealth: Payer: Self-pay

## 2023-08-29 NOTE — Telephone Encounter (Signed)
Patient called office to inform that his medication tirzepatide (ZEPBOUND) 2.5 MG/0.5ML Pen Is not covered through insurance and it is expensive, patient would like to know if he could get an alternative medication, please advise, thanks.

## 2023-09-02 ENCOUNTER — Telehealth: Payer: Self-pay | Admitting: Physical Therapy

## 2023-09-02 ENCOUNTER — Ambulatory Visit: Payer: PRIVATE HEALTH INSURANCE | Admitting: Physical Therapy

## 2023-09-02 NOTE — Therapy (Incomplete)
OUTPATIENT PHYSICAL THERAPY THORACOLUMBAR TREATMENT   Patient Name: Richard Gross MRN: [id number] DOB:1960/12/08, 63 y.o., male Today's Date: 09/02/2023  END OF SESSION:      Past Medical History:  Diagnosis Date   Complication of anesthesia    History of kidney stones    PONV (postoperative nausea and vomiting)    Past Surgical History:  Procedure Laterality Date   ANTERIOR LAT LUMBAR FUSION Left 08/31/2021   Procedure: LEFT-SIDED LATERAL INTERBODY FUSION DECOMPRESSION LUMBAR 3 - LUMBAR 4 WITH INSTRUMENTATION AND ALLOGRAFT;  Surgeon: Estill Bamberg, MD;  Location: [facility] OR;  Service: Orthopedics;  Laterality: Left;   LUMBAR LAMINECTOMY/DECOMPRESSION MICRODISCECTOMY N/A 12/31/2018   Procedure: LUMBAR 2 - LUMBAR 5 DECOMPRESSION;  Surgeon: Estill Bamberg, MD;  Location: [facility] OR;  Service: Orthopedics;  Laterality: N/A;   TONSILLECTOMY     widsom teeth     Patient Active Problem List   Diagnosis Date Noted   Skin lesion of left ear 04/16/2023   Diverticulosis 12/25/2022   Presbycusis 04/27/2022   Family history of abdominal aortic aneurysm 01/18/2021   Haglund's deformity of both heels 02/19/2019   Mass of leg, left 11/15/2017   Obstructive uropathy 05/10/2017   Kidney stone on left side 12/31/2016   Impingement syndrome of both shoulders 07/17/2016   Primary osteoarthritis of left knee 06/19/2016   Bell's palsy 12/01/2012   Obesity 12/01/2012   Hyperlipidemia 03/24/2012   Cervical radiculopathy 02/18/2012   Lumbar spinal stenosis 02/18/2012   Erectile dysfunction 02/18/2012   Hypogonadism male 02/18/2012   Annual physical exam 02/18/2012    PCP: Monica Becton, MD   REFERRING PROVIDER: Estill Bamberg, MD   REFERRING DIAG: S/p R SIJ fusion  Rationale for Evaluation and Treatment: Rehabilitation  THERAPY DIAG:  No diagnosis found.  ONSET DATE: 06/21/23 R SIJ fusion  SUBJECTIVE:                                                                                                                                                                                            SUBJECTIVE STATEMENT: 09/02/2023 ***  *** Pt states he did a lot of activity at work yesterday and felt okay. No issues after last session.    PERTINENT HISTORY:  Prior back surgeries  Per eval - Pt states he was having stabbing pain in R buttocks, low back, and anterior thigh. Thigh pain and buttock pain has essentially resolved per pt. States he is still having some central low back pain. Able to perform most self care tasks but has particular difficulty w/ lower body dressing. He notes walking/standing tolerance has improved compared to before surgery but remains  limited. Also has difficulty with prolonged sitting. Denies any bowel/bladder changes, no saddle anesthesia. No N/T, does endorse a couple episodes of RLE foot drop (2-3) since surgery but not consistent or worsening, states he has communicated this with surgical team and no concerns. States their follow up last week went well.  Had WB restrictions initially, using crutches. Has since weaned to full WB no AD. On light duty restrictions at work. Pt states he was initially advised to avoid BLT, unsure if these precautions are still in place but has been limiting.   Post op timeline (DOS 06/21/23) 10 weeks: 08/30/23 12 weeks: 09/13/23  PAIN:  Are you having pain: none     Per eval -  Location/description: central low back Best-worst over past week: 0-5/10  - aggravating factors: lower body dressing, lying down for prolonged periods, walking longer distance (using cart in grocery store), carrying device for work (around neck) - Easing factors: rest    PRECAUTIONS: s/p R SIJ fusion 06/21/23 Per 08/27/23 office communication w/ physician office - no lifting <10#, no stairs, no ladders, no ROM restrictions  WEIGHT BEARING RESTRICTIONS: WBAT  FALLS:  Has patient fallen in last 6 months? 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   PATIENT GOALS: get back to helping with go cart, get back to golfing, get more flexible, be able to be active   NEXT MD VISIT: March 2025  OBJECTIVE:  Note: Objective measures were completed at Evaluation unless otherwise noted.  DIAGNOSTIC FINDINGS:  S/p R SIJ fusion 06/21/23    SENSATION: Light touch intact BIL LE   POSTURE: increased trunk flexion in sitting and standing, mild lateral shift to L    LUMBAR ROM:   AROM eval  Flexion   Extension   Right lateral flexion   Left lateral flexion   Right rotation   Left rotation    (Blank rows = not tested) Comments: deferred on eval given proximity to surgery  LOWER EXTREMITY ROM:     Active  Right eval Left eval Right 08/21/23  Hip flexion 85 deg (standing, with slight pull in anterior hip) 90 deg A: 92 deg  Hip extension     Hip abduction     Hip adduction     Hip internal rotation     Hip external rotation     Knee flexion     Knee extension     Ankle dorsiflexion     Ankle plantarflexion     Ankle inversion     Ankle eversion      (Blank rows = not tested)  LOWER EXTREMITY MMT:    MMT Right eval Left eval  Hip flexion    Hip extension    Hip abduction    Hip adduction    Hip internal rotation    Hip external rotation    Knee flexion    Knee extension    Ankle dorsiflexion    Ankle plantarflexion    Ankle inversion    Ankle eversion     (Blank rows = not tested) Comments: focal MMT deferred given proximity to surgery  LUMBAR SPECIAL TESTS:  Deferred given surgery  FUNCTIONAL TESTS:  5xSTS: 15.03sec no UE support, mild pull  TUG: 9.29sec TUG Staircase assessment: no  rails, good mechanics, reciprocal pattern. Mildly reduced stance time on RLE compared to L  GAIT: Distance walked: within clinic  Assistive device utilized: None Level of assistance: Complete Independence Comments: antalgic gait on RLE, reduced hip extension/flexion on RLE, reduced truncal rotation/arm swing R more so than L   TREATMENT DATE:  [facility] Adult PT Treatment:                                                DATE: 09/02/23 Therapeutic Exercise: *** Manual Therapy: *** Neuromuscular re-ed: *** Therapeutic Activity: *** Modalities: *** Self Care: ***    [facility] Adult PT Treatment:                                                DATE: 08/28/23 Therapeutic Exercise: Nu step L5 5 min during subjective, LE/UE LTR 3x5 BIL Seated swiss ball fwd flexion 2x8  HEP update + education  Neuromuscular re-ed: Airex blue band row 2x12 Airex shoulder extension, blue band 2x12 Cone step overs (5) for postural stability, cues for soft landing ; 4 laps  3 laps dual tasking practice ; vertical ball toss, last lap adding in cognitive dual task   [facility] Adult PT Treatment:                                                DATE: 08/26/23 Therapeutic Exercise: Nu step 5 min during subjective  Slow marches x12 RLE cues for posture Blue band shoulder extension 2x12  Blue band row x12  STS from chair 2x10 Deficit heel raise 2x10 w/ UE support  RLE triple flexion/ext AROM   Neuromuscular re-ed: Airex RLE step up 3x12 cues for SL stability and WB  Airex RLE lateral step up 2x8  Fwd/retro walking 6 laps along counter Cone step overs 4 laps cues for soft impact and postural stability Blue band paloff 2x10 BIL    [facility] Adult PT Treatment:                                                DATE: 08/21/23 Therapeutic Exercise: Nu step 5 min during subjective Deficit heel raises BIL UE support 2x12  Seated march w/ 2x8 RLE only for hip ROM Standing short lever hip extension AROM 2x8 RLE  Standing hip  abduction AROM RLE 2x8   Neuromuscular re-ed: Blue band paloff press for rotational stability 2x8 BIL cues for pacing Blue band row 2x8 for periscapular activation, cues for mechanics Blue band shoulder extension 2x8 cues for low trap activation Airex step up 3x10 BIL cues to mitigate quad avoidance, for improved SL stability    [facility] Adult PT Treatment:  DATE: 08/19/23 Therapeutic Exercise: Nu step L5, seat 7, arms 8, 5 min during subjective Green paloff 2x10 BIL cues for form Green band row 2x15 for muscular endurance, cues for reduced elbow compensations Green band shoulder ext 2x15 for muscular endurance Deficit heel raise 3x8 w/ UE support   Neuromuscular re-ed: Retro walking at counter 5 laps for posterior chain activation, postural stability; supervision no UE support  Airex step up 2x12 weaning UE support, BIL LE; cues for quad activation, postural control Lateral airex step up x8 RLE only  Therapeutic Activity: STS from mat; 3x10 lowering mat each set, emphasis  Cone weaving (6 cones) for environmental navigation, cues for mechanics and weight shifting; 5 laps  PATIENT EDUCATION:  Education details: rationale for interventions, HEP  Person educated: Patient Education method: Explanation, Demonstration, Tactile cues, Verbal cues Education comprehension: verbalized understanding, returned demonstration, verbal cues required, tactile cues required, and needs further education     HOME EXERCISE PROGRAM: Access Code: [email or web address] URL: [email or web address] Date: 08/28/2023 Prepared by: Fransisco Hertz  Exercises - Sit to Stand with Armchair  - 2-3 x daily - 1 sets - 10 reps - Standing Anti-Rotation Press with Anchored Resistance  - 2-3 x daily - 1 sets - 10 reps - Shoulder extension with resistance - Neutral  - 2-3 x daily - 1 sets - 12-15 reps - Standing Shoulder Row with Anchored Resistance  - 2-3 x daily - 1 sets -  12-15 reps - Seated Flexion Stretch with Swiss Ball  - 2-3 x daily - 1 sets - 8 reps  ASSESSMENT:  CLINICAL IMPRESSION: 09/02/2023 ***  *** Pt arrives w/o pain, no issues after last session. Today introducing gentle ROM activities for low back which pt tolerates well, no pain. Also progressing core/periscapular stability work which pt tolerates well. No adverse events, cues as above. Continues with mild alterations in gait kinematics but these are improving ; they are challenged with addition of dual tasking today but no LOB. Recommend continuing along current POC in order to address relevant deficits and improve functional tolerance. Pt departs today's session in no acute distress, all voiced questions/concerns addressed appropriately from PT perspective.     GOALS:   SHORT TERM GOALS: Target date: 09/06/2023 Pt will demonstrate appropriate understanding and performance of initially prescribed HEP in order to facilitate improved independence with management of symptoms.  Baseline: HEP provided on eval Goal status: INITIAL   2. Pt will improve at least MCID on FOTO in order to demonstrate improved perception of function due to symptoms.  Baseline: FOTO TBD  Goal status: INITIAL    LONG TERM GOALS: Target date: 10/04/2023     Pt will meet predicted score on FOTO in order to demonstrate improved perception of functional status due to symptoms.  Baseline: FOTO TBD Goal status: INITIAL  2.  Pt will demonstrate at least 75% normal lumbar rotation AROM in order to demonstrate improved tolerance to functional movement patterns.  Baseline: deferred on eval given proximity to surgery Goal status: INITIAL  3.  Pt will demonstrate hip MMT of at least 4+/5 bilat in order to demonstrate improved strength for functional movements.  Baseline: deferred on eval given proximity to surgery Goal status: INITIAL  4. Pt will perform 5xSTS in <10 sec in order to demonstrate reduced fall risk and improved  functional independence. (MCID of 2.3sec)  Baseline: 15sec  Goal status: INITIAL   5. Pt will report/demonstrate ability to stand/walk at least 20 min with less than 2 pt  increase on NPS or altered mechanics from baseline in order to facilitate improved tolerance to job tasks and community navigation.  Baseline: reports limited ambulation, using shopping cart w/ grocery store  Goal status: INITIAL   PLAN:  PT FREQUENCY: 2x/week  PT DURATION: 8 weeks  PLANNED INTERVENTIONS: 97164- PT Re-evaluation, 97110-Therapeutic exercises, 97530- Therapeutic activity, 97112- Neuromuscular re-education, 97535- Self Care, 16109- Manual therapy, [id number]- Gait training, Patient/Family education, Balance training, Stair training, Taping, Dry Needling, Joint mobilization, Spinal mobilization, Scar mobilization, Cryotherapy, and Moist heat.  PLAN FOR NEXT SESSION: Review/update HEP PRN. Initial emphasis on functional mobility, mechanics, symmetrical WB. Symptom modification strategies as indicated/appropriate.   [name] Murrain PT, DPT 09/02/2023 6:56 AM

## 2023-09-02 NOTE — Telephone Encounter (Signed)
Called pt re: this morning's missed appt - states he meant to call, got off work late. Confirmed date/time of next appt

## 2023-09-03 NOTE — Therapy (Addendum)
 OUTPATIENT PHYSICAL THERAPY THORACOLUMBAR TREATMENT   Patient Name: Richard Gross MRN: [id number] DOB:28-Nov-1960, 63 y.o., male Today's Date: 09/04/2023  END OF SESSION:  PT End of Session - 09/04/23 0709     Visit Number 8    Number of Visits 17    Date for PT Re-Evaluation 10/04/23    PT Start Time 0710    PT Stop Time 0755    PT Time Calculation (min) 45 min    Activity Tolerance Patient tolerated treatment well                Past Medical History:  Diagnosis Date   Complication of anesthesia    History of kidney stones    PONV (postoperative nausea and vomiting)    Past Surgical History:  Procedure Laterality Date   ANTERIOR LAT LUMBAR FUSION Left 08/31/2021   Procedure: LEFT-SIDED LATERAL INTERBODY FUSION DECOMPRESSION LUMBAR 3 - LUMBAR 4 WITH INSTRUMENTATION AND ALLOGRAFT;  Surgeon: Virl Grimes, MD;  Location: [facility] OR;  Service: Orthopedics;  Laterality: Left;   LUMBAR LAMINECTOMY/DECOMPRESSION MICRODISCECTOMY N/A 12/31/2018   Procedure: LUMBAR 2 - LUMBAR 5 DECOMPRESSION;  Surgeon: Virl Grimes, MD;  Location: [facility] OR;  Service: Orthopedics;  Laterality: N/A;   TONSILLECTOMY     widsom teeth     Patient Active Problem List   Diagnosis Date Noted   Skin lesion of left ear 04/16/2023   Diverticulosis 12/25/2022   Presbycusis 04/27/2022   Family history of abdominal aortic aneurysm 01/18/2021   Haglund's deformity of both heels 02/19/2019   Mass of leg, left 11/15/2017   Obstructive uropathy 05/10/2017   Kidney stone on left side 12/31/2016   Impingement syndrome of both shoulders 07/17/2016   Primary osteoarthritis of left knee 06/19/2016   Bell's palsy 12/01/2012   Obesity 12/01/2012   Hyperlipidemia 03/24/2012   Cervical radiculopathy 02/18/2012   Lumbar spinal stenosis 02/18/2012   Erectile dysfunction 02/18/2012   Hypogonadism male 02/18/2012   Annual physical exam 02/18/2012    PCP: Gean Keels, MD   REFERRING PROVIDER: Virl Grimes, MD   REFERRING DIAG: S/p R SIJ fusion  Rationale for Evaluation and Treatment: Rehabilitation  THERAPY DIAG:  Other low back pain  Other abnormalities of gait and mobility  ONSET DATE: 06/21/23 R SIJ fusion  SUBJECTIVE:                                                                                                                                                                                           SUBJECTIVE STATEMENT: 09/04/2023 Pt states he did well after last session but towards end of last week he felt he  was "getting tripped up on his foot" a bit more. Does endorse increased heavier activities at work, does endorse some mild L sided muscular pain after lifting an item but no pain around surgical site, no N/T. He also mentions that he had the day off Monday and rested, and when he returned to usual activities yesterday at work he had no further instances of foot drop.     PERTINENT HISTORY:  Prior back surgeries  Per eval - Pt states he was having stabbing pain in R buttocks, low back, and anterior thigh. Thigh pain and buttock pain has essentially resolved per pt. States he is still having some central low back pain. Able to perform most self care tasks but has particular difficulty w/ lower body dressing. He notes walking/standing tolerance has improved compared to before surgery but remains limited. Also has difficulty with prolonged sitting. Denies any bowel/bladder changes, no saddle anesthesia. No N/T, does endorse a couple episodes of RLE foot drop (2-3) since surgery but not consistent or worsening, states he has communicated this with surgical team and no concerns. States their follow up last week went well.  Had WB restrictions initially, using crutches. Has since weaned to full WB no AD. On light duty restrictions at work. Pt states he was initially advised to avoid BLT, unsure if these precautions are still in place but has been limiting.   Post op timeline (DOS  06/21/23) 10 weeks: 08/30/23 12 weeks: 09/13/23  PAIN:  Are you having pain: none     Per eval -  Location/description: central low back Best-worst over past week: 0-5/10  - aggravating factors: lower body dressing, lying down for prolonged periods, walking longer distance (using cart in grocery store), carrying device for work (around neck) - Easing factors: rest    PRECAUTIONS: s/p R SIJ fusion 06/21/23 Per 08/27/23 office communication w/ physician office - no lifting <10#, no stairs, no ladders, no ROM restrictions  WEIGHT BEARING RESTRICTIONS: WBAT  FALLS:  Has patient fallen in last 6 months? 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   PATIENT GOALS: get back to helping with go cart, get back to golfing, get more flexible, be able to be active   NEXT MD VISIT: March 2025  OBJECTIVE:  Note: Objective measures were completed at Evaluation unless otherwise noted.  DIAGNOSTIC FINDINGS:  S/p R SIJ fusion 06/21/23    SENSATION: Light touch intact BIL LE   POSTURE: increased trunk flexion in sitting and standing, mild lateral shift to L    LUMBAR ROM:   AROM eval  Flexion   Extension   Right lateral flexion   Left lateral flexion   Right rotation   Left rotation    (Blank rows = not tested) Comments: deferred on eval given proximity to surgery  LOWER EXTREMITY ROM:     Active  Right eval Left eval Right 08/21/23  Hip flexion 85 deg (standing, with slight pull in anterior hip) 90 deg A: 92 deg  Hip extension     Hip abduction     Hip adduction     Hip internal rotation  Hip external rotation     Knee flexion     Knee extension      Ankle dorsiflexion     Ankle plantarflexion     Ankle inversion     Ankle eversion      (Blank rows = not tested)  LOWER EXTREMITY MMT:    MMT Right eval Left eval  Hip flexion    Hip extension    Hip abduction    Hip adduction    Hip internal rotation    Hip external rotation    Knee flexion    Knee extension    Ankle dorsiflexion    Ankle plantarflexion    Ankle inversion    Ankle eversion     (Blank rows = not tested) Comments: focal MMT deferred given proximity to surgery 09/04/23: ankle eversion and hallux extension symmetrical and WNL; pt does have mild dorsiflexion weakness R compared to L   LUMBAR SPECIAL TESTS:  Deferred given surgery  FUNCTIONAL TESTS:  5xSTS: 15.03sec no UE support, mild pull  TUG: 9.29sec TUG Staircase assessment: no rails, good mechanics, reciprocal pattern. Mildly reduced stance time on RLE compared to L  GAIT: Distance walked: within clinic  Assistive device utilized: None Level of assistance: Complete Independence Comments: antalgic gait on RLE, reduced hip extension/flexion on RLE, reduced truncal rotation/arm swing R more so than L   TREATMENT DATE:  [facility] Adult PT Treatment:                                                DATE: 09/04/23 Therapeutic Exercise: Nu step L5 LE/UE during subjective  Neuromuscular re-ed: Ball toss walking 2 laps  Cone taps 2x10 BIL cues for posture/weight shift Static stance theraband pad 3x30sec eyes open, 2x30sec eyes closed   Therapeutic Activity: High marches 4 laps Retro walking 4 laps Bosu fwd weight shift RLE 2x8 weaning UE support  Self Care: Assessing R ankle and education on monitoring, activity modification, communication w/ provider as needed   [facility] Adult PT Treatment:                                                DATE: 08/28/23 Therapeutic Exercise: Nu step L5 5 min during subjective, LE/UE LTR 3x5 BIL Seated swiss ball fwd flexion 2x8  HEP update +  education  Neuromuscular re-ed: Airex blue band row 2x12 Airex shoulder extension, blue band 2x12 Cone step overs (5) for postural stability, cues for soft landing ; 4 laps  3 laps dual tasking practice ; vertical ball toss, last lap adding in cognitive dual task   [facility] Adult PT Treatment:                                                DATE: 08/26/23 Therapeutic Exercise: Nu step 5 min during subjective  Slow marches x12 RLE cues for posture Blue band shoulder extension 2x12  Blue band row x12  STS from chair 2x10 Deficit heel raise 2x10 w/ UE support  RLE triple flexion/ext AROM   Neuromuscular re-ed: Airex RLE step up 3x12 cues for  SL stability and WB  Airex RLE lateral step up 2x8  Fwd/retro walking 6 laps along counter Cone step overs 4 laps cues for soft impact and postural stability Blue band paloff 2x10 BIL    [facility] Adult PT Treatment:                                                DATE: 08/21/23 Therapeutic Exercise: Nu step 5 min during subjective Deficit heel raises BIL UE support 2x12  Seated march w/ 2x8 RLE only for hip ROM Standing short lever hip extension AROM 2x8 RLE  Standing hip abduction AROM RLE 2x8   Neuromuscular re-ed: Blue band paloff press for rotational stability 2x8 BIL cues for pacing Blue band row 2x8 for periscapular activation, cues for mechanics Blue band shoulder extension 2x8 cues for low trap activation Airex step up 3x10 BIL cues to mitigate quad avoidance, for improved SL stability   PATIENT EDUCATION:  Education details: rationale for interventions, HEP  Person educated: Patient Education method: Explanation, Demonstration, Tactile cues, Verbal cues Education comprehension: verbalized understanding, returned demonstration, verbal cues required, tactile cues required, and needs further education     HOME EXERCISE PROGRAM: Access Code: [email or web address] URL: [email or web address] Date: 09/04/2023 Prepared by: Mayme Spearman  Program Notes - with march, please use empty water bottle as target for tap, as done with cone in clinic  Exercises - Sit to Stand with Armchair  - 2-3 x daily - 1 sets - 10 reps - Standing Anti-Rotation Press with Anchored Resistance  - 2-3 x daily - 1 sets - 10 reps - Shoulder extension with resistance - Neutral  - 2-3 x daily - 1 sets - 12-15 reps - Standing Shoulder Row with Anchored Resistance  - 2-3 x daily - 1 sets - 12-15 reps - Standing March  - 2-3 x daily - 1 sets - 8 reps  ASSESSMENT:  CLINICAL IMPRESSION: 09/04/2023 Pt arrives w/o pain but does endorse some increased foot drop with heavier work activities towards end of last week. He does note that after a day off, symptoms had improved with return to work yesterday. On assessment he does demonstrate some dorsiflexion weakness on R vs L but hallux ext and eversion are symmetrical. He tolerates activities well and mechanics are comparable to prior sessions. Education is provided on activity modification/pacing and close monitoring of symptoms and to communicate with provider should weakness worsen. No adverse events, tolerates session well overall. Recommend continuing along current POC in order to address relevant deficits and improve functional tolerance. Pt departs today's session in no acute distress, all voiced questions/concerns addressed appropriately from PT perspective.     GOALS:   SHORT TERM GOALS: Target date: 09/06/2023 Pt will demonstrate appropriate understanding and performance of initially prescribed HEP in order to facilitate improved independence with management of symptoms.  Baseline: HEP provided on eval 09/04/23: reports good HEP adherence overall, limited by work  Goal status: ONGOING  2. Pt will improve at least MCID on FOTO in order to demonstrate improved perception of function due to symptoms.  Baseline: FOTO TBD Goal status: ONGOING  LONG TERM GOALS: Target date: 10/04/2023     Pt will  meet predicted score on FOTO in order to demonstrate improved perception of functional status due to symptoms.  Baseline: FOTO TBD Goal status: INITIAL  2.  Pt will  demonstrate at least 75% normal lumbar rotation AROM in order to demonstrate improved tolerance to functional movement patterns.  Baseline: deferred on eval given proximity to surgery Goal status: INITIAL  3.  Pt will demonstrate hip MMT of at least 4+/5 bilat in order to demonstrate improved strength for functional movements.  Baseline: deferred on eval given proximity to surgery Goal status: INITIAL  4. Pt will perform 5xSTS in <10 sec in order to demonstrate reduced fall risk and improved functional independence. (MCID of 2.3sec)  Baseline: 15sec  Goal status: INITIAL   5. Pt will report/demonstrate ability to stand/walk at least 20 min with less than 2 pt increase on NPS or altered mechanics from baseline in order to facilitate improved tolerance to job tasks and community navigation.  Baseline: reports limited ambulation, using shopping cart w/ grocery store  Goal status: INITIAL   PLAN:  PT FREQUENCY: 2x/week  PT DURATION: 8 weeks  PLANNED INTERVENTIONS: 97164- PT Re-evaluation, 97110-Therapeutic exercises, 97530- Therapeutic activity, 97112- Neuromuscular re-education, 97535- Self Care, 16109- Manual therapy, [phone]- Gait training, Patient/Family education, Balance training, Stair training, Taping, Dry Needling, Joint mobilization, Spinal mobilization, Scar mobilization, Cryotherapy, and Moist heat.  PLAN FOR NEXT SESSION: Review/update HEP PRN. Initial emphasis on functional mobility, mechanics, symmetrical WB. Symptom modification strategies as indicated/appropriate.   Lovett Ruck PT, DPT 09/04/2023 8:20 AM   PHYSICAL THERAPY DISCHARGE SUMMARY  Visits from Start of Care: 8  Current functional level related to goals / functional outcomes: Decreased pain, improved mobility   Remaining deficits: See  above   Education / Equipment: HEP   Patient agrees to discharge. Patient goals were partially met. Patient is being discharged due to not returning since the last visit.  Lowery Rue, PT,DPT04/30/251:49 PM

## 2023-09-04 ENCOUNTER — Ambulatory Visit: Payer: PRIVATE HEALTH INSURANCE | Admitting: Physical Therapy

## 2023-09-04 ENCOUNTER — Encounter: Payer: Self-pay | Admitting: Physical Therapy

## 2023-09-04 DIAGNOSIS — M5459 Other low back pain: Secondary | ICD-10-CM

## 2023-09-04 DIAGNOSIS — R2689 Other abnormalities of gait and mobility: Secondary | ICD-10-CM

## 2023-09-09 ENCOUNTER — Ambulatory Visit: Payer: PRIVATE HEALTH INSURANCE | Admitting: Physical Therapy

## 2023-09-11 ENCOUNTER — Ambulatory Visit: Payer: PRIVATE HEALTH INSURANCE | Admitting: Physical Therapy

## 2023-09-11 NOTE — Telephone Encounter (Signed)
 Zepbound was sent in at his visit, check with the PA team.

## 2023-09-16 ENCOUNTER — Ambulatory Visit: Payer: PRIVATE HEALTH INSURANCE

## 2023-09-18 ENCOUNTER — Ambulatory Visit: Payer: PRIVATE HEALTH INSURANCE | Admitting: Physical Therapy

## 2023-09-26 NOTE — Telephone Encounter (Signed)
 Prior auth for: ZEPBOUND Determination: Pending as of 09/26/23 Auth #: [id number] Valid from: n/a Patient notified via MyChart

## 2023-09-27 NOTE — Telephone Encounter (Signed)
 The prior auth was submitted to the patient's plan as previously requested. His insurance requires a prior auth form and clinical notes to be faxed to [phone]. Task was completed. Side note: there are no option to submit request electronically on covermymeds (Electronic Prior Authorizations are not accepted by Patient's Plan. Submit via [email or web address]). Auth for Zepbound currently pending an approval.

## 2023-09-30 ENCOUNTER — Ambulatory Visit (INDEPENDENT_AMBULATORY_CARE_PROVIDER_SITE_OTHER): Payer: PRIVATE HEALTH INSURANCE | Admitting: Sports Medicine

## 2023-09-30 ENCOUNTER — Encounter: Payer: Self-pay | Admitting: Sports Medicine

## 2023-09-30 VITALS — BP 134/69 | HR 91 | Temp 98.5°F | Resp 20 | Ht 68.0 in | Wt 257.1 lb

## 2023-09-30 DIAGNOSIS — R052 Subacute cough: Secondary | ICD-10-CM

## 2023-09-30 DIAGNOSIS — E6609 Other obesity due to excess calories: Secondary | ICD-10-CM | POA: Diagnosis not present

## 2023-09-30 DIAGNOSIS — R059 Cough, unspecified: Secondary | ICD-10-CM | POA: Insufficient documentation

## 2023-09-30 MED ORDER — ZEPBOUND 2.5 MG/0.5ML ~~LOC~~ SOAJ
2.5000 mg | SUBCUTANEOUS | 0 refills | Status: DC
Start: 2023-09-30 — End: 2023-11-26

## 2023-09-30 MED ORDER — AZITHROMYCIN 250 MG PO TABS: ORAL_TABLET | ORAL | 0 refills | Status: DC

## 2023-09-30 NOTE — Assessment & Plan Note (Signed)
 Per the patient's plan Zepbound is covered without prior approval however his co-pay was over $800, he will let me know if he would like me to [facility].

## 2023-09-30 NOTE — Progress Notes (Signed)
    Procedures performed today:    None.  Independent interpretation of notes and tests performed by another provider:   None.  Brief History, Exam, Impression, and Recommendations:    Coughing Pleasant 63 year old male, he had a viral illness with chills and coughing approximately 2 to 3 weeks ago, the viral illness has resolved, he feels a lot better but does have a persistent cough that is keeping him up at night. [place] so he cannot have narcotics in the system. Exam is completely normal today. Considering duration of symptoms we will proceed with azithromycin, he will do Delsym over-the-counter and follow-up with me as needed.  Obesity Per the patient's plan Zepbound is covered without prior approval however his co-pay was over $800, he will let me know if he would like me to send the [facility].    ____________________________________________ Ihor Austin. Benjamin Stain, M.D., ABFM., CAQSM., AME. Primary Care and Sports Medicine [facility]  Adjunct Professor of Family Medicine  [place] [employer] [facility] [employer] [facility] [employer]  Restaurant manager, fast food

## 2023-09-30 NOTE — Assessment & Plan Note (Signed)
 Pleasant 63 year old male, he had a viral illness with chills and coughing approximately 2 to 3 weeks ago, the viral illness has resolved, he feels a lot better but does have a persistent cough that is keeping him up at night. [place], so he cannot have narcotics in the system. Exam is completely normal today. Considering duration of symptoms we will proceed with azithromycin, he will do Delsym over-the-counter and follow-up with me as needed.

## 2023-11-26 ENCOUNTER — Encounter (INDEPENDENT_AMBULATORY_CARE_PROVIDER_SITE_OTHER): Payer: PRIVATE HEALTH INSURANCE | Admitting: Sports Medicine

## 2023-11-26 DIAGNOSIS — E6609 Other obesity due to excess calories: Secondary | ICD-10-CM | POA: Diagnosis not present

## 2023-11-26 DIAGNOSIS — G4719 Other hypersomnia: Secondary | ICD-10-CM

## 2023-11-26 MED ORDER — TIRZEPATIDE 10 MG/0.5ML ~~LOC~~ SOAJ: SUBCUTANEOUS | 11 refills | Status: AC

## 2023-12-19 DIAGNOSIS — G4719 Other hypersomnia: Secondary | ICD-10-CM | POA: Insufficient documentation

## 2023-12-19 DIAGNOSIS — G4733 Obstructive sleep apnea (adult) (pediatric): Secondary | ICD-10-CM | POA: Insufficient documentation

## 2023-12-19 NOTE — Addendum Note (Signed)
 Addended by: Gean Keels on: 12/19/2023 05:07 PM   Modules accepted: Orders

## 2023-12-19 NOTE — Telephone Encounter (Signed)

## 2023-12-19 NOTE — Assessment & Plan Note (Signed)
 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  Diagnosis: Excessive daytime sleepiness, obstructive sleep apnea, STOP-BANG score = 6.

## 2024-01-06 ENCOUNTER — Other Ambulatory Visit: Payer: Self-pay | Admitting: Sports Medicine

## 2024-01-06 DIAGNOSIS — E783 Hyperchylomicronemia: Secondary | ICD-10-CM

## 2024-01-14 ENCOUNTER — Encounter: Payer: Self-pay | Admitting: Sports Medicine

## 2024-01-16 ENCOUNTER — Ambulatory Visit (INDEPENDENT_AMBULATORY_CARE_PROVIDER_SITE_OTHER): Payer: Self-pay | Admitting: Sports Medicine

## 2024-01-16 ENCOUNTER — Ambulatory Visit: Payer: Self-pay

## 2024-01-16 VITALS — BP 133/79 | HR 86 | Ht 68.0 in | Wt 247.0 lb

## 2024-01-16 DIAGNOSIS — G4733 Obstructive sleep apnea (adult) (pediatric): Secondary | ICD-10-CM

## 2024-01-16 DIAGNOSIS — M5412 Radiculopathy, cervical region: Secondary | ICD-10-CM

## 2024-01-16 DIAGNOSIS — E6609 Other obesity due to excess calories: Secondary | ICD-10-CM

## 2024-01-16 MED ORDER — PREDNISONE 50 MG PO TABS: ORAL_TABLET | ORAL | 0 refills | Status: DC

## 2024-01-16 NOTE — Progress Notes (Signed)
    Procedures performed today:    None.  Independent interpretation of notes and tests performed by another provider:   None.  Brief History, Exam, Impression, and Recommendations:    Cervical radiculopathy New job, increasing discomfort right arm numbness tingling down to the 2nd and 3rd fingers consistent with right C7 radiculopathy, adding 5 days of prednisone , x-rays, home physical therapy, return in 6 weeks as needed.  Mild obstructive sleep apnea Mild OSA, he does need a letter from me saying he is treated and safe to drive. He will continue his tirzepatide  which will also help with his sleep apnea.  Obesity Approximate 10 pound weight loss, currently on 50 mg tirzepatide  compounded. Continue dose titration as needed, return to see me 6 months.  Richard Gross did lose his insurance, he will be self-pay, we will undercode these visits.  ____________________________________________ Debby PARAS. Curtis, M.D., ABFM., CAQSM., AME. Primary Care and Sports Medicine [facility]  Adjunct Professor of [facility] Medicine  [employer]  Restaurant manager, fast food

## 2024-01-16 NOTE — Assessment & Plan Note (Signed)
 New job, increasing discomfort right arm numbness tingling down to the 2nd and 3rd fingers consistent with right C7 radiculopathy, adding 5 days of prednisone , x-rays, home physical therapy, return in 6 weeks as needed.

## 2024-01-16 NOTE — Assessment & Plan Note (Signed)
 Approximate 10 pound weight loss, currently on 50 mg tirzepatide  compounded. Continue dose titration as needed, return to see me 6 months.

## 2024-01-16 NOTE — Assessment & Plan Note (Signed)
 Mild OSA, he does need a letter from me saying he is treated and safe to drive. He will continue his tirzepatide  which will also help with his sleep apnea.

## 2024-01-18 ENCOUNTER — Other Ambulatory Visit: Payer: Self-pay | Admitting: Sports Medicine

## 2024-01-18 DIAGNOSIS — N529 Male erectile dysfunction, unspecified: Secondary | ICD-10-CM

## 2024-01-20 ENCOUNTER — Ambulatory Visit: Payer: Self-pay | Admitting: Sports Medicine

## 2024-01-23 ENCOUNTER — Encounter (INDEPENDENT_AMBULATORY_CARE_PROVIDER_SITE_OTHER): Payer: Self-pay | Admitting: Sports Medicine

## 2024-01-23 DIAGNOSIS — M5412 Radiculopathy, cervical region: Secondary | ICD-10-CM

## 2024-01-25 MED ORDER — PREDNISONE 10 MG (48) PO TBPK: ORAL_TABLET | Freq: Every day | ORAL | 0 refills | Status: DC

## 2024-01-25 NOTE — Telephone Encounter (Signed)

## 2024-02-07 ENCOUNTER — Telehealth: Payer: Self-pay

## 2024-02-07 DIAGNOSIS — M5412 Radiculopathy, cervical region: Secondary | ICD-10-CM

## 2024-02-07 MED ORDER — HYDROCODONE-ACETAMINOPHEN 10-325 MG PO TABS: 1.0000 | ORAL_TABLET | Freq: Three times a day (TID) | ORAL | 0 refills | Status: DC | PRN

## 2024-02-07 MED ORDER — GABAPENTIN 300 MG PO CAPS: ORAL_CAPSULE | ORAL | 3 refills | Status: DC

## 2024-02-07 NOTE — Telephone Encounter (Signed)
 Left a message letting the patient know that hydrocodone  and gabapentin  was added.

## 2024-02-07 NOTE — Telephone Encounter (Signed)
 Adding a course of hydrocodone , as well as gabapentin .

## 2024-02-07 NOTE — Telephone Encounter (Signed)
 Patients wife called in and stated that he is in severe pain.He has been seen by Dr.T for his right shoulder pain and wanted to know if you could send in a prescription for pain. The prednisone  isn't working and he's taking tylenol  as well. Please advise?

## 2024-02-10 NOTE — Telephone Encounter (Signed)
 Attempted to contact patient regarding medications. Unfortunately was sent to voicemail. Left voice message requesting a call back to our office.

## 2024-02-10 NOTE — Telephone Encounter (Signed)
 Patient advised. 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

## 2024-02-13 ENCOUNTER — Ambulatory Visit (INDEPENDENT_AMBULATORY_CARE_PROVIDER_SITE_OTHER): Payer: PRIVATE HEALTH INSURANCE | Admitting: Sports Medicine

## 2024-02-13 DIAGNOSIS — M5412 Radiculopathy, cervical region: Secondary | ICD-10-CM

## 2024-02-13 NOTE — Progress Notes (Signed)
    Procedures performed today:    None.  Independent interpretation of notes and tests performed by another provider:   None.  Brief History, Exam, Impression, and Recommendations:    Cervical radiculopathy Pleasant 63 year old male, right arm numbness and tingling down to the 2nd and 3rd fingers consistent with a right C7 radiculopathy, we treated him with 5 days of prednisone , x-rays, home PT, he was unable to take his opiate due due to DOT reasons. We discussed treatment protocol, I explained to him that as he was already taking neuropathic agents, NSAIDs, he had already had steroids, the neck step was MRI and epidural, he would like to hold off on MRI and epidural for now and do some chiropractic manipulation. He will do his home physical therapy, and return to see me in 6 weeks or so.  I spent 30 minutes of total time managing this patient today, this includes chart review, face to face, and non-face to face time.  ____________________________________________ Debby PARAS. Curtis, M.D., ABFM., CAQSM., AME. Primary Care and Sports Medicine [facility]  Adjunct Professor of [facility] Medicine  [employer]  Restaurant manager, fast food

## 2024-02-13 NOTE — Assessment & Plan Note (Signed)
 Pleasant 63 year old male, right arm numbness and tingling down to the 2nd and 3rd fingers consistent with a right C7 radiculopathy, we treated him with 5 days of prednisone , x-rays, home PT, he was unable to take his opiate due due to DOT reasons. We discussed treatment protocol, I explained to him that as he was already taking neuropathic agents, NSAIDs, he had already had steroids, the neck step was MRI and epidural, he would like to hold off on MRI and epidural for now and do some chiropractic manipulation. He will do his home physical therapy, and return to see me in 6 weeks or so.

## 2024-03-03 ENCOUNTER — Other Ambulatory Visit: Payer: Self-pay | Admitting: Sports Medicine

## 2024-03-03 DIAGNOSIS — E783 Hyperchylomicronemia: Secondary | ICD-10-CM

## 2024-03-17 ENCOUNTER — Encounter: Payer: Self-pay | Admitting: Sports Medicine

## 2024-03-19 ENCOUNTER — Encounter: Payer: PRIVATE HEALTH INSURANCE | Admitting: Sports Medicine

## 2024-06-10 ENCOUNTER — Telehealth: Payer: Self-pay | Admitting: Sports Medicine

## 2024-06-10 NOTE — Telephone Encounter (Signed)
 Pt is requesting refill on the following meds;  ezetimibe  (ZETIA ) 10 MG tablet  fenofibrate  160 MG tablet  methocarbamol  (ROBAXIN ) 500 MG tablet  senna-docusate (SENOKOT-S) 8.6-50 MG tablet gabapentin  (NEURONTIN ) 300 MG capsule  tadalafil  (CIALIS ) 5 MG tablet   Patient is requesting Rx be sent to   [facility] - [place], [place] - [address] Phone: [phone]  Fax: [phone]     Pt is currently self pay and will set up physical/TOC in the beginning of the year. Please advise.

## 2024-06-17 ENCOUNTER — Other Ambulatory Visit: Payer: Self-pay | Admitting: Physician Assistant

## 2024-06-17 DIAGNOSIS — N529 Male erectile dysfunction, unspecified: Secondary | ICD-10-CM

## 2024-06-17 DIAGNOSIS — M5412 Radiculopathy, cervical region: Secondary | ICD-10-CM

## 2024-06-17 DIAGNOSIS — E783 Hyperchylomicronemia: Secondary | ICD-10-CM

## 2024-06-17 MED ORDER — TADALAFIL 5 MG PO TABS: 5.0000 mg | ORAL_TABLET | Freq: Every day | ORAL | 0 refills | Status: DC

## 2024-06-17 MED ORDER — FENOFIBRATE 160 MG PO TABS: 160.0000 mg | ORAL_TABLET | Freq: Every day | ORAL | 0 refills | Status: DC

## 2024-06-17 MED ORDER — GABAPENTIN 300 MG PO CAPS: ORAL_CAPSULE | ORAL | 0 refills | Status: AC

## 2024-06-17 MED ORDER — METHOCARBAMOL 500 MG PO TABS: 500.0000 mg | ORAL_TABLET | Freq: Four times a day (QID) | ORAL | 0 refills | Status: AC | PRN

## 2024-06-17 MED ORDER — EZETIMIBE 10 MG PO TABS: 10.0000 mg | ORAL_TABLET | Freq: Every day | ORAL | 0 refills | Status: DC

## 2024-06-17 NOTE — Progress Notes (Signed)
 Pt requested one month refill for medications until he could find another PCP. Refills sent.

## 2024-06-19 NOTE — Telephone Encounter (Signed)
 Refills completed

## 2024-07-13 ENCOUNTER — Other Ambulatory Visit: Payer: Self-pay | Admitting: Physician Assistant

## 2024-07-13 DIAGNOSIS — N529 Male erectile dysfunction, unspecified: Secondary | ICD-10-CM

## 2024-07-13 DIAGNOSIS — E783 Hyperchylomicronemia: Secondary | ICD-10-CM

## 2024-07-17 ENCOUNTER — Encounter: Payer: Self-pay | Admitting: Podiatry

## 2024-07-17 ENCOUNTER — Ambulatory Visit: Payer: Self-pay | Admitting: Podiatry

## 2024-07-17 DIAGNOSIS — L603 Nail dystrophy: Secondary | ICD-10-CM

## 2024-07-17 DIAGNOSIS — L600 Ingrowing nail: Secondary | ICD-10-CM | POA: Diagnosis not present

## 2024-07-17 NOTE — Progress Notes (Signed)
"  °  Subjective:  Patient ID: Richard Gross, male    DOB: 1960-10-27,   MRN: [id number]  Chief Complaint  Patient presents with   Nail Problem    The toenail she removed one other time has come back with a vengeance.  It's hardly attached. (Hallux left)       64 y.o. male presents for return of left hallux nail. Had it removed a couple years ago and has come back and causing pain again. Hoping to have it removed again. Denies any other pedal complaints. Denies n/v/f/c.   Past Medical History:  Diagnosis Date   Complication of anesthesia    History of kidney stones    PONV (postoperative nausea and vomiting)     Objective:  Physical Exam: Vascular: DP/PT pulses 2/4 bilateral. CFT <3 seconds. Normal hair growth on digits. No edema.  Skin. No lacerations or abrasions bilateral feet. Thickened discolored left great toe with incurvation of the lateral and medial nail borders. Nail loosened and attached at the very proximal aspect of the nail.  Musculoskeletal: MMT 5/5 bilateral lower extremities in DF, PF, Inversion and Eversion. Deceased ROM in DF of ankle joint.  Neurological: Sensation intact to light touch.   Assessment:   1. Ingrown left greater toenail   2. Onychodystrophy       Plan:  Patient was evaluated and treated and all questions answered. Patient requesting removal of ingrown nail today. Procedure below.  Discussed procedure and post procedure care and patient expressed understanding.  Will follow-up in 2 weeks for nail check or sooner if any problems arise.    Procedure:  Procedure: total Nail Avulsion of left hallux  Surgeon: Asberry Failing, DPM  Pre-op Dx: Ingrown toenail without infection Post-op: Same  Place of Surgery: Office exam room.  Indications for surgery: Painful and ingrown toenail.    The patient is requesting removal of nail with chemical matrixectomy. Risks and complications were discussed with the patient for which they understand and written  consent was obtained. Under sterile conditions a total of 3 mL of 1:1 mixture 0.5% marcaine  plain and 1% lidocaine  plain was infiltrated in a hallux block fashion. Once anesthetized, the skin was prepped in sterile fashion. A tourniquet was then applied. Next the left hallux nail was removed.  Next phenol was then applied under standard conditions and copiously irrigated. Silvadene was applied. A dry sterile dressing was applied. After application of the dressing the tourniquet was removed and there is found to be an immediate capillary refill time to the digit. The patient tolerated the procedure well without any complications. Post procedure instructions were discussed the patient for which he verbally understood. Follow-up in two weeks for nail check or sooner if any problems are to arise. Discussed signs/symptoms of infection and directed to call the office immediately should any occur or go directly to the emergency room. In the meantime, encouraged to call the office with any questions, concerns, changes symptoms.   Asberry Failing, DPM    "

## 2024-07-17 NOTE — Patient Instructions (Signed)
